# Patient Record
Sex: Female | Born: 1955
Health system: Southern US, Community
[De-identification: ages and names within clinical notes are randomized; demographics above are authoritative.]

## PROBLEM LIST (undated history)

## (undated) DIAGNOSIS — E119 Type 2 diabetes mellitus without complications: Secondary | ICD-10-CM

## (undated) DIAGNOSIS — M1990 Unspecified osteoarthritis, unspecified site: Secondary | ICD-10-CM

## (undated) DIAGNOSIS — I10 Essential (primary) hypertension: Secondary | ICD-10-CM

## (undated) DIAGNOSIS — R51 Headache: Secondary | ICD-10-CM

## (undated) DIAGNOSIS — K219 Gastro-esophageal reflux disease without esophagitis: Secondary | ICD-10-CM

## (undated) DIAGNOSIS — R519 Headache, unspecified: Secondary | ICD-10-CM

## (undated) HISTORY — PX: ABDOMINAL HYSTERECTOMY: SHX81

## (undated) HISTORY — DX: Type 2 diabetes mellitus without complications: E11.9

## (undated) HISTORY — PX: BLEPHAROPLASTY: SUR158

## (undated) HISTORY — DX: Gastro-esophageal reflux disease without esophagitis: K21.9

## (undated) HISTORY — PX: HEMORRHOID SURGERY: SHX153

## (undated) HISTORY — DX: Essential (primary) hypertension: I10

---

## 1999-08-30 ENCOUNTER — Other Ambulatory Visit: Admission: RE | Admit: 1999-08-30 | Discharge: 1999-08-30 | Payer: Self-pay | Admitting: Family Medicine

## 2003-06-20 ENCOUNTER — Encounter: Payer: Self-pay | Admitting: Emergency Medicine

## 2003-06-20 ENCOUNTER — Emergency Department (HOSPITAL_COMMUNITY): Admission: EM | Admit: 2003-06-20 | Discharge: 2003-06-20 | Payer: Self-pay | Admitting: Emergency Medicine

## 2003-08-30 ENCOUNTER — Encounter (HOSPITAL_COMMUNITY): Admission: RE | Admit: 2003-08-30 | Discharge: 2003-09-29 | Payer: Self-pay | Admitting: Preventative Medicine

## 2003-12-01 ENCOUNTER — Ambulatory Visit (HOSPITAL_COMMUNITY): Admission: RE | Admit: 2003-12-01 | Discharge: 2003-12-01 | Payer: Self-pay | Admitting: Preventative Medicine

## 2006-01-22 ENCOUNTER — Other Ambulatory Visit: Admission: RE | Admit: 2006-01-22 | Discharge: 2006-01-22 | Payer: Self-pay | Admitting: Family Medicine

## 2012-09-09 LAB — HEMOGLOBIN A1C: Hgb A1c MFr Bld: 6 % (ref 4.0–6.0)

## 2012-09-09 LAB — LIPID PANEL
Cholesterol: 201 mg/dL — AB (ref 0–200)
HDL: 57 mg/dL (ref 35–70)
LDL Cholesterol: 128 mg/dL
Triglycerides: 82 mg/dL (ref 40–160)

## 2012-09-09 LAB — BASIC METABOLIC PANEL
BUN: 13 mg/dL (ref 4–21)
Creatinine: 0.9 mg/dL (ref 0.5–1.1)

## 2012-10-04 ENCOUNTER — Encounter: Payer: Self-pay | Admitting: Family Medicine

## 2012-10-04 ENCOUNTER — Ambulatory Visit (INDEPENDENT_AMBULATORY_CARE_PROVIDER_SITE_OTHER): Payer: 59 | Admitting: Family Medicine

## 2012-10-04 VITALS — BP 130/84 | HR 74 | Resp 15 | Ht 60.0 in | Wt 159.1 lb

## 2012-10-04 DIAGNOSIS — I10 Essential (primary) hypertension: Secondary | ICD-10-CM | POA: Insufficient documentation

## 2012-10-04 DIAGNOSIS — E119 Type 2 diabetes mellitus without complications: Secondary | ICD-10-CM

## 2012-10-04 DIAGNOSIS — Z720 Tobacco use: Secondary | ICD-10-CM | POA: Insufficient documentation

## 2012-10-04 DIAGNOSIS — E669 Obesity, unspecified: Secondary | ICD-10-CM

## 2012-10-04 DIAGNOSIS — F17200 Nicotine dependence, unspecified, uncomplicated: Secondary | ICD-10-CM

## 2012-10-04 DIAGNOSIS — E1169 Type 2 diabetes mellitus with other specified complication: Secondary | ICD-10-CM | POA: Insufficient documentation

## 2012-10-04 NOTE — Assessment & Plan Note (Signed)
Continue oral meds, obtain recent labs and records, note not on ACEI or statin therapy

## 2012-10-04 NOTE — Assessment & Plan Note (Signed)
Discussed use of medications vs patches, need for cessation

## 2012-10-04 NOTE — Assessment & Plan Note (Signed)
BP looks good today, no change to meds 

## 2012-10-04 NOTE — Patient Instructions (Addendum)
Calcium 1000mg  and Vit D 800IU Continue current medications I recommend 30 minutes of exercise 5 days a week Call and get your Mammogram  I will get records  Podiatry referral  F/U 3 months

## 2012-10-04 NOTE — Progress Notes (Signed)
  Subjective:    Patient ID: Danielle Hicks, female    DOB: 01/20/1956, 56 y.o.   MRN: [id number]  HPI Pt here to establish care previous PCP Dr. Ocie Bob Medications and history reviewed DM for approx 7 years, on oral medication, had labs done 3 weeks ago, does not know A1C, states well controlled HTN- for past 15 ears on medication for about 10 years, well controlled per report No hyperlipidemia, Heart disease, or lung disease Due for Mammogram at [facility] in [place] +colonoscopy,,, +flu shot States previous PCP would not complete FMLA papers because she is "fatigued" works at [employer]  Review of Systems GEN- denies fatigue, fever, weight loss,weakness, recent illness HEENT- denies eye drainage, change in vision, nasal discharge, CVS- denies chest pain, palpitations RESP- denies SOB, cough, wheeze ABD- denies N/V, change in stools, abd pain GU- denies dysuria, hematuria, dribbling, incontinence MSK- denies joint pain, muscle aches, injury Neuro- denies headache, dizziness, syncope, seizure activity      Objective:   Physical Exam GEN- NAD, alert and oriented x3 HEENT- PERRL, EOMI, non injected sclera, pink conjunctiva, MMM, oropharynx clear Neck- Supple,  CVS- RRR, no murmur RESP-CTAB ABS-NABS,soft,NT,ND  EXT- No edema Pulses- Radial, DP- 2+ Diabetic foot- thickned nails, dry peeling feet, normal monofilament  Psych-normal affect and mood        Assessment & Plan:

## 2012-10-11 ENCOUNTER — Encounter: Payer: Self-pay | Admitting: Family Medicine

## 2012-12-29 ENCOUNTER — Ambulatory Visit: Payer: 59 | Admitting: Family Medicine

## 2012-12-31 ENCOUNTER — Encounter: Payer: Self-pay | Admitting: Family Medicine

## 2012-12-31 ENCOUNTER — Other Ambulatory Visit: Payer: Self-pay | Admitting: Family Medicine

## 2012-12-31 ENCOUNTER — Ambulatory Visit (INDEPENDENT_AMBULATORY_CARE_PROVIDER_SITE_OTHER): Payer: 59 | Admitting: Family Medicine

## 2012-12-31 VITALS — BP 130/88 | HR 65 | Resp 16 | Wt 144.4 lb

## 2012-12-31 DIAGNOSIS — F339 Major depressive disorder, recurrent, unspecified: Secondary | ICD-10-CM

## 2012-12-31 DIAGNOSIS — E119 Type 2 diabetes mellitus without complications: Secondary | ICD-10-CM

## 2012-12-31 MED ORDER — ZOLPIDEM TARTRATE 10 MG PO TABS: 10.0000 mg | ORAL_TABLET | Freq: Every evening | ORAL | Status: DC | PRN

## 2012-12-31 MED ORDER — ESCITALOPRAM OXALATE 10 MG PO TABS: 10.0000 mg | ORAL_TABLET | Freq: Every day | ORAL | Status: DC

## 2012-12-31 NOTE — Patient Instructions (Signed)
Start the lexapro once a day  Use the Ambien at bedtime as needed, take 1 hour before sleep  F/U 4 weeks

## 2012-12-31 NOTE — Progress Notes (Signed)
  Subjective:    Patient ID: Danielle Hicks, female    DOB: [birth year], 57 y.o.   MRN: [id number]  HPI  Patient presents secondary to depression. She is history depression on and off in the past was on Lexapro because she had difficulty dealing with some situations at work. She weaned herself off some time ago. She presents today with a very unique situation. She is history of chronic bags beneath her eyes which appear to have been a family trait she decided to go ahead and have the surgery to have this corrected this was approximately 6 weeks ago. Since then she realized that she would have to return to work and she was fearful of what others would think about her because she had surgery to correct this problem. She's had sleepless nights anxiety and is felt very depressed she has lost 15 pounds due to not eating. She is worried that people think that she is pain and that is why she had it done she actually now wants to come back because they were a security blanket for her low self-esteem and she has something to blame it on. He started seeing a therapist on her own in [place] she's had 2 visits and they're helping her with herself was seen Review of Systems  GEN- denies fatigue, fever, weight loss,weakness, recent illness HEENT- denies eye drainage, change in vision, nasal discharge, CVS- denies chest pain, palpitations RESP- denies SOB, cough, wheeze ABD- denies N/V, change in stools, abd pain GU- denies dysuria, hematuria, dribbling, incontinence MSK- denies joint pain, muscle aches, injury Neuro- denies headache, dizziness, syncope, seizure activity      Objective:   Physical Exam  GEN-NAD,alert and oriented x 3, + 15lb weight loss Psych- not overly anxious or depressed, good eye contact, No Hallucinations, no SI  [id number]    Assessment & Plan:

## 2013-01-02 DIAGNOSIS — F339 Major depressive disorder, recurrent, unspecified: Secondary | ICD-10-CM | POA: Insufficient documentation

## 2013-01-02 NOTE — Assessment & Plan Note (Signed)
Will restart lexapro 10mg , contracted for safety, following with therapy for her self esteem and coping mechanisms F/U per below  Approx 20 minutes spent with patient > 50% spent on counseling and medication

## 2013-01-04 ENCOUNTER — Ambulatory Visit: Payer: 59 | Admitting: Family Medicine

## 2013-01-04 LAB — BASIC METABOLIC PANEL
BUN: 6 mg/dL (ref 6–23)
CO2: 29 mEq/L (ref 19–32)
Calcium: 9.7 mg/dL (ref 8.4–10.5)
Chloride: 103 mEq/L (ref 96–112)
Creat: 0.85 mg/dL (ref 0.50–1.10)
Glucose, Bld: 99 mg/dL (ref 70–99)
Potassium: 3.3 mEq/L — ABNORMAL LOW (ref 3.5–5.3)
Sodium: 143 mEq/L (ref 135–145)

## 2013-01-04 LAB — HEMOGLOBIN A1C
Hgb A1c MFr Bld: 6.2 % — ABNORMAL HIGH (ref <5.7)
Mean Plasma Glucose: 131 mg/dL — ABNORMAL HIGH (ref <117)

## 2013-01-06 ENCOUNTER — Encounter: Payer: Self-pay | Admitting: Family Medicine

## 2013-01-06 ENCOUNTER — Ambulatory Visit (INDEPENDENT_AMBULATORY_CARE_PROVIDER_SITE_OTHER): Payer: 59 | Admitting: Family Medicine

## 2013-01-06 VITALS — BP 130/76 | HR 84 | Resp 18 | Ht 60.0 in | Wt 146.1 lb

## 2013-01-06 DIAGNOSIS — N3000 Acute cystitis without hematuria: Secondary | ICD-10-CM

## 2013-01-06 DIAGNOSIS — N390 Urinary tract infection, site not specified: Secondary | ICD-10-CM

## 2013-01-06 DIAGNOSIS — R252 Cramp and spasm: Secondary | ICD-10-CM

## 2013-01-06 LAB — POCT URINALYSIS DIPSTICK
Blood, UA: NEGATIVE
Glucose, UA: NEGATIVE
Leukocytes, UA: NEGATIVE
Nitrite, UA: NEGATIVE
Protein, UA: NEGATIVE
Spec Grav, UA: 1.025
Urobilinogen, UA: 0.2
pH, UA: 6.5

## 2013-01-06 MED ORDER — CYCLOBENZAPRINE HCL 10 MG PO TABS: 10.0000 mg | ORAL_TABLET | Freq: Every evening | ORAL | Status: DC | PRN

## 2013-01-06 MED ORDER — CEPHALEXIN 500 MG PO CAPS: 500.0000 mg | ORAL_CAPSULE | Freq: Two times a day (BID) | ORAL | Status: AC

## 2013-01-06 NOTE — Patient Instructions (Addendum)
Take the antibiotic twice a day for urine infection Water and cranberry juice Take the aleve twice a day  F/U as previous

## 2013-01-06 NOTE — Progress Notes (Signed)
  Subjective:    Patient ID: Danielle Hicks, female    DOB: 07/01/1956, 57 y.o.   MRN: [id number]  HPI  History presents with for ER followup she was seen in the ER about a week ago secondary to some numbness she felt on her right lateral side it went away until last night he came back with numbness and some spasms in her leg. She denies any change in bowel or bladder or any injury. She's also noticed that she's had urinary frequency and burning with urination for the past few days. She does murmur her symptoms and her leg started after working out heart on the elliptical Review of Systems - per above  GEN- denies fatigue, fever, weight loss,weakness, recent illness HEENT- denies eye drainage, change in vision, nasal discharge, CVS- denies chest pain, palpitations RESP- denies SOB, cough, wheeze ABD- denies N/V, change in stools, abd pain GU- + dysuria, hematuria, dribbling, incontinence MSK- + joint pain, muscle aches, injury Neuro- denies headache, dizziness, syncope, seizure activity      Objective:   Physical Exam GEN- NAD, alert and oriented x3 CVS- RRR, no murmur RESP-CTAB ABD-NABS,soft,NT,ND, no CVA tenderness EXT- No edema Pulses- Radial, DP- 2+ MSK- normal HIP rotation, spine NT, neg SLR, no spasm Skin- Thighs-normal Neuro- sensation grossly in tact, motor equal bilaterl LE       Assessment & Plan:

## 2013-01-07 DIAGNOSIS — N390 Urinary tract infection, site not specified: Secondary | ICD-10-CM | POA: Insufficient documentation

## 2013-01-07 DIAGNOSIS — R252 Cramp and spasm: Secondary | ICD-10-CM | POA: Insufficient documentation

## 2013-01-07 MED ORDER — METFORMIN HCL 500 MG PO TABS: 500.0000 mg | ORAL_TABLET | Freq: Two times a day (BID) | ORAL | Status: DC

## 2013-01-07 NOTE — Addendum Note (Signed)
Addended by: Milinda Antis F on: 01/07/2013 08:59 AM   Modules accepted: Orders

## 2013-01-07 NOTE — Assessment & Plan Note (Signed)
Multiple symptoms with a very small area of numbness as well as cramping and spasms after she had started working out. I think this is all muscle skeletal pain she has no signs of neuropathy no back pain. Will have her use muscle relaxants and some anti-inflammatories

## 2013-01-07 NOTE — Assessment & Plan Note (Signed)
We'll treat with antibiotics fluids cranberry juice

## 2013-02-04 ENCOUNTER — Ambulatory Visit: Payer: 59 | Admitting: Family Medicine

## 2013-03-23 ENCOUNTER — Other Ambulatory Visit: Payer: Self-pay | Admitting: Family Medicine

## 2013-03-25 ENCOUNTER — Other Ambulatory Visit: Payer: Self-pay

## 2013-03-25 MED ORDER — AMLODIPINE BESYLATE 10 MG PO TABS: 10.0000 mg | ORAL_TABLET | Freq: Every day | ORAL | Status: DC

## 2013-03-25 MED ORDER — HYDROCHLOROTHIAZIDE 25 MG PO TABS: 25.0000 mg | ORAL_TABLET | Freq: Every day | ORAL | Status: DC

## 2013-06-08 ENCOUNTER — Other Ambulatory Visit: Payer: Self-pay | Admitting: Family Medicine

## 2013-06-09 NOTE — Telephone Encounter (Signed)
Meds refilled.

## 2013-07-04 ENCOUNTER — Telehealth: Payer: Self-pay | Admitting: Family Medicine

## 2013-07-04 MED ORDER — SITAGLIPTIN PHOS-METFORMIN HCL 50-500 MG PO TABS: ORAL_TABLET | ORAL | Status: DC

## 2013-07-04 NOTE — Telephone Encounter (Signed)
Rx Refilled  

## 2013-07-06 ENCOUNTER — Ambulatory Visit (INDEPENDENT_AMBULATORY_CARE_PROVIDER_SITE_OTHER): Payer: 59 | Admitting: Family Medicine

## 2013-07-06 ENCOUNTER — Encounter: Payer: Self-pay | Admitting: Family Medicine

## 2013-07-06 VITALS — BP 120/80 | HR 80 | Temp 99.3°F | Resp 20 | Wt 148.0 lb

## 2013-07-06 DIAGNOSIS — F339 Major depressive disorder, recurrent, unspecified: Secondary | ICD-10-CM

## 2013-07-06 DIAGNOSIS — F17200 Nicotine dependence, unspecified, uncomplicated: Secondary | ICD-10-CM

## 2013-07-06 DIAGNOSIS — E119 Type 2 diabetes mellitus without complications: Secondary | ICD-10-CM

## 2013-07-06 DIAGNOSIS — Z720 Tobacco use: Secondary | ICD-10-CM

## 2013-07-06 DIAGNOSIS — I10 Essential (primary) hypertension: Secondary | ICD-10-CM

## 2013-07-06 DIAGNOSIS — B353 Tinea pedis: Secondary | ICD-10-CM

## 2013-07-06 LAB — CBC
HCT: 36.3 % (ref 36.0–46.0)
Hemoglobin: 12.2 g/dL (ref 12.0–15.0)
MCH: 26.3 pg (ref 26.0–34.0)
MCHC: 33.6 g/dL (ref 30.0–36.0)
MCV: 78.2 fL (ref 78.0–100.0)
Platelets: 341 10*3/uL (ref 150–400)
RBC: 4.64 MIL/uL (ref 3.87–5.11)
RDW: 17.2 % — ABNORMAL HIGH (ref 11.5–15.5)
WBC: 8.7 10*3/uL (ref 4.0–10.5)

## 2013-07-06 LAB — BASIC METABOLIC PANEL
BUN: 15 mg/dL (ref 6–23)
CO2: 27 mEq/L (ref 19–32)
Calcium: 9.3 mg/dL (ref 8.4–10.5)
Chloride: 105 mEq/L (ref 96–112)
Creat: 0.77 mg/dL (ref 0.50–1.10)
Glucose, Bld: 72 mg/dL (ref 70–99)
Potassium: 3.6 mEq/L (ref 3.5–5.3)
Sodium: 140 mEq/L (ref 135–145)

## 2013-07-06 LAB — HEMOGLOBIN A1C, FINGERSTICK: Hgb A1C (fingerstick): 5.7 % (ref <5.7)

## 2013-07-06 MED ORDER — CLOTRIMAZOLE-BETAMETHASONE 1-0.05 % EX CREA: TOPICAL_CREAM | Freq: Two times a day (BID) | CUTANEOUS | Status: DC

## 2013-07-06 MED ORDER — METFORMIN HCL ER 500 MG PO TB24: 500.0000 mg | ORAL_TABLET | Freq: Every day | ORAL | Status: DC

## 2013-07-06 MED ORDER — LISINOPRIL-HYDROCHLOROTHIAZIDE 10-12.5 MG PO TABS: 1.0000 | ORAL_TABLET | Freq: Every day | ORAL | Status: DC

## 2013-07-06 MED ORDER — AMLODIPINE BESYLATE 10 MG PO TABS: 10.0000 mg | ORAL_TABLET | Freq: Every day | ORAL | Status: DC

## 2013-07-06 MED ORDER — LEXAPRO 10 MG PO TABS: 10.0000 mg | ORAL_TABLET | Freq: Every day | ORAL | Status: DC

## 2013-07-06 NOTE — Assessment & Plan Note (Signed)
She is cutting back, continue to work on cessation

## 2013-07-06 NOTE — Progress Notes (Signed)
  Subjective:    Patient ID: Danielle Hicks, female    DOB: [birth year], 57 y.o.   MRN: [id number]  HPI  Pt here to f/u chronic medical problems.  Rash on bilateral feet for past month or so, began after getting water in her work boots, has used cortisone for itching but has not cleared the rash.  DM- taking Janumet, did not know she was to be on Metformin instead, last A1C 6.2%, has [place] appt with podiatry Depression- continues to have difficulty with recognizing herself since her bags were removed from her eyes, she wants to try lexapro again, at the time she started had sleeping pill and muscle relaxants and was very drowsy and dizzy, she was not sure which med was causing the problem so stopped all of them. Only took 1 week of medications. Asked for Brand Name Lexapro. She is also going to start therapy again.    Review of Systems   GEN- denies fatigue, fever, weight loss,weakness, recent illness HEENT- denies eye drainage, change in vision, nasal discharge, CVS- denies chest pain, palpitations RESP- denies SOB, cough, wheeze ABD- denies N/V, change in stools, abd pain GU- denies dysuria, hematuria, dribbling, incontinence MSK- denies joint pain, muscle aches, injury Neuro- denies headache, dizziness, syncope, seizure activity      Objective:   Physical Exam GEN- NAD, alert and oriented x3 HEENT- PERRL, EOMI, non injected sclera, pink conjunctiva, MMM, oropharynx clear Neck- Supple,  CVS- RRR, no murmur RESP-CTAB ABD-NABS,soft,NT,ND EXT- No edema Pulses- Radial, DP- 2+ Skin- erythematous macular rash bilteral feet with flaking areas, no lesions in web spaces Psych- normal affect and mood        Assessment & Plan:

## 2013-07-06 NOTE — Assessment & Plan Note (Signed)
lotrisone BID

## 2013-07-06 NOTE — Patient Instructions (Addendum)
Start lisinopril HCTZ- after you run out of your regular HCTZ 25mg  Continue norvasc  Lexapro sent as Brand Name Call and schedule appt with therapist Stop Janumet Take Metformin 1 tablet once a day  Apply cream to foot twice a day  F/U 6 weeks

## 2013-07-06 NOTE — Assessment & Plan Note (Signed)
Lexpro to be restarted, she also has list of therapist she received from work

## 2013-07-06 NOTE — Assessment & Plan Note (Signed)
Overcorrected, she  Never received the change to plain metformin Will d/c janumet Metformin 500MG  ER daily Urine Micro Check renal function

## 2013-07-06 NOTE — Assessment & Plan Note (Signed)
Well controllled, will D/C HCTZ, start lisinopril HCTZ, needs ACEI

## 2013-07-07 ENCOUNTER — Telehealth: Payer: Self-pay | Admitting: Family Medicine

## 2013-07-07 NOTE — Telephone Encounter (Signed)
Called pharmacy and asked them to change to generic name of Lexapro

## 2013-07-07 NOTE — Telephone Encounter (Signed)
Name brand lexapro cost the pt $168. Do you really want name brand?

## 2013-07-15 ENCOUNTER — Telehealth: Payer: Self-pay | Admitting: Family Medicine

## 2013-07-15 NOTE — Telephone Encounter (Signed)
Called pt and she was concerned if she should take her BPmeds with her HCTZ, the med was lisinopril with the HCTZ in it. Told pt it was ok to take.

## 2013-07-21 ENCOUNTER — Other Ambulatory Visit: Payer: Self-pay | Admitting: Family Medicine

## 2013-07-21 NOTE — Telephone Encounter (Signed)
Medication refilled per protocol. 

## 2013-08-17 ENCOUNTER — Ambulatory Visit: Payer: 59 | Admitting: Family Medicine

## 2013-11-24 ENCOUNTER — Telehealth: Payer: Self-pay | Admitting: *Deleted

## 2013-11-24 NOTE — Telephone Encounter (Signed)
Pt is due for office visit, left message for pt to return my call to schedule appt.

## 2013-11-29 ENCOUNTER — Ambulatory Visit: Payer: 59 | Admitting: Family Medicine

## 2013-12-01 ENCOUNTER — Other Ambulatory Visit: Payer: Self-pay | Admitting: *Deleted

## 2013-12-01 MED ORDER — LISINOPRIL-HYDROCHLOROTHIAZIDE 10-12.5 MG PO TABS
1.0000 | ORAL_TABLET | Freq: Every day | ORAL | Status: DC
Start: 2013-12-01 — End: 2013-12-12

## 2013-12-01 MED ORDER — METFORMIN HCL ER 500 MG PO TB24: 500.0000 mg | ORAL_TABLET | Freq: Every day | ORAL | Status: DC

## 2013-12-01 NOTE — Telephone Encounter (Signed)
REFILLED 30 days only, pt needs to be seen, no further refills until seen

## 2013-12-12 ENCOUNTER — Ambulatory Visit (INDEPENDENT_AMBULATORY_CARE_PROVIDER_SITE_OTHER): Payer: 59 | Admitting: Family Medicine

## 2013-12-12 VITALS — BP 120/80 | HR 80 | Temp 98.1°F | Resp 20 | Ht 61.0 in | Wt 158.0 lb

## 2013-12-12 DIAGNOSIS — Z23 Encounter for immunization: Secondary | ICD-10-CM

## 2013-12-12 DIAGNOSIS — E119 Type 2 diabetes mellitus without complications: Secondary | ICD-10-CM

## 2013-12-12 DIAGNOSIS — M546 Pain in thoracic spine: Secondary | ICD-10-CM

## 2013-12-12 DIAGNOSIS — I10 Essential (primary) hypertension: Secondary | ICD-10-CM

## 2013-12-12 DIAGNOSIS — F339 Major depressive disorder, recurrent, unspecified: Secondary | ICD-10-CM

## 2013-12-12 MED ORDER — AMLODIPINE BESYLATE 10 MG PO TABS: 10.0000 mg | ORAL_TABLET | Freq: Every day | ORAL | Status: DC

## 2013-12-12 MED ORDER — LISINOPRIL-HYDROCHLOROTHIAZIDE 10-12.5 MG PO TABS: 1.0000 | ORAL_TABLET | Freq: Every day | ORAL | Status: DC

## 2013-12-12 MED ORDER — LEXAPRO 10 MG PO TABS: 10.0000 mg | ORAL_TABLET | Freq: Every day | ORAL | Status: DC

## 2013-12-12 NOTE — Patient Instructions (Addendum)
Get labs done on Saturday We will send letter if normal Schedule your mammogram Schedule your eye doctor appt  Pneumonia shot given F/U 3 months

## 2013-12-12 NOTE — Progress Notes (Signed)
   Subjective:    Patient ID: Danielle Hicks, female    DOB: [birth year], 58 y.o.   MRN: [id number]  HPI  Pt here to f/u chronic medical problems. Mid back pain and neck pain for past couple weeks, thinks she slept wrong, it is already improving, no injury, no low back pain, no paresthesia LE.  DM- CBG fasting < 150, fasting, no hypoglycemia, last A1C < 6%. No hypoglycemia symptoms, no polyuria, Due for pneumonia vaccine HTN- taking medications as prescribed   MDD- has been off lexapro past month, thinks she has doing well overall, but still gets anxious and self concious and feels better on meds. Completed therapy. Sleeping well Review of Systems   GEN- denies fatigue, fever, weight loss,weakness, recent illness HEENT- denies eye drainage, change in vision, nasal discharge, CVS- denies chest pain, palpitations RESP- denies SOB, cough, wheeze ABD- denies N/V, change in stools, abd pain GU- denies dysuria, hematuria, dribbling, incontinence MSK- + joint pain, muscle aches, injury Neuro- denies headache, dizziness, syncope, seizure activity      Objective:   Physical Exam  GEN- NAD, alert and oriented x3 HEENT- PERRL, EOMI, non injected sclera, pink conjunctiva, MMM, oropharynx clear Neck- Supple, FROM,  CVS- RRR, 2/6 SEM RESP-CTAB ABD-NABS,soft,NT,ND EXT- No edema MSK- Spine NT, mild tenderness, Right thoracic paraspinals, no lumbar spine tenderness, neg SLR, FROM Back  Pulses- Radial, DP- 2+ Psych- normal affect and mood       Assessment & Plan:

## 2013-12-13 ENCOUNTER — Encounter: Payer: Self-pay | Admitting: Family Medicine

## 2013-12-13 DIAGNOSIS — M546 Pain in thoracic spine: Secondary | ICD-10-CM | POA: Insufficient documentation

## 2013-12-13 LAB — MICROALBUMIN / CREATININE URINE RATIO
Creatinine, Urine: 147.9 mg/dL
Microalb Creat Ratio: 6.6 mg/g (ref 0.0–30.0)
Microalb, Ur: 0.97 mg/dL (ref 0.00–1.89)

## 2013-12-13 NOTE — Assessment & Plan Note (Addendum)
Plan for fasting labs on Saturday Previously well controlled On ACEI Check Urine Micro No statin drug currently

## 2013-12-13 NOTE — Assessment & Plan Note (Signed)
Will plan to restart lexapro to help more anxiety as well, overall I think she is doing very well

## 2013-12-13 NOTE — Assessment & Plan Note (Signed)
Well-controlled, continue current medications

## 2013-12-13 NOTE — Assessment & Plan Note (Signed)
MSK pain already improving, no new meds needed Heating pad, NSAID

## 2013-12-17 LAB — HEMOGLOBIN A1C
Hgb A1c MFr Bld: 6.1 % — ABNORMAL HIGH (ref <5.7)
Mean Plasma Glucose: 128 mg/dL — ABNORMAL HIGH (ref <117)

## 2013-12-17 LAB — LIPID PANEL
Cholesterol: 182 mg/dL (ref 0–200)
HDL: 60 mg/dL (ref >39)
LDL Cholesterol: 109 mg/dL — ABNORMAL HIGH (ref 0–99)
Total CHOL/HDL Ratio: 3 Ratio
Triglycerides: 67 mg/dL (ref <150)
VLDL: 13 mg/dL (ref 0–40)

## 2013-12-17 LAB — COMPREHENSIVE METABOLIC PANEL
ALT: 12 U/L (ref 0–35)
AST: 14 U/L (ref 0–37)
Albumin: 3.7 g/dL (ref 3.5–5.2)
Alkaline Phosphatase: 66 U/L (ref 39–117)
BUN: 10 mg/dL (ref 6–23)
CO2: 31 mEq/L (ref 19–32)
Calcium: 9.4 mg/dL (ref 8.4–10.5)
Chloride: 103 mEq/L (ref 96–112)
Creat: 0.74 mg/dL (ref 0.50–1.10)
Glucose, Bld: 87 mg/dL (ref 70–99)
Potassium: 4.5 mEq/L (ref 3.5–5.3)
Sodium: 139 mEq/L (ref 135–145)
Total Bilirubin: 0.5 mg/dL (ref 0.2–1.2)
Total Protein: 6.4 g/dL (ref 6.0–8.3)

## 2013-12-19 MED ORDER — PRAVASTATIN SODIUM 10 MG PO TABS: 10.0000 mg | ORAL_TABLET | Freq: Every day | ORAL | Status: DC

## 2013-12-19 MED ORDER — PRAVASTATIN SODIUM 10 MG PO TABS: 20.0000 mg | ORAL_TABLET | Freq: Every day | ORAL | Status: DC

## 2013-12-19 NOTE — Addendum Note (Signed)
Addended by: Vic Blackbird F on: 12/19/2013 04:42 PM   Modules accepted: Orders

## 2014-01-02 ENCOUNTER — Other Ambulatory Visit: Payer: Self-pay | Admitting: *Deleted

## 2014-01-02 ENCOUNTER — Telehealth: Payer: Self-pay | Admitting: *Deleted

## 2014-01-02 ENCOUNTER — Telehealth: Payer: Self-pay | Admitting: Family Medicine

## 2014-01-02 MED ORDER — METFORMIN HCL ER 500 MG PO TB24: 500.0000 mg | ORAL_TABLET | Freq: Every day | ORAL | Status: DC

## 2014-01-02 NOTE — Telephone Encounter (Signed)
Call back number is [place] Pt is needing refill on her metFORMIN (GLUCOPHAGE XR) 500 MG 24 hr tablet She is completely out and has not taken on today

## 2014-01-02 NOTE — Telephone Encounter (Signed)
Refill appropriate and filled per protocol. 

## 2014-01-02 NOTE — Telephone Encounter (Signed)
Med DENIED, tried calling pt no answer

## 2014-01-03 NOTE — Telephone Encounter (Signed)
Medication sent over on 01/02/14

## 2014-03-13 ENCOUNTER — Ambulatory Visit: Payer: 59 | Admitting: Family Medicine

## 2014-04-19 ENCOUNTER — Other Ambulatory Visit: Payer: Self-pay | Admitting: *Deleted

## 2014-04-19 MED ORDER — PRAVASTATIN SODIUM 10 MG PO TABS: 10.0000 mg | ORAL_TABLET | Freq: Every day | ORAL | Status: DC

## 2014-04-19 NOTE — Telephone Encounter (Signed)
Refill appropriate and filled per protocol. 

## 2014-05-01 ENCOUNTER — Other Ambulatory Visit: Payer: Self-pay | Admitting: *Deleted

## 2014-05-01 MED ORDER — METFORMIN HCL ER 500 MG PO TB24: 500.0000 mg | ORAL_TABLET | Freq: Every day | ORAL | Status: DC

## 2014-05-01 NOTE — Telephone Encounter (Signed)
Refill appropriate and filled per protocol. 

## 2014-05-20 ENCOUNTER — Other Ambulatory Visit: Payer: Self-pay | Admitting: *Deleted

## 2014-05-20 DIAGNOSIS — I10 Essential (primary) hypertension: Secondary | ICD-10-CM

## 2014-05-20 DIAGNOSIS — E785 Hyperlipidemia, unspecified: Secondary | ICD-10-CM

## 2014-05-20 DIAGNOSIS — E119 Type 2 diabetes mellitus without complications: Secondary | ICD-10-CM

## 2014-06-03 ENCOUNTER — Other Ambulatory Visit: Payer: Self-pay | Admitting: Family Medicine

## 2014-06-03 LAB — COMPLETE METABOLIC PANEL WITH GFR
ALT: 9 U/L (ref 0–35)
AST: 12 U/L (ref 0–37)
Albumin: 3.7 g/dL (ref 3.5–5.2)
Alkaline Phosphatase: 59 U/L (ref 39–117)
BUN: 10 mg/dL (ref 6–23)
CO2: 27 mEq/L (ref 19–32)
Calcium: 9.1 mg/dL (ref 8.4–10.5)
Chloride: 105 mEq/L (ref 96–112)
Creat: 0.75 mg/dL (ref 0.50–1.10)
GFR, Est African American: >89 mL/min
GFR, Est Non African American: 89 mL/min
Glucose, Bld: 85 mg/dL (ref 70–99)
Potassium: 4.4 mEq/L (ref 3.5–5.3)
Sodium: 139 mEq/L (ref 135–145)
Total Bilirubin: 0.6 mg/dL (ref 0.2–1.2)
Total Protein: 6.4 g/dL (ref 6.0–8.3)

## 2014-06-03 LAB — CBC WITH DIFFERENTIAL/PLATELET
Basophils Absolute: 0 10*3/uL (ref 0.0–0.1)
Basophils Relative: 0 % (ref 0–1)
Eosinophils Absolute: 0.2 10*3/uL (ref 0.0–0.7)
Eosinophils Relative: 3 % (ref 0–5)
HCT: 37.3 % (ref 36.0–46.0)
Hemoglobin: 12.4 g/dL (ref 12.0–15.0)
Lymphocytes Relative: 41 % (ref 12–46)
Lymphs Abs: 3.2 10*3/uL (ref 0.7–4.0)
MCH: 26.2 pg (ref 26.0–34.0)
MCHC: 33.2 g/dL (ref 30.0–36.0)
MCV: 78.9 fL (ref 78.0–100.0)
Monocytes Absolute: 0.6 10*3/uL (ref 0.1–1.0)
Monocytes Relative: 7 % (ref 3–12)
Neutro Abs: 3.9 10*3/uL (ref 1.7–7.7)
Neutrophils Relative %: 49 % (ref 43–77)
Platelets: 337 10*3/uL (ref 150–400)
RBC: 4.73 MIL/uL (ref 3.87–5.11)
RDW: 15.8 % — ABNORMAL HIGH (ref 11.5–15.5)
WBC: 7.9 10*3/uL (ref 4.0–10.5)

## 2014-06-03 LAB — LIPID PANEL
Cholesterol: 157 mg/dL (ref 0–200)
HDL: 56 mg/dL (ref >39)
LDL Cholesterol: 91 mg/dL (ref 0–99)
Total CHOL/HDL Ratio: 2.8 Ratio
Triglycerides: 51 mg/dL (ref <150)
VLDL: 10 mg/dL (ref 0–40)

## 2014-06-03 LAB — HEMOGLOBIN A1C
Hgb A1c MFr Bld: 6.2 % — ABNORMAL HIGH (ref <5.7)
Mean Plasma Glucose: 131 mg/dL — ABNORMAL HIGH (ref <117)

## 2014-06-05 ENCOUNTER — Encounter: Payer: Self-pay | Admitting: Family Medicine

## 2014-06-05 ENCOUNTER — Ambulatory Visit (INDEPENDENT_AMBULATORY_CARE_PROVIDER_SITE_OTHER): Payer: BC Managed Care – PPO | Admitting: Family Medicine

## 2014-06-05 VITALS — BP 130/76 | HR 68 | Temp 98.8°F | Resp 12 | Ht 61.0 in | Wt 157.0 lb

## 2014-06-05 DIAGNOSIS — F339 Major depressive disorder, recurrent, unspecified: Secondary | ICD-10-CM

## 2014-06-05 DIAGNOSIS — I10 Essential (primary) hypertension: Secondary | ICD-10-CM

## 2014-06-05 DIAGNOSIS — E119 Type 2 diabetes mellitus without complications: Secondary | ICD-10-CM

## 2014-06-05 MED ORDER — CLOTRIMAZOLE-BETAMETHASONE 1-0.05 % EX CREA: TOPICAL_CREAM | Freq: Two times a day (BID) | CUTANEOUS | Status: DC

## 2014-06-05 MED ORDER — LEXAPRO 10 MG PO TABS: 10.0000 mg | ORAL_TABLET | Freq: Every day | ORAL | Status: DC

## 2014-06-05 MED ORDER — AMLODIPINE BESYLATE 10 MG PO TABS: 10.0000 mg | ORAL_TABLET | Freq: Every day | ORAL | Status: DC

## 2014-06-05 MED ORDER — LISINOPRIL-HYDROCHLOROTHIAZIDE 10-12.5 MG PO TABS: 1.0000 | ORAL_TABLET | Freq: Every day | ORAL | Status: DC

## 2014-06-05 NOTE — Patient Instructions (Addendum)
Stop the metformin  Stop the pravstatin Try nexium  Schedule an eye apppointment  F/U 6 months

## 2014-06-05 NOTE — Progress Notes (Signed)
Patient ID: Danielle Hicks, female   DOB: 1956-08-21, 58 y.o.   MRN: [id number]   Subjective:    Patient ID: Danielle Hicks, female    DOB: [birth year], 58 y.o.   MRN: [id number]  Patient presents for F/U and Medication Review and Refill  Patient here follow chronic medical problems. She's no specific concerns. She recently had her fasting labs on her diabetes mellitus and an A1c of 6.2% she's taking metformin 500 mg once a day. She's watching her diet and trying to exercise some. Medications reviewed labs are reviewed patient in detail   No patient states that she had a colonoscopy at age 64 was told to return in 29 years I do not have any records from that she cannot recall with gastroenterology she saw Review Of Systems:  GEN- denies fatigue, fever, weight loss,weakness, recent illness HEENT- denies eye drainage, change in vision, nasal discharge, CVS- denies chest pain, palpitations RESP- denies SOB, cough, wheeze ABD- denies N/V, change in stools, abd pain GU- denies dysuria, hematuria, dribbling, incontinence MSK- denies joint pain, muscle aches, injury Neuro- denies headache, dizziness, syncope, seizure activity       Objective:    BP 130/76  Pulse 68  Temp(Src) 98.8 F (37.1 C) (Oral)  Resp 12  Ht 5\' 1"  (1.549 m)  Wt 157 lb (71.215 kg)  BMI 29.68 kg/m2 GEN- NAD, alert and oriented x3 HEENT- PERRL, EOMI, non injected sclera, pink conjunctiva, MMM, oropharynx clear CVS- RRR, no murmur RESP-CTAB EXT- No edema Pulses- Radial, DP- 2+        Assessment & Plan:      Problem List Items Addressed This Visit   None      Note: This dictation was prepared with Dragon dictation along with smaller phrase technology. Any transcriptional errors that result from this process are unintentional.

## 2014-06-06 ENCOUNTER — Encounter: Payer: Self-pay | Admitting: Family Medicine

## 2014-06-06 NOTE — Assessment & Plan Note (Signed)
She is on a very small amount of metformin and with her consistently low A1c will go ahead and discontinue the metformin as well as the pravastatin to see how she does on her own

## 2014-06-06 NOTE — Assessment & Plan Note (Signed)
Continue current medications, well controlled. 

## 2014-06-06 NOTE — Assessment & Plan Note (Signed)
Doing well on medications, no change

## 2014-09-22 LAB — HM MAMMOGRAPHY: HM Mammogram: NORMAL

## 2014-09-28 ENCOUNTER — Encounter: Payer: Self-pay | Admitting: *Deleted

## 2015-01-18 ENCOUNTER — Telehealth: Payer: Self-pay | Admitting: Family Medicine

## 2015-01-18 NOTE — Telephone Encounter (Signed)
Call placed to patient.   States that she is afraid that her CBG is becoming elevated since she is gaining weight. Reports that she has checked her FSBS 30  minutes after lunch and noted CBG reading at 145.  Advised to monitor FSBS fasting in the morning and record. Advised to bring readings with her to OV on 02/05/2015.

## 2015-01-18 NOTE — Telephone Encounter (Signed)
Patient is calling to get refill on her diabetes medication,  (i did not see this in her med list, what she was trying to explain to me) she has appt scheduled near the end of march with dr Audie Box [facility] [place]     [phone]

## 2015-01-22 NOTE — Telephone Encounter (Signed)
Will need labs, discuss at [place]

## 2015-01-31 ENCOUNTER — Encounter: Payer: Self-pay | Admitting: Family Medicine

## 2015-01-31 ENCOUNTER — Ambulatory Visit (INDEPENDENT_AMBULATORY_CARE_PROVIDER_SITE_OTHER): Payer: BLUE CROSS/BLUE SHIELD | Admitting: Family Medicine

## 2015-01-31 VITALS — BP 126/68 | HR 76 | Temp 98.5°F | Resp 16 | Ht 61.0 in | Wt 166.0 lb

## 2015-01-31 DIAGNOSIS — E119 Type 2 diabetes mellitus without complications: Secondary | ICD-10-CM | POA: Diagnosis not present

## 2015-01-31 DIAGNOSIS — K219 Gastro-esophageal reflux disease without esophagitis: Secondary | ICD-10-CM

## 2015-01-31 DIAGNOSIS — R079 Chest pain, unspecified: Secondary | ICD-10-CM

## 2015-01-31 DIAGNOSIS — L304 Erythema intertrigo: Secondary | ICD-10-CM

## 2015-01-31 DIAGNOSIS — Z720 Tobacco use: Secondary | ICD-10-CM

## 2015-01-31 DIAGNOSIS — I10 Essential (primary) hypertension: Secondary | ICD-10-CM | POA: Diagnosis not present

## 2015-01-31 MED ORDER — AMLODIPINE BESYLATE 10 MG PO TABS: 10.0000 mg | ORAL_TABLET | Freq: Every day | ORAL | Status: DC

## 2015-01-31 MED ORDER — LISINOPRIL-HYDROCHLOROTHIAZIDE 10-12.5 MG PO TABS: 1.0000 | ORAL_TABLET | Freq: Every day | ORAL | Status: DC

## 2015-01-31 MED ORDER — NYSTATIN 100000 UNIT/GM EX POWD: CUTANEOUS | Status: DC

## 2015-01-31 MED ORDER — CLOTRIMAZOLE-BETAMETHASONE 1-0.05 % EX CREA: TOPICAL_CREAM | Freq: Two times a day (BID) | CUTANEOUS | Status: DC

## 2015-01-31 NOTE — Patient Instructions (Addendum)
Continue blood pressure medications Get the labs done fasting Use cream for rash and powder Try the nexium for heartburn- F/U 6 months

## 2015-01-31 NOTE — Assessment & Plan Note (Signed)
Will treat with Lotrisone cream will then follow-up with nystatin powder to keep area dry. She also gets some areas in her groin from time to time she can use the powder there is well

## 2015-01-31 NOTE — Assessment & Plan Note (Signed)
Blood pressure well controlled at change of medication 

## 2015-01-31 NOTE — Assessment & Plan Note (Signed)
Discussed face to avoid that me because in symptoms. I've given her samples of Nexium that she can take as well

## 2015-01-31 NOTE — Progress Notes (Signed)
Patient ID: Danielle Hicks, female   DOB: 1955-12-26, 59 y.o.   MRN: [id number]   Subjective:    Patient ID: Danielle Hicks, female    DOB: [birth year], 59 y.o.   MRN: [id number]  Patient presents for Indigestion and Rash  patient here with episodes of chest pain feels more like a pressure in her chest associated with belching typically after eating spicy foods. She denies any nausea or vomiting associated no diaphoresis. She's been taking her blood pressure medicine as prescribed as not had any difficulties. She has taken a couple Tums and this helps some.  Rash beneath her breasts for the past few weeks states that it is itchy in nature and a little bit raw.  Diabetes mellitus is diet controlled she's been checking her sugars states it is still been good off of the medications  No longer on Lexapro  Review Of Systems:  GEN- denies fatigue, fever, weight loss,weakness, recent illness HEENT- denies eye drainage, change in vision, nasal discharge, CVS- +chest pain, denies palpitations RESP- denies SOB, cough, wheeze ABD- denies N/V, change in stools, abd pain GU- denies dysuria, hematuria, dribbling, incontinence MSK- denies joint pain, muscle aches, injury Neuro- denies headache, dizziness, syncope, seizure activity       Objective:    BP 126/68 mmHg  Pulse 76  Temp(Src) 98.5 F (36.9 C) (Oral)  Resp 16  Ht 5\' 1"  (1.549 m)  Wt 166 lb (75.297 kg)  BMI 31.38 kg/m2 GEN- NAD, alert and oriented x3 HEENT- PERRL, EOMI, non injected sclera, pink conjunctiva, MMM, oropharynx clear Neck- Supple, no thyromegaly CVS- RRR, no murmur RESP-CTAB ABD-NABS,soft,NT,ND Skin- erythema with maceration beneath bilat breast, extending to sternal region, no drainage, no odor EXT- No edema Pulses- Radial,  2+  EGK- NSR, no ST changes       Assessment & Plan:      Problem List Items Addressed This Visit      High   Essential hypertension, benign   Relevant Medications   amLODIpine (NORVASC) tablet   lisinopril-hydrochlorothiazide (PRINZIDE,ZESTORETIC) 10-12.5 MG per tablet   Diabetes mellitus type II, controlled   Relevant Medications   lisinopril-hydrochlorothiazide (PRINZIDE,ZESTORETIC) 10-12.5 MG per tablet   Other Relevant Orders   Microalbumin / creatinine urine ratio   Hemoglobin A1c   Lipid panel   CBC with Differential/Platelet   Comprehensive metabolic panel     Low   Tobacco use     Unprioritized   Intertrigo   GERD (gastroesophageal reflux disease)   Relevant Medications   esomeprazole (NEXIUM) 40 MG capsule    Other Visit Diagnoses    Chest pain, unspecified chest pain type    -  Primary    Aytpical CP, EKG normal, treat GI symptoms, EKG done due to concurrent HTN    Relevant Orders    EKG 12-Lead (Completed)    DM II (diabetes mellitus, type II), controlled        Relevant Medications    amLODIpine (NORVASC) tablet    lisinopril-hydrochlorothiazide (PRINZIDE,ZESTORETIC) 10-12.5 MG per tablet       Note: This dictation was prepared with Dragon dictation along with smaller Company secretary. Any transcriptional errors that result from this process are unintentional.

## 2015-01-31 NOTE — Assessment & Plan Note (Signed)
Diet control she would get fasting labs done this week

## 2015-02-17 LAB — COMPREHENSIVE METABOLIC PANEL
ALT: 13 U/L (ref 0–35)
AST: 16 U/L (ref 0–37)
Albumin: 3.8 g/dL (ref 3.5–5.2)
Alkaline Phosphatase: 72 U/L (ref 39–117)
BUN: 10 mg/dL (ref 6–23)
CO2: 28 mEq/L (ref 19–32)
Calcium: 9.1 mg/dL (ref 8.4–10.5)
Chloride: 105 mEq/L (ref 96–112)
Creat: 0.74 mg/dL (ref 0.50–1.10)
Glucose, Bld: 78 mg/dL (ref 70–99)
Potassium: 4.3 mEq/L (ref 3.5–5.3)
Sodium: 139 mEq/L (ref 135–145)
Total Bilirubin: 0.4 mg/dL (ref 0.2–1.2)
Total Protein: 6.7 g/dL (ref 6.0–8.3)

## 2015-02-17 LAB — LIPID PANEL
Cholesterol: 181 mg/dL (ref 0–200)
HDL: 58 mg/dL (ref >=46)
LDL Cholesterol: 111 mg/dL — ABNORMAL HIGH (ref 0–99)
Total CHOL/HDL Ratio: 3.1 Ratio
Triglycerides: 59 mg/dL (ref <150)
VLDL: 12 mg/dL (ref 0–40)

## 2015-02-18 LAB — CBC WITH DIFFERENTIAL/PLATELET
Basophils Absolute: 0.1 10*3/uL (ref 0.0–0.1)
Basophils Relative: 1 % (ref 0–1)
Eosinophils Absolute: 0.2 10*3/uL (ref 0.0–0.7)
Eosinophils Relative: 3 % (ref 0–5)
HCT: 38.7 % (ref 36.0–46.0)
Hemoglobin: 12.4 g/dL (ref 12.0–15.0)
Lymphocytes Relative: 48 % — ABNORMAL HIGH (ref 12–46)
Lymphs Abs: 3.6 10*3/uL (ref 0.7–4.0)
MCH: 26 pg (ref 26.0–34.0)
MCHC: 32 g/dL (ref 30.0–36.0)
MCV: 81.1 fL (ref 78.0–100.0)
MPV: 9.4 fL (ref 8.6–12.4)
Monocytes Absolute: 0.5 10*3/uL (ref 0.1–1.0)
Monocytes Relative: 7 % (ref 3–12)
Neutro Abs: 3.1 10*3/uL (ref 1.7–7.7)
Neutrophils Relative %: 41 % — ABNORMAL LOW (ref 43–77)
Platelets: 364 10*3/uL (ref 150–400)
RBC: 4.77 MIL/uL (ref 3.87–5.11)
RDW: 16.2 % — ABNORMAL HIGH (ref 11.5–15.5)
WBC: 7.5 10*3/uL (ref 4.0–10.5)

## 2015-02-18 LAB — HEMOGLOBIN A1C
Hgb A1c MFr Bld: 6.5 % — ABNORMAL HIGH (ref <5.7)
Mean Plasma Glucose: 140 mg/dL — ABNORMAL HIGH (ref <117)

## 2015-02-19 LAB — MICROALBUMIN / CREATININE URINE RATIO
Creatinine, Urine: 88.5 mg/dL
Microalb Creat Ratio: 3.4 mg/g (ref 0.0–30.0)
Microalb, Ur: 0.3 mg/dL (ref <2.0)

## 2015-08-20 ENCOUNTER — Encounter: Payer: Self-pay | Admitting: Family Medicine

## 2015-08-20 ENCOUNTER — Ambulatory Visit (INDEPENDENT_AMBULATORY_CARE_PROVIDER_SITE_OTHER): Payer: BLUE CROSS/BLUE SHIELD | Admitting: Family Medicine

## 2015-08-20 VITALS — BP 128/68 | HR 78 | Temp 98.8°F | Resp 14 | Ht 61.0 in | Wt 170.0 lb

## 2015-08-20 DIAGNOSIS — T300 Burn of unspecified body region, unspecified degree: Secondary | ICD-10-CM

## 2015-08-20 DIAGNOSIS — Z23 Encounter for immunization: Secondary | ICD-10-CM | POA: Diagnosis not present

## 2015-08-20 DIAGNOSIS — B353 Tinea pedis: Secondary | ICD-10-CM | POA: Diagnosis not present

## 2015-08-20 DIAGNOSIS — L301 Dyshidrosis [pompholyx]: Secondary | ICD-10-CM

## 2015-08-20 DIAGNOSIS — E119 Type 2 diabetes mellitus without complications: Secondary | ICD-10-CM

## 2015-08-20 DIAGNOSIS — I10 Essential (primary) hypertension: Secondary | ICD-10-CM | POA: Diagnosis not present

## 2015-08-20 MED ORDER — LISINOPRIL-HYDROCHLOROTHIAZIDE 10-12.5 MG PO TABS: 1.0000 | ORAL_TABLET | Freq: Every day | ORAL | Status: DC

## 2015-08-20 MED ORDER — AMLODIPINE BESYLATE 10 MG PO TABS: 10.0000 mg | ORAL_TABLET | Freq: Every day | ORAL | Status: DC

## 2015-08-20 MED ORDER — NYSTATIN 100000 UNIT/GM EX POWD: CUTANEOUS | Status: DC

## 2015-08-20 MED ORDER — CLOBETASOL PROPIONATE 0.05 % EX CREA: 1.0000 "application " | TOPICAL_CREAM | Freq: Two times a day (BID) | CUTANEOUS | Status: DC

## 2015-08-20 NOTE — Assessment & Plan Note (Signed)
Recheck A1c currently diet controlled

## 2015-08-20 NOTE — Assessment & Plan Note (Signed)
Well-controlled change her medication

## 2015-08-20 NOTE — Progress Notes (Signed)
Patient ID: Danielle Hicks, female   DOB: August 19, 1956, 59 y.o.   MRN: [id number]   Subjective:    Patient ID: Danielle Hicks, female    DOB: 07/31/1956, 59 y.o.   MRN: [id number]  Patient presents for 6 month F/U  patient here to follow-up medications. She continues to have a tinea pedis however works in a wet environment there for just use the Lotrisone as needed she's actually had this on her skin for over 5 years.   She noticed that she has a roughened -like rash comes on her fingers especially when they're in moisture a lot. She had a spot on her right thumb then she burned it isn't blistered last week it is now healing over. She is due for her tetanus booster.   she is due for repeat A1c for diabetes mellitus currently diet controlled. Her weight is up 4 pounds.  Meds reviewed   Review Of Systems:  GEN- denies fatigue, fever, weight loss,weakness, recent illness HEENT- denies eye drainage, change in vision, nasal discharge, CVS- denies chest pain, palpitations RESP- denies SOB, cough, wheeze ABD- denies N/V, change in stools, abd pain GU- denies dysuria, hematuria, dribbling, incontinence MSK- denies joint pain, muscle aches, injury Neuro- denies headache, dizziness, syncope, seizure activity       Objective:    BP 128/68 mmHg  Pulse 78  Temp(Src) 98.8 F (37.1 C) (Oral)  Resp 14  Ht 5\' 1"  (1.549 m)  Wt 170 lb (77.111 kg)  BMI 32.14 kg/m2 GEN- NAD, alert and oriented x3 HEENT- PERRL, EOMI, non injected sclera, pink conjunctiva, MMM, oropharynx clear CVS- RRR, no murmur RESP-CTAB ABD-NABS,soft,NT,ND Skin- erythema with maceration beneath toes, dry cracking, flaking skin bilat, ezematous rash inner 2nd and 3rd digits bialt, Right thumb- mild erythema with scab on side of thumb, no drainage, callus over DIP EXT- No edema Pulses- Radial,  2+      Assessment & Plan:      Problem List Items Addressed This Visit    Tinea pedis     Chronic problem for patient I do  not think oral medication would help as she is consistently and wet socks and shoes every day for her job.      Relevant Medications   nystatin (MYCOSTATIN/NYSTOP) 100000 UNIT/GM POWD   Essential hypertension, benign     Well-controlled change her medication      Relevant Medications   lisinopril-hydrochlorothiazide (PRINZIDE,ZESTORETIC) 10-12.5 MG tablet   amLODipine (NORVASC) 10 MG tablet   Dyshidrotic eczema     Clobetasol cream for hands. Discussed keeping hands dry and using gloves when cleaning      Diabetes mellitus type II, controlled ([place])     Recheck A1c currently diet controlled      Relevant Medications   lisinopril-hydrochlorothiazide (PRINZIDE,ZESTORETIC) 10-12.5 MG tablet   amLODipine (NORVASC) 10 MG tablet   Other Relevant Orders   CBC with Differential/Platelet   Comprehensive metabolic panel   Hemoglobin A1c    Other Visit Diagnoses    Burn    -  Primary    Healing well, use bactricin to area, TDAP given    Need for prophylactic vaccination and inoculation against influenza        Relevant Orders    Flu Vaccine QUAD 36+ mos PF IM (Fluarix & Fluzone Quad PF) (Completed)    Need for prophylactic vaccination with combined diphtheria-tetanus-pertussis (DTP) vaccine        Relevant Orders    Tdap vaccine greater than or  equal to 7yo IM (Completed)       Note: This dictation was prepared with Dragon dictation along with smaller phrase technology. Any transcriptional errors that result from this process are unintentional.

## 2015-08-20 NOTE — Assessment & Plan Note (Signed)
Clobetasol cream for hands. Discussed keeping hands dry and using gloves when cleaning

## 2015-08-20 NOTE — Patient Instructions (Addendum)
Use the Clobetasol on your hands Powder resent to pharmacy Tetanus Booster and Flu shot We will call with labs F/U 6 months- Physical

## 2015-08-20 NOTE — Assessment & Plan Note (Signed)
Chronic problem for patient I do not think oral medication would help as she is consistently and wet socks and shoes every day for her job.

## 2015-08-21 LAB — HEMOGLOBIN A1C
Hgb A1c MFr Bld: 6.4 % — ABNORMAL HIGH (ref <5.7)
Mean Plasma Glucose: 137 mg/dL — ABNORMAL HIGH (ref <117)

## 2015-08-21 LAB — CBC WITH DIFFERENTIAL/PLATELET
Basophils Absolute: 0 10*3/uL (ref 0.0–0.1)
Basophils Relative: 0 % (ref 0–1)
Eosinophils Absolute: 0.4 10*3/uL (ref 0.0–0.7)
Eosinophils Relative: 4 % (ref 0–5)
HCT: 36.1 % (ref 36.0–46.0)
Hemoglobin: 12.2 g/dL (ref 12.0–15.0)
Lymphocytes Relative: 39 % (ref 12–46)
Lymphs Abs: 3.7 10*3/uL (ref 0.7–4.0)
MCH: 26.9 pg (ref 26.0–34.0)
MCHC: 33.8 g/dL (ref 30.0–36.0)
MCV: 79.5 fL (ref 78.0–100.0)
MPV: 9.2 fL (ref 8.6–12.4)
Monocytes Absolute: 0.5 10*3/uL (ref 0.1–1.0)
Monocytes Relative: 5 % (ref 3–12)
Neutro Abs: 4.9 10*3/uL (ref 1.7–7.7)
Neutrophils Relative %: 52 % (ref 43–77)
Platelets: 346 10*3/uL (ref 150–400)
RBC: 4.54 MIL/uL (ref 3.87–5.11)
RDW: 16.7 % — ABNORMAL HIGH (ref 11.5–15.5)
WBC: 9.5 10*3/uL (ref 4.0–10.5)

## 2015-08-21 LAB — COMPREHENSIVE METABOLIC PANEL
ALT: 11 U/L (ref 6–29)
AST: 14 U/L (ref 10–35)
Albumin: 4 g/dL (ref 3.6–5.1)
Alkaline Phosphatase: 72 U/L (ref 33–130)
BUN: 14 mg/dL (ref 7–25)
CO2: 27 mmol/L (ref 20–31)
Calcium: 9.3 mg/dL (ref 8.6–10.4)
Chloride: 106 mmol/L (ref 98–110)
Creat: 0.72 mg/dL (ref 0.50–1.05)
Glucose, Bld: 82 mg/dL (ref 70–99)
Potassium: 3.7 mmol/L (ref 3.5–5.3)
Sodium: 139 mmol/L (ref 135–146)
Total Bilirubin: 0.4 mg/dL (ref 0.2–1.2)
Total Protein: 6.4 g/dL (ref 6.1–8.1)

## 2015-10-29 ENCOUNTER — Telehealth: Payer: Self-pay | Admitting: Family Medicine

## 2015-10-29 NOTE — Telephone Encounter (Signed)
Pt is calling for a refill of Acifex.  [facility] in [place]

## 2015-10-30 NOTE — Telephone Encounter (Signed)
Call placed to patient. [place].   FYI: patient does have appt on 10/31/2015.

## 2015-10-30 NOTE — Telephone Encounter (Signed)
Medication not noted on list.   Ok to refill?

## 2015-10-30 NOTE — Telephone Encounter (Signed)
Call pt and verify concern and medication (Aciphex)

## 2015-10-31 ENCOUNTER — Ambulatory Visit (INDEPENDENT_AMBULATORY_CARE_PROVIDER_SITE_OTHER): Payer: BLUE CROSS/BLUE SHIELD | Admitting: Family Medicine

## 2015-10-31 ENCOUNTER — Encounter: Payer: Self-pay | Admitting: Family Medicine

## 2015-10-31 VITALS — BP 130/76 | HR 78 | Temp 98.7°F | Resp 14 | Ht 61.0 in | Wt 171.0 lb

## 2015-10-31 DIAGNOSIS — M545 Low back pain, unspecified: Secondary | ICD-10-CM

## 2015-10-31 DIAGNOSIS — K219 Gastro-esophageal reflux disease without esophagitis: Secondary | ICD-10-CM

## 2015-10-31 MED ORDER — DEXLANSOPRAZOLE 60 MG PO CPDR: 60.0000 mg | DELAYED_RELEASE_CAPSULE | Freq: Every day | ORAL | Status: DC

## 2015-10-31 MED ORDER — NAPROXEN 500 MG PO TABS: 500.0000 mg | ORAL_TABLET | Freq: Two times a day (BID) | ORAL | Status: DC

## 2015-10-31 MED ORDER — CYCLOBENZAPRINE HCL 10 MG PO TABS
10.0000 mg | ORAL_TABLET | Freq: Every day | ORAL | Status: DC
Start: 2015-10-31 — End: 2016-01-24

## 2015-10-31 NOTE — Assessment & Plan Note (Signed)
Trial of dexilant once a day , avoid foods that trigger symptoms

## 2015-10-31 NOTE — Telephone Encounter (Signed)
Patient in office and noted to be on Nexium in the past.

## 2015-10-31 NOTE — Patient Instructions (Signed)
Take the dexilant  Once a day  Use heating pad and muscle relaxer  F/U As previous

## 2015-10-31 NOTE — Progress Notes (Signed)
Patient ID: Danielle Hicks, female   DOB: [birth year], 57 y.o.   MRN: [id number]   Subjective:    Patient ID: Danielle Hicks, female    DOB: May 25, 1956, 59 y.o.   MRN: [id number]  Patient presents for F/U; Acid Reflux; and Lumbar Pain  Reflux- worsening reflux the past couple of months, has been eating more spicy foods and things that trigger it. She feels like she needs to belch, has burning sensation up center of chest. , Denies N/V, has loose bowels after taking TUMS other OTC and NSAIDS. She admits to taking Aleve on empty stomach for back pain the past week and stomach has been worse.   Low back pain for x 1 week. 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 Aleve has helped.     Review Of Systems:  GEN- denies fatigue, fever, weight loss,weakness, recent illness HEENT- denies eye drainage, change in vision, nasal discharge, CVS- denies chest pain, palpitations RESP- denies SOB, cough, wheeze ABD- denies N/V, change in stools, abd pain GU- denies dysuria, hematuria, dribbling, incontinence MSK- + joint pain, muscle aches, injury Neuro- denies headache, dizziness, syncope, seizure activity       Objective:    BP 130/76 mmHg  Pulse 78  Temp(Src) 98.7 F (37.1 C) (Oral)  Resp 14  Ht 5\' 1"  (1.549 m)  Wt 171 lb (77.565 kg)  BMI 32.33 kg/m2 GEN- NAD, alert and oriented x3 HEENT- PERRL, EOMI, non injected sclera, pink conjunctiva, MMM, oropharynx clear CVS- RRR, no murmur RESP-CTAB ABD-NABS,soft, mild TTp epigastric, no rebound, no guarding, neg murphys, no CVA tenderness  MSK- lumbar spine NT, neg SLR, good ROM, +paraspinal spasm right side  Neuro Motor equal bilat, normal tone, sensation in tact Pulses- Radial - 2+        Assessment & Plan:      Problem List Items Addressed  This Visit    GERD (gastroesophageal reflux disease) - Primary    Trial of dexilant once a day , avoid foods that trigger symptoms      Relevant Medications   dexlansoprazole (DEXILANT) 60 MG capsule    Other Visit Diagnoses    Right-sided low back pain without sciatica        MSK strain, mild spasm, will give Flexeril at bedtime, limit NSAIDS with uncontrolled reflux and if she does take, has to take with food,    Relevant Medications    cyclobenzaprine (FLEXERIL) 10 MG tablet    naproxen (NAPROSYN) 500 MG tablet       Note: This dictation was prepared with Dragon dictation along with smaller phrase technology. Any transcriptional errors that result from this process are unintentional.

## 2015-11-01 ENCOUNTER — Telehealth: Payer: Self-pay | Admitting: *Deleted

## 2015-11-01 NOTE — Telephone Encounter (Signed)
Received request from pharmacy for PA on Dexilant.  PA submitted.   Dx: K21.9- GERD. 

## 2015-11-06 NOTE — Telephone Encounter (Signed)
Received PA determination.   PA cancelled.   Advised that medication currently does not require clinical review.   Case # [id number].  Faxed to pharmacy.

## 2015-11-13 ENCOUNTER — Telehealth: Payer: Self-pay | Admitting: Family Medicine

## 2015-11-13 MED ORDER — DEXLANSOPRAZOLE 60 MG PO CPDR: 60.0000 mg | DELAYED_RELEASE_CAPSULE | Freq: Every day | ORAL | Status: DC

## 2015-11-13 NOTE — Telephone Encounter (Signed)
Patient needs refill on her dexilant, she says [facility] told her to call us  [phone]

## 2015-11-13 NOTE — Telephone Encounter (Signed)
Prescription sent to pharmacy.

## 2015-11-15 ENCOUNTER — Telehealth: Payer: Self-pay | Admitting: *Deleted

## 2015-11-15 NOTE — Telephone Encounter (Signed)
Received request from pharmacy for PA on Dexilant.  PA submitted.   Dx: K21.9- GERD. 

## 2015-11-20 MED ORDER — PANTOPRAZOLE SODIUM 40 MG PO TBEC: 40.0000 mg | DELAYED_RELEASE_TABLET | Freq: Every day | ORAL | Status: DC

## 2015-11-20 NOTE — Telephone Encounter (Signed)
Received PA determination.   PA denied.   Patient must try and fail: Omeprazole Lansoprazole Pantoprazole Rabeprazole  MD please advise.

## 2015-11-20 NOTE — Telephone Encounter (Signed)
Medication changed to Protonix

## 2015-11-20 NOTE — Telephone Encounter (Signed)
Does she needs to fail all of these, Has tried Omeprazole and Nexium    If she has to fail all, then put on protonix 40mg  once a day

## 2015-11-28 LAB — HM MAMMOGRAPHY

## 2015-11-29 ENCOUNTER — Encounter: Payer: Self-pay | Admitting: *Deleted

## 2015-12-12 ENCOUNTER — Ambulatory Visit (INDEPENDENT_AMBULATORY_CARE_PROVIDER_SITE_OTHER): Payer: BLUE CROSS/BLUE SHIELD | Admitting: Family Medicine

## 2015-12-12 ENCOUNTER — Encounter: Payer: Self-pay | Admitting: Family Medicine

## 2015-12-12 VITALS — BP 128/74 | HR 74 | Temp 97.9°F | Resp 20 | Wt 174.5 lb

## 2015-12-12 DIAGNOSIS — K219 Gastro-esophageal reflux disease without esophagitis: Secondary | ICD-10-CM | POA: Diagnosis not present

## 2015-12-12 DIAGNOSIS — R109 Unspecified abdominal pain: Secondary | ICD-10-CM

## 2015-12-12 DIAGNOSIS — R197 Diarrhea, unspecified: Secondary | ICD-10-CM

## 2015-12-12 MED ORDER — ELUXADOLINE 75 MG PO TABS: 1.0000 | ORAL_TABLET | Freq: Two times a day (BID) | ORAL | Status: DC

## 2015-12-12 NOTE — Progress Notes (Signed)
Patient ID: Danielle Hicks, female   DOB: 1956/04/14, 60 y.o.   MRN: [id number]    Subjective:    Patient ID: Danielle Hicks, female    DOB: 08-27-1956, 60 y.o.   MRN: [id number]  Patient presents for stomach issues  patient here with abdominal issues. For the past few months she has had problems with acid reflux and problems with her bowels. I's treated her with the [place]  Is working well but her insurance that covers currently on proton aches and this helps her acid reflux symptoms for the most part. But not with her bowels. After she E she gets cramping in loud noises in her abdomen and then she has significant diarrhea. She does not have any diarrhea in between. She does no sit with heavy foods fried foods and now with some dairy products. She now is worried about eating out in public or not make it to the restroom after she eats. She has not had any weight loss. She feels like she is always gassy and bloated. She is not taking any Imodium. She did have a colonoscopy back in 2009.    Review Of Systems:  GEN- denies fatigue, fever, weight loss,weakness, recent illness HEENT- denies eye drainage, change in vision, nasal discharge, CVS- denies chest pain, palpitations RESP- denies SOB, cough, wheeze ABD- denies N/V, +change in stools, +abd pain GU- denies dysuria, hematuria, dribbling, incontinence MSK- denies joint pain, muscle aches, injury Neuro- denies headache, dizziness, syncope, seizure activity       Objective:    BP 128/74 mmHg  Pulse 74  Temp(Src) 97.9 F (36.6 C) (Oral)  Resp 20  Wt 174 lb 8 oz (79.153 kg) GEN- NAD, alert and oriented x3 HEENT- PERRL, EOMI, non injected sclera, pink conjunctiva, MMM, oropharynx clear CVS- RRR, no murmur RESP-CTAB ABD-NABS,soft,NT, bloated         Assessment & Plan:      Problem List Items Addressed This Visit    GERD (gastroesophageal reflux disease)   Relevant Medications   Eluxadoline (VIBERZI) 75 MG TABS     Other Visit Diagnoses    Diarrhea, unspecified type    -  Primary    Concern for IBS based on history, may have some lactose intolerance as well. With Gerd SYMPTOMS will send to GI for evaluiation, abd exam benign today. Given Viberzi samples to see if this helps with mealtime for now.      Abdominal cramping           Note: This dictation was prepared with Dragon dictation along with smaller phrase technology. Any transcriptional errors that result from this process are unintentional.

## 2015-12-12 NOTE — Patient Instructions (Signed)
Take the Viberzi twice a day  Referral GI for you stomach  No fried foods/ avoid dairy  F/U as previous

## 2015-12-12 NOTE — Assessment & Plan Note (Signed)
Continue protonix  

## 2015-12-13 ENCOUNTER — Encounter (INDEPENDENT_AMBULATORY_CARE_PROVIDER_SITE_OTHER): Payer: Self-pay | Admitting: *Deleted

## 2016-01-15 ENCOUNTER — Ambulatory Visit (INDEPENDENT_AMBULATORY_CARE_PROVIDER_SITE_OTHER): Payer: 59 | Admitting: Internal Medicine

## 2016-01-24 ENCOUNTER — Ambulatory Visit (INDEPENDENT_AMBULATORY_CARE_PROVIDER_SITE_OTHER): Payer: BLUE CROSS/BLUE SHIELD | Admitting: Internal Medicine

## 2016-01-24 ENCOUNTER — Encounter (INDEPENDENT_AMBULATORY_CARE_PROVIDER_SITE_OTHER): Payer: Self-pay | Admitting: Internal Medicine

## 2016-01-24 VITALS — BP 140/92 | HR 66 | Temp 98.9°F | Ht 61.0 in | Wt 170.8 lb

## 2016-01-24 DIAGNOSIS — K219 Gastro-esophageal reflux disease without esophagitis: Secondary | ICD-10-CM | POA: Diagnosis not present

## 2016-01-24 NOTE — Patient Instructions (Signed)
GERD diet.  Continue the Protonix

## 2016-01-24 NOTE — Progress Notes (Addendum)
   Subjective:    Patient ID: Danielle Hicks, female    DOB: 14-Apr-1956, 60 y.o.   MRN: [id number]  HPI Referred by Dr. Buelah Manis for diarrhea. Her diarrhea has resolved. She had had the diarrhea for a couple of weeks. She saw Dr. Buelah Manis and was Viberzi which helped.  She is now having one stool a day and they are normal. No blood in her stool. Stools are nice and formed most of the time. Appetite is good. No weight loss. She has acid reflux when she eats spicy foods and greasy foods. She knows to avoid these.  He acid reflux is controlled with diet and the Protonix at this time.  She is not having any GI problems at this time. She know not to eat and lie down.  Her last colonoscopy was about 6 yrs ago and was normal.    Review of Systems Past Medical History  Diagnosis Date  . Hypertension   . Diabetes mellitus without complication [facility])     Past Surgical History  Procedure Laterality Date  . Abdominal hysterectomy      No Known Allergies  Current Outpatient Prescriptions on File Prior to Visit  Medication Sig Dispense Refill  . amLODipine (NORVASC) 10 MG tablet Take 1 tablet (10 mg total) by mouth daily. 30 tablet 6  . clobetasol cream (TEMOVATE) AB-123456789 % Apply 1 application topically 2 (two) times daily. 30 g 3  . clotrimazole-betamethasone (LOTRISONE) cream Apply topically 2 (two) times daily. 45 g 1  . lisinopril-hydrochlorothiazide (PRINZIDE,ZESTORETIC) 10-12.5 MG tablet Take 1 tablet by mouth daily. 30 tablet 6  . Multiple Vitamins-Calcium (ONE-A-DAY WOMENS FORMULA PO) Take by mouth.    . naproxen (NAPROSYN) 500 MG tablet Take 1 tablet (500 mg total) by mouth 2 (two) times daily with a meal. 60 tablet 2  . pantoprazole (PROTONIX) 40 MG tablet Take 1 tablet (40 mg total) by mouth daily. 30 tablet 3   No current facility-administered medications on file prior to visit.        Objective:   Physical Exam Blood pressure 140/92, pulse 66, temperature 98.9 F (37.2 C),  height 5\' 1"  (1.549 m), weight 170 lb 12.8 oz (77.474 kg). Alert and oriented. Skin warm and dry. Oral mucosa is moist.   . Sclera anicteric, conjunctivae is pink. Thyroid not enlarged. No cervical lymphadenopathy. Lungs clear. Heart regular rate and rhythm.  Abdomen is soft. Bowel sounds are positive. No hepatomegaly. No abdominal masses felt. No tenderness.  No edema to lower extremities.          Assessment & Plan:  GERD controlled with diet and Protonix Diarrhea has resolved since changing her diet. Continue the Protnix. GERD diet given to patient.  OV in 6 weeks.

## 2016-02-19 ENCOUNTER — Encounter: Payer: Self-pay | Admitting: Family Medicine

## 2016-02-19 ENCOUNTER — Ambulatory Visit (INDEPENDENT_AMBULATORY_CARE_PROVIDER_SITE_OTHER): Payer: BLUE CROSS/BLUE SHIELD | Admitting: Family Medicine

## 2016-02-19 VITALS — BP 130/68 | HR 72 | Temp 98.9°F | Resp 14 | Ht 61.0 in | Wt 170.0 lb

## 2016-02-19 DIAGNOSIS — E119 Type 2 diabetes mellitus without complications: Secondary | ICD-10-CM | POA: Diagnosis not present

## 2016-02-19 DIAGNOSIS — M6248 Contracture of muscle, other site: Secondary | ICD-10-CM | POA: Diagnosis not present

## 2016-02-19 DIAGNOSIS — Z720 Tobacco use: Secondary | ICD-10-CM

## 2016-02-19 DIAGNOSIS — I10 Essential (primary) hypertension: Secondary | ICD-10-CM | POA: Diagnosis not present

## 2016-02-19 DIAGNOSIS — Z0000 Encounter for general adult medical examination without abnormal findings: Secondary | ICD-10-CM | POA: Diagnosis not present

## 2016-02-19 DIAGNOSIS — M62838 Other muscle spasm: Secondary | ICD-10-CM

## 2016-02-19 NOTE — Patient Instructions (Signed)
Continue current medications Try anti-histamine as needed  Get the labs done -FASTING  F/U 4 months

## 2016-02-19 NOTE — Assessment & Plan Note (Signed)
Blood pressure is well controlled and changed her medication 

## 2016-02-19 NOTE — Assessment & Plan Note (Signed)
Diet control. Recheck her labs.

## 2016-02-19 NOTE — Progress Notes (Signed)
Patient ID: Danielle Hicks, female   DOB: 12-11-1955, 60 y.o.   MRN: [id number]    Subjective:    Patient ID: Danielle Hicks, female    DOB: February 13, 1956, 61 y.o.   MRN: [id number]  Patient presents for CPE  Patient here for complete physical exam. Mammogram is up-to-date colonoscopy up-to-date. She does not require Pap smears she's had hysterectomy. Immunizations are up-to-date.  Last visit she was having significant diarrhea this is resolved.  Diabetes mellitus is currently diet controlled she is due for repeat labs. Her weight is stable she states she is trying to exercise more.  She's had a crick in her neck for the past couple weeks she has not tried anything to help resolve it. She developed when she goes to lay down on her pillow she gets tension that shoots up towards her occipital region. No specific injury   Review Of Systems:  GEN- denies fatigue, fever, weight loss,weakness, recent illness HEENT- denies eye drainage, change in vision, nasal discharge, CVS- denies chest pain, palpitations RESP- denies SOB, cough, wheeze ABD- denies N/V, change in stools, abd pain GU- denies dysuria, hematuria, dribbling, incontinence MSK-+ joint pain, muscle aches, injury Neuro- denies headache, dizziness, syncope, seizure activity       Objective:    BP 130/68 mmHg  Pulse 72  Temp(Src) 98.9 F (37.2 C) (Oral)  Resp 14  Ht 5\' 1"  (1.549 m)  Wt 170 lb (77.111 kg)  BMI 32.14 kg/m2 GEN- NAD, alert and oriented x3 HEENT- PERRL, EOMI, non injected sclera, pink conjunctiva, MMM, oropharynx clear Neck- Supple, no thyromegaly, mild TTP Left Trapezius, mild spasm, good ROM neck  CVS- RRR, no murmur RESP-CTAB ABD-NABS,soft,NT,ND MSK- FROM upper ext  EXT- No edema Pulses- Radial, DP- 2+        Assessment & Plan:      Problem List Items Addressed This Visit    Tobacco use    Counseled on tobacco cessation      Essential hypertension, benign    Blood pressure is  well-controlled and changed her medication      Relevant Orders   Lipid panel   Diabetes mellitus type II, controlled ([place]) - Primary    Diet control. Recheck her labs.      Relevant Orders   Hemoglobin A1c   Lipid panel   HM DIABETES FOOT EXAM (Completed)    Other Visit Diagnoses    Routine general medical examination at a health care facility        CPE done, she will get fasting labs tomorrow, prevnetion UTD     Relevant Orders    CBC with Differential/Platelet    Comprehensive metabolic panel    Neck muscle spasm        mild spasm, advised topical anti-inflammatory massage, apply heat , no red flags        Note: This dictation was prepared with Dragon dictation along with smaller phrase technology. Any transcriptional errors that result from this process are unintentional.

## 2016-02-19 NOTE — Assessment & Plan Note (Signed)
Counseled on tobacco cessation 

## 2016-02-20 LAB — CBC WITH DIFFERENTIAL/PLATELET
Basophils Absolute: 66 cells/uL (ref 0–200)
Basophils Relative: 1 %
Eosinophils Absolute: 198 cells/uL (ref 15–500)
Eosinophils Relative: 3 %
HCT: 39.2 % (ref 35.0–45.0)
Hemoglobin: 12.5 g/dL (ref 12.0–15.0)
Lymphocytes Relative: 46 %
Lymphs Abs: 3036 cells/uL (ref 850–3900)
MCH: 25.6 pg — ABNORMAL LOW (ref 27.0–33.0)
MCHC: 31.9 g/dL — ABNORMAL LOW (ref 32.0–36.0)
MCV: 80.2 fL (ref 80.0–100.0)
MPV: 9.8 fL (ref 7.5–12.5)
Monocytes Absolute: 396 cells/uL (ref 200–950)
Monocytes Relative: 6 %
Neutro Abs: 2904 cells/uL (ref 1500–7800)
Neutrophils Relative %: 44 %
Platelets: 343 10*3/uL (ref 140–400)
RBC: 4.89 MIL/uL (ref 3.80–5.10)
RDW: 17.6 % — ABNORMAL HIGH (ref 11.0–15.0)
WBC: 6.6 10*3/uL (ref 3.8–10.8)

## 2016-02-20 LAB — COMPREHENSIVE METABOLIC PANEL
ALT: 18 U/L (ref 6–29)
AST: 16 U/L (ref 10–35)
Albumin: 3.8 g/dL (ref 3.6–5.1)
Alkaline Phosphatase: 71 U/L (ref 33–130)
BUN: 14 mg/dL (ref 7–25)
CO2: 27 mmol/L (ref 20–31)
Calcium: 9 mg/dL (ref 8.6–10.4)
Chloride: 109 mmol/L (ref 98–110)
Creat: 0.7 mg/dL (ref 0.50–1.05)
Glucose, Bld: 100 mg/dL — ABNORMAL HIGH (ref 70–99)
Potassium: 4.5 mmol/L (ref 3.5–5.3)
Sodium: 142 mmol/L (ref 135–146)
Total Bilirubin: 0.5 mg/dL (ref 0.2–1.2)
Total Protein: 6.3 g/dL (ref 6.1–8.1)

## 2016-02-20 LAB — LIPID PANEL
Cholesterol: 176 mg/dL (ref 125–200)
HDL: 47 mg/dL (ref >=46)
LDL Cholesterol: 116 mg/dL (ref <130)
Total CHOL/HDL Ratio: 3.7 Ratio (ref <=5.0)
Triglycerides: 66 mg/dL (ref <150)
VLDL: 13 mg/dL (ref <30)

## 2016-02-21 LAB — HEMOGLOBIN A1C
Hgb A1c MFr Bld: 6.4 % — ABNORMAL HIGH (ref <5.7)
Mean Plasma Glucose: 137 mg/dL

## 2016-03-06 ENCOUNTER — Ambulatory Visit (INDEPENDENT_AMBULATORY_CARE_PROVIDER_SITE_OTHER): Payer: BLUE CROSS/BLUE SHIELD | Admitting: Internal Medicine

## 2016-03-11 ENCOUNTER — Encounter (INDEPENDENT_AMBULATORY_CARE_PROVIDER_SITE_OTHER): Payer: Self-pay | Admitting: Internal Medicine

## 2016-03-11 ENCOUNTER — Ambulatory Visit (INDEPENDENT_AMBULATORY_CARE_PROVIDER_SITE_OTHER): Payer: BLUE CROSS/BLUE SHIELD | Admitting: Internal Medicine

## 2016-03-11 VITALS — BP 150/88 | HR 56 | Temp 98.6°F | Ht 61.0 in | Wt 173.3 lb

## 2016-03-11 DIAGNOSIS — K219 Gastro-esophageal reflux disease without esophagitis: Secondary | ICD-10-CM | POA: Diagnosis not present

## 2016-03-11 DIAGNOSIS — R197 Diarrhea, unspecified: Secondary | ICD-10-CM | POA: Diagnosis not present

## 2016-03-11 NOTE — Patient Instructions (Signed)
Continue the Protonix. OV in 1 year.  

## 2016-03-11 NOTE — Progress Notes (Signed)
   Subjective:    Patient ID: Danielle Hicks, female    DOB: 1956/05/18, 60 y.o.   MRN: [id number]  HPI here today for f/u of her diarrhea. She was last seen in March as a new patient. Her diarrhea had resolved before her OV. She was started on Viberzi which helped her diarrhea.  She tells me she is walking about 2-3 times a week and 2-3 miles. Her appetite is good. She is watching what she eats. Her acid reflux is controlled with Protonix.  She has gained 3 pounds since her visit in March.  She has a BM daily. No melena or BRRB.  Her last colonoscopy was about 6 yrs ago and was normal per patient.       Review of Systems Past Medical History  Diagnosis Date  . Hypertension   . Diabetes mellitus without complication ([place])   . GERD (gastroesophageal reflux disease)     Past Surgical History  Procedure Laterality Date  . Abdominal hysterectomy      No Known Allergies  Current Outpatient Prescriptions on File Prior to Visit  Medication Sig Dispense Refill  . amLODipine (NORVASC) 10 MG tablet Take 1 tablet (10 mg total) by mouth daily. 30 tablet 6  . clobetasol cream (TEMOVATE) [id number] % Apply 1 application topically 2 (two) times daily. 30 g 3  . clotrimazole-betamethasone (LOTRISONE) cream Apply topically 2 (two) times daily. 45 g 1  . lisinopril-hydrochlorothiazide (PRINZIDE,ZESTORETIC) 10-12.5 MG tablet Take 1 tablet by mouth daily. 30 tablet 6  . Multiple Vitamins-Calcium (ONE-A-DAY WOMENS FORMULA PO) Take by mouth.    . pantoprazole (PROTONIX) 40 MG tablet Take 1 tablet (40 mg total) by mouth daily. 30 tablet 3   No current facility-administered medications on file prior to visit.        Objective:   Physical Exam Blood pressure 150/88, pulse 56, temperature 98.6 F (37 C), height 5\' 1"  (1.549 m), weight 173 lb 4.8 oz (78.608 kg). Alert and oriented. Skin warm and dry. Oral mucosa is moist.   . Sclera anicteric, conjunctivae is pink. Thyroid not enlarged. No cervical  lymphadenopathy. Lungs clear. Heart regular rate and rhythm.  Abdomen is soft. Bowel sounds are positive. No hepatomegaly. No abdominal masses felt. No tenderness.  No edema to lower extremities.          Assessment & Plan:  Diarrhea which has now resolved.  No GI problems.  OV in 1 year.

## 2016-04-01 ENCOUNTER — Ambulatory Visit (INDEPENDENT_AMBULATORY_CARE_PROVIDER_SITE_OTHER): Payer: BLUE CROSS/BLUE SHIELD | Admitting: Orthopaedic Surgery

## 2016-04-01 ENCOUNTER — Encounter: Payer: Self-pay | Admitting: Orthopaedic Surgery

## 2016-04-01 VITALS — BP 132/82 | HR 65 | Temp 97.7°F | Ht 61.0 in | Wt 164.0 lb

## 2016-04-01 DIAGNOSIS — M25561 Pain in right knee: Secondary | ICD-10-CM | POA: Diagnosis not present

## 2016-04-01 DIAGNOSIS — I10 Essential (primary) hypertension: Secondary | ICD-10-CM

## 2016-04-01 NOTE — Progress Notes (Signed)
CC:  I have pain of my right knee. I would like an injection.  The patient has chronic pain of the right knee.  There is no recent trauma.  There is no redness.  Injections in the past have helped.  The knee has no redness, has an effusion and crepitus present.  ROM of the knee is 0-110.  Impression:  Chronic knee pain right.  Return: as needed.  PROCEDURE NOTE:  The patient requests injections of the right knee , verbal consent was obtained.  The right knee was prepped appropriately after time out was performed.   Sterile technique was observed and injection of 1 cc of Depo-Medrol 40 mg with several cc's of plain xylocaine. Anesthesia was provided by ethyl chloride and a 20-gauge needle was used to inject the knee area. The injection was tolerated well.  A band aid dressing was applied.  The patient was advised to apply ice later today and tomorrow to the injection sight as needed.

## 2016-05-09 ENCOUNTER — Other Ambulatory Visit: Payer: Self-pay | Admitting: *Deleted

## 2016-05-09 MED ORDER — PANTOPRAZOLE SODIUM 40 MG PO TBEC: 40.0000 mg | DELAYED_RELEASE_TABLET | Freq: Every day | ORAL | Status: DC

## 2016-05-09 NOTE — Telephone Encounter (Signed)
Received fax requesting refill on Pantoprazole.   Refill appropriate and filled per protocol. 

## 2016-08-06 ENCOUNTER — Other Ambulatory Visit: Payer: Self-pay | Admitting: Family Medicine

## 2016-08-06 DIAGNOSIS — E119 Type 2 diabetes mellitus without complications: Secondary | ICD-10-CM

## 2016-09-19 ENCOUNTER — Ambulatory Visit (INDEPENDENT_AMBULATORY_CARE_PROVIDER_SITE_OTHER): Payer: BLUE CROSS/BLUE SHIELD | Admitting: Family Medicine

## 2016-09-19 ENCOUNTER — Encounter: Payer: Self-pay | Admitting: Family Medicine

## 2016-09-19 VITALS — BP 130/74 | HR 72 | Temp 99.2°F | Resp 16 | Ht 61.0 in | Wt 162.0 lb

## 2016-09-19 DIAGNOSIS — J209 Acute bronchitis, unspecified: Secondary | ICD-10-CM

## 2016-09-19 DIAGNOSIS — E119 Type 2 diabetes mellitus without complications: Secondary | ICD-10-CM

## 2016-09-19 MED ORDER — ALBUTEROL SULFATE HFA 108 (90 BASE) MCG/ACT IN AERS: 2.0000 | INHALATION_SPRAY | RESPIRATORY_TRACT | 0 refills | Status: DC | PRN

## 2016-09-19 MED ORDER — PANTOPRAZOLE SODIUM 40 MG PO TBEC: 40.0000 mg | DELAYED_RELEASE_TABLET | Freq: Every day | ORAL | 3 refills | Status: DC

## 2016-09-19 MED ORDER — AZITHROMYCIN 250 MG PO TABS
ORAL_TABLET | ORAL | 0 refills | Status: DC
Start: 2016-09-19 — End: 2016-12-23

## 2016-09-19 MED ORDER — AMLODIPINE BESYLATE 10 MG PO TABS: 10.0000 mg | ORAL_TABLET | Freq: Every day | ORAL | 6 refills | Status: DC

## 2016-09-19 MED ORDER — LISINOPRIL-HYDROCHLOROTHIAZIDE 10-12.5 MG PO TABS: 1.0000 | ORAL_TABLET | Freq: Every day | ORAL | 6 refills | Status: DC

## 2016-09-19 MED ORDER — PREDNISONE 20 MG PO TABS: 20.0000 mg | ORAL_TABLET | Freq: Every day | ORAL | 0 refills | Status: DC

## 2016-09-19 NOTE — Patient Instructions (Addendum)
Use the inhaler as prescribed Antibiotics and steroids  F/U April for Physical

## 2016-09-19 NOTE — Progress Notes (Signed)
   Subjective:    Patient ID: Danielle Hicks, female    DOB: Oct 09, 1956, 60 y.o.   MRN: [id number]  Patient presents for Illness (x2 weeks- productive cough with yellow/green mucus, fatigue, ear pressure, HA, body aches- denies fever)  Cough with production wheezing, worsening over past 2 weeks, headache, swollen lypmph nodes, ear pain. No fever, + fatigue.  [place] HBP for cough which has helped some  +sick contacts  Also working 7 days a week     Review Of Systems:  GEN- denies fatigue, fever, weight loss,weakness, recent illness HEENT- denies eye drainage, change in vision, nasal discharge, CVS- denies chest pain, palpitations RESP- denies SOB, cough, wheeze ABD- denies N/V, change in stools, abd pain GU- denies dysuria, hematuria, dribbling, incontinence MSK- denies joint pain, muscle aches, injury Neuro- denies headache, dizziness, syncope, seizure activity       Objective:    BP 130/74 (BP Location: Left Arm, Patient Position: Sitting, Cuff Size: Normal)   Pulse 72   Temp 99.2 F (37.3 C) (Oral)   Resp 16   Ht 5\' 1"  (1.549 m)   Wt 162 lb (73.5 kg)   SpO2 98%   BMI 30.61 kg/m  GEN- NAD, alert and oriented x3 HEENT- PERRL, EOMI, non injected sclera, pink conjunctiva, MMM, oropharynx mild injection, TM clear bilat no effusion, no  maxillary sinus tenderness, inflammed turbinates,  Nasal drainage  Neck- Supple, no LAD CVS- RRR, no murmur RESP-scattered wheeze, mild congestion, normal WOB  EXT- No edema Pulses- Radial 2+          Assessment & Plan:      Problem List Items Addressed This Visit    Diabetes mellitus type II, controlled ([place])   Relevant Medications   lisinopril-hydrochlorothiazide (PRINZIDE,ZESTORETIC) 10-12.5 MG tablet   amLODipine (NORVASC) 10 MG tablet    Other Visit Diagnoses    Acute bronchitis, unspecified organism    -  Primary   progressed to bronchitis, treat with prednisone, zpak, albuterol      Note: This dictation was  prepared with Dragon dictation along with smaller phrase technology. Any transcriptional errors that result from this process are unintentional.

## 2016-11-28 LAB — HM MAMMOGRAPHY

## 2016-12-11 ENCOUNTER — Encounter: Payer: Self-pay | Admitting: Family Medicine

## 2016-12-23 ENCOUNTER — Encounter: Payer: Self-pay | Admitting: Family Medicine

## 2016-12-23 ENCOUNTER — Ambulatory Visit (INDEPENDENT_AMBULATORY_CARE_PROVIDER_SITE_OTHER): Payer: BLUE CROSS/BLUE SHIELD | Admitting: Family Medicine

## 2016-12-23 VITALS — BP 128/78 | HR 70 | Temp 98.5°F | Resp 14 | Ht 61.0 in | Wt 177.0 lb

## 2016-12-23 DIAGNOSIS — H9311 Tinnitus, right ear: Secondary | ICD-10-CM

## 2016-12-23 DIAGNOSIS — J0100 Acute maxillary sinusitis, unspecified: Secondary | ICD-10-CM | POA: Diagnosis not present

## 2016-12-23 DIAGNOSIS — K0889 Other specified disorders of teeth and supporting structures: Secondary | ICD-10-CM | POA: Diagnosis not present

## 2016-12-23 DIAGNOSIS — M542 Cervicalgia: Secondary | ICD-10-CM

## 2016-12-23 DIAGNOSIS — R0789 Other chest pain: Secondary | ICD-10-CM | POA: Diagnosis not present

## 2016-12-23 MED ORDER — AMOXICILLIN 875 MG PO TABS: 875.0000 mg | ORAL_TABLET | Freq: Two times a day (BID) | ORAL | 0 refills | Status: DC

## 2016-12-23 NOTE — Patient Instructions (Signed)
F/U 3 months for PHYSICAL 

## 2016-12-23 NOTE — Progress Notes (Signed)
Subjective:    Patient ID: Danielle Hicks, female    DOB: July 29, 1956, 61 y.o.   MRN: [id number]  Patient presents for Illness (ringing in B ears, pressure to ears, neck and lymph node soreness, chills) and Chest Wall Pain (reports muscle strain like feeling in chest)  Right ear pain, with buzzing for past few weeks, has a buzzing sound at work When they propped the door open, now it seems like she has a constant buzzing in her ears which is worse when everything is quiet Also has bad tooth on right side, that needs to be fixed Has had neck pain pain with moving different direction, also has chest wall and upper back pain on right side. Has had intermittant chills? She denies any shortness of breath diaphoresis nausea vomiting with the chest discomfort and no change with eating.  Has had some cough and congestion,  Nasal drainage sinus pressure as well the mostly on the left side. She also notices of "wetness in ears".   She is smoking but slowing down on this, down to 5 cig a day   Review Of Systems:  GEN- denies fatigue, fever, weight loss,weakness, recent illness HEENT- denies eye drainage, change in vision, +nasal discharge, CVS- denies chest pain, palpitations RESP- denies SOB, cough, wheeze ABD- denies N/V, change in stools, abd pain GU- denies dysuria, hematuria, dribbling, incontinence MSK- denies joint pain, muscle aches, injury Neuro- denies headache, dizziness, syncope, seizure activity       Objective:    BP 128/78   Pulse 70   Temp 98.5 F (36.9 C) (Oral)   Resp 14   Ht 5\' 1"  (1.549 m)   Wt 177 lb (80.3 kg)   SpO2 98%   BMI 33.44 kg/m  GEN- NAD, alert and oriented x3 HEENT- PERRL, EOMI, non injected sclera, pink conjunctiva, MMM, oropharynx clear, swelling lower right gumline, mild bogginess, TTP around tooth, TM clear bilat no effusion, canals clear , nares enlarged turbinates, discharge left side, mild maxilary sinus tenderness  Neck- Supple, +Right  cervical LAD, FROM neck, neg spurlngs  CVS- RRR, no murmur RESP-CTAB EXT- No edema Pulses- Radial  2+  EKG- unable to get complete read, portion seen is normal  Hearing exam- passed      Assessment & Plan:      Problem List Items Addressed This Visit    None    Visit Diagnoses    Atypical chest pain    -  Primary   check labs, does not sound cardiac, but has risk factor of HTN, advised if recurrent pain with SOB, radiation go to ER. Does not sound GI, likley more MKS pain,   Relevant Orders   EKG 12-Lead (Completed)   CBC with Differential/Platelet   Comprehensive metabolic panel   Tooth pain       Treat with amoxicillin, this is causing the LAD noted on exam, also has some mild sinus drainage    Buzzing in ear, right       hearing exam,  will have her use ear muffs, she states work will do exam if I give script, given today   Acute non-recurrent maxillary sinusitis       mild sinusitis, per above for tooth   Relevant Medications   amoxicillin (AMOXIL) 875 MG tablet   Neck pain       likley MSK pain, good ROM on exam and NT except where LAD present       Note: This dictation was prepared  with Dragon dictation along with smaller phrase technology. Any transcriptional errors that result from this process are unintentional.

## 2016-12-24 LAB — CBC WITH DIFFERENTIAL/PLATELET
Basophils Absolute: 0 cells/uL (ref 0–200)
Basophils Relative: 0 %
Eosinophils Absolute: 332 cells/uL (ref 15–500)
Eosinophils Relative: 4 %
HCT: 37.8 % (ref 35.0–45.0)
Hemoglobin: 12.1 g/dL (ref 12.0–15.0)
Lymphocytes Relative: 48 %
Lymphs Abs: 3984 cells/uL — ABNORMAL HIGH (ref 850–3900)
MCH: 26.5 pg — ABNORMAL LOW (ref 27.0–33.0)
MCHC: 32 g/dL (ref 32.0–36.0)
MCV: 82.7 fL (ref 80.0–100.0)
MPV: 9.4 fL (ref 7.5–12.5)
Monocytes Absolute: 415 cells/uL (ref 200–950)
Monocytes Relative: 5 %
Neutro Abs: 3569 cells/uL (ref 1500–7800)
Neutrophils Relative %: 43 %
Platelets: 301 10*3/uL (ref 140–400)
RBC: 4.57 MIL/uL (ref 3.80–5.10)
RDW: 16.9 % — ABNORMAL HIGH (ref 11.0–15.0)
WBC: 8.3 10*3/uL (ref 3.8–10.8)

## 2016-12-24 LAB — COMPREHENSIVE METABOLIC PANEL
ALT: 14 U/L (ref 6–29)
AST: 15 U/L (ref 10–35)
Albumin: 4 g/dL (ref 3.6–5.1)
Alkaline Phosphatase: 78 U/L (ref 33–130)
BUN: 14 mg/dL (ref 7–25)
CO2: 28 mmol/L (ref 20–31)
Calcium: 8.7 mg/dL (ref 8.6–10.4)
Chloride: 108 mmol/L (ref 98–110)
Creat: 0.8 mg/dL (ref 0.50–1.05)
Glucose, Bld: 92 mg/dL (ref 70–99)
Potassium: 4 mmol/L (ref 3.5–5.3)
Sodium: 142 mmol/L (ref 135–146)
Total Bilirubin: 0.3 mg/dL (ref 0.2–1.2)
Total Protein: 6.4 g/dL (ref 6.1–8.1)

## 2016-12-26 ENCOUNTER — Encounter: Payer: Self-pay | Admitting: *Deleted

## 2016-12-31 ENCOUNTER — Encounter: Payer: Self-pay | Admitting: Orthopaedic Surgery

## 2016-12-31 ENCOUNTER — Ambulatory Visit (INDEPENDENT_AMBULATORY_CARE_PROVIDER_SITE_OTHER): Payer: BLUE CROSS/BLUE SHIELD | Admitting: Orthopaedic Surgery

## 2016-12-31 ENCOUNTER — Ambulatory Visit (INDEPENDENT_AMBULATORY_CARE_PROVIDER_SITE_OTHER): Payer: BLUE CROSS/BLUE SHIELD

## 2016-12-31 VITALS — BP 151/97 | HR 64 | Temp 97.7°F | Ht 61.0 in | Wt 173.0 lb

## 2016-12-31 DIAGNOSIS — I10 Essential (primary) hypertension: Secondary | ICD-10-CM

## 2016-12-31 DIAGNOSIS — M25561 Pain in right knee: Secondary | ICD-10-CM

## 2016-12-31 DIAGNOSIS — G8929 Other chronic pain: Secondary | ICD-10-CM

## 2016-12-31 NOTE — Progress Notes (Signed)
CC:  I have pain of my right knee. I would like an injection.  The patient has chronic pain of the right knee.  There is no recent trauma.  There is no redness.  Injections in the past have helped.  The knee has no redness, has an effusion and crepitus present.  ROM of the right knee is 0-105.  Impression:  Chronic knee pain right  Return: after MRI of the right knee  PROCEDURE NOTE:  The patient requests injections of the right knee , verbal consent was obtained.  The right knee was prepped appropriately after time out was performed.   Sterile technique was observed and injection of 1 cc of Depo-Medrol 40 mg with several cc's of plain xylocaine. Anesthesia was provided by ethyl chloride and a 20-gauge needle was used to inject the knee area. The injection was tolerated well.  A band aid dressing was applied.  The patient was advised to apply ice later today and tomorrow to the injection sight as needed.  She has giving way of the right knee, pain, recurring effusions, and has had multiple injections.  I would like to get a MRI of the knee.  She may need arthroscopy vs total knee.  I will get a MRI.  She will need to see Dr. Aline Brochure.  Call if any problem  Precautions discussed.  X-rays were done and reported separately.  Electronically Signed Sanjuana Kava, MD 2/21/20183:42 PM

## 2017-01-02 ENCOUNTER — Ambulatory Visit (HOSPITAL_COMMUNITY): Payer: BLUE CROSS/BLUE SHIELD

## 2017-01-05 ENCOUNTER — Encounter: Payer: Self-pay | Admitting: Orthopaedic Surgery

## 2017-01-05 ENCOUNTER — Ambulatory Visit: Payer: BLUE CROSS/BLUE SHIELD | Admitting: Orthopedic Surgery

## 2017-01-06 ENCOUNTER — Encounter: Payer: Self-pay | Admitting: Orthopaedic Surgery

## 2017-01-06 ENCOUNTER — Ambulatory Visit: Payer: BLUE CROSS/BLUE SHIELD | Admitting: Orthopaedic Surgery

## 2017-01-07 ENCOUNTER — Ambulatory Visit (HOSPITAL_COMMUNITY)
Admission: RE | Admit: 2017-01-07 | Discharge: 2017-01-07 | Disposition: A | Discharge status: Home / Self Care | Payer: BLUE CROSS/BLUE SHIELD | Source: Ambulatory Visit | Attending: Orthopaedic Surgery | Admitting: Orthopaedic Surgery

## 2017-01-07 DIAGNOSIS — M1711 Unilateral primary osteoarthritis, right knee: Secondary | ICD-10-CM | POA: Diagnosis not present

## 2017-01-07 DIAGNOSIS — M7121 Synovial cyst of popliteal space [Baker], right knee: Secondary | ICD-10-CM | POA: Insufficient documentation

## 2017-01-07 DIAGNOSIS — M25561 Pain in right knee: Secondary | ICD-10-CM | POA: Diagnosis present

## 2017-01-07 DIAGNOSIS — G8929 Other chronic pain: Secondary | ICD-10-CM | POA: Diagnosis present

## 2017-01-26 ENCOUNTER — Encounter: Payer: Self-pay | Admitting: Orthopedic Surgery

## 2017-01-26 ENCOUNTER — Ambulatory Visit (INDEPENDENT_AMBULATORY_CARE_PROVIDER_SITE_OTHER): Payer: BLUE CROSS/BLUE SHIELD | Admitting: Orthopedic Surgery

## 2017-01-26 VITALS — BP 117/78 | HR 63 | Ht 61.5 in | Wt 176.0 lb

## 2017-01-26 DIAGNOSIS — M1711 Unilateral primary osteoarthritis, right knee: Secondary | ICD-10-CM | POA: Diagnosis not present

## 2017-01-26 NOTE — Patient Instructions (Addendum)
Preparing for Knee Replacement Recovery from knee replacement surgery can be made easier and more comfortable by being prepared before surgery. This includes:  Arranging for others to help you.  Preparing your home.  Preparing your body by getting a preoperative exam and being as healthy as you can.  Doing exercises before your surgery as directed by your health care provider.  You can ease any concerns about your financial responsibilities by calling your insurance company after you decide to have surgery. In addition to asking about your surgery and hospital stay, you will want to ask about coverage for medical equipment, rehabilitation facilities, and home care. How should I arrange for help? You will be stronger and more mobile every day. However, in the first couple weeks after surgery, it is unlikely you will be able to do all your daily activities as easily as before your surgery. You may tire easily and will still have limited movement in your leg. Follow these guidelines to best arrange for the help you may need after your surgery:  Plan to have someone take you home after the procedure. Your health care provider will be able to tell you how many days you can expect to be in the hospital.  Cancel all work, caregiving, and volunteer responsibilities for at least 4-6 weeks after your surgery.  If you live alone, arrange for someone to care for your home and pets for the first 4-6 weeks after surgery.  Select someone with whom you feel comfortable to be with you day and night for the first week. This person will help you with your exercises and personal care, such as bathing and using the toilet.  Arrange for drivers to bring you to and from your follow-up appointments, the grocery store, and other places you may need to go for at least 4-6 weeks.  How should I prepare my home?  Pick a recovery spot, but do not plan on recovering in bed. Sitting upright is better for your health. You  may want to use a recliner with a small table nearby. Place the items you use most frequently on that table. These may include the TV remote, a cordless phone, a book or laptop computer, a water glass, and any other items of your choice.  Remove all clutter from your floors. Also remove any throw rugs.  To see if you will be able to move in your home with a wheeled walker, hold your hands out about 6 inches (15 cm) from your sides. Walk from your recovery spot to your kitchen and bathroom. Then walk from your bed to the bathroom. If you do not hit anything with your hands, you have enough room.  Move the items you use most often in your kitchen, bathroom, and bedroom to shelves and drawers that are at countertop height.  Prepare a few meals to freeze and reheat later.  Consider adding grab bars in the shower and near the toilet.  While you are in the hospital, you will learn about equipment that can be helpful for your recovery. Some of the equipment includes raised toilet seats, tub benches, and shower benches. How should I prepare my body?  Have a preoperative exam. This will ensure that your body is healthy enough to safely have this surgery. Bring a complete list of all your medicines and supplements, including herbs and vitamins. You may need to have additional tests to ensure your safety.  Have elective dental care and routine cleanings completed before your surgery.   Germs from anywhere in your body, including your mouth, can travel to your new joint and infect it. It is important not to have any dental work performed for at least 3 months after your surgery. After surgery, be sure to tell your dentist about your joint replacement.  Maintain a healthy diet. Do not change your diet before surgery unless advised to do so by your health care provider.  Do not use any tobacco products, including cigarettes, chewing tobacco, or electronic cigarettes. If you need help quitting, ask your health  care provider. Tobacco and nicotine products can delay healing after your surgery.  The day before your surgery, follow your health care provider's directions for showering, eating, drinking, and taking medicines. These directions are for your safety. What kinds of exercises should I do? Your health care provider may have you do the following exercises before your surgery. Be sure to follow the exercise program only as directed by your health care provider. While completing these exercises, remember to stretch for as long as you can, up to 30 seconds. You should only feel a gentle lengthening or release in the stretched tissue. You should not feel pain. Ankle Pumps 1. While sitting on a firm surface with your legs straight out in front of you, move your feet at the ankle joints so that your toes are pulling back toward your chest. 2. Reverse the motion, pointing your toes away from you. 3. Repeat 10-20 times. Complete this exercise 1-2 times per day.  Heel Slides 1. Lie on your back with both knees straight. (If this causes back pain, bend one knee, placing your foot flat on the floor. Keep this leg in this position while doing heel slides with the opposite leg.) 2. Slowly slide one heel back toward your buttocks until you feel a gentle stretch in the front of your knee or thigh. 3. Slowly slide your heel back to the starting position. 4. Repeat 10-20 times, then switch heels and do it again. Complete this exercise 1-2 times per day.  Quadriceps Sets 1. Lie on your back with one leg extended and your opposite knee bent. 2. Gradually tighten the muscles in the front of the thigh of your extended leg. This motion will push the back of the knee down toward the floor. 3. Hold the muscle as tightly as you can without causing pain for 10 seconds. 4. Relax the muscles slowly and completely in between each repetition. 5. Repeat 10-20 times, then switch legs and do it again. Complete this exercise 1-2  times per day.  Short Arc Kicks 1. Lie on your back. Place a rolled towel (4-6 inches [10-15 cm] high) under one knee so that the knee slightly bends. 2. Raise only the lower leg of your slightly bent leg by tightening the muscles in the front of your thigh. Do not allow your thigh to rise. 3. Hold this position for 5 seconds. 4. Repeat 10-20 times, then switch legs and do it again. Complete this exercise 1-2 times per day.  Straight Leg Raises 1. Lie on your back with one leg extended and your opposite knee bent. 2. Tighten the muscles in the front of the thigh of your extended leg. Your thigh may shake slightly. 3. Tighten these muscles even more and raise your leg 4-6 inches off the floor. Hold for 3-5 seconds. 4. Keeping these muscles tight, lower your leg. 5. Relax the muscles slowly and completely in between each repetition. 6. Repeat 10-20 times, then switch   legs and do it again. Complete this exercise 1-2 times per day.  Arm Chair Push-ups 1. Find a firm, non-wheeled chair with solid armrests. 2. Sitting in the chair, extend one leg straight out in front of you. 3. Lift up your body weight, using your arms and opposite leg. 4. Slowly lower your body weight. 5. Repeat 10-20 times, then switch legs and do it again. Complete this exercise 1-2 times per day.  This information is not intended to replace advice given to you by your health care provider. Make sure you discuss any questions you have with your health care provider. Document Released: 01/31/2011 Document Revised: 03/31/2016 Document Reviewed: 01/19/2014 Elsevier Interactive Patient Education  2017 [employer]  Total Knee Replacement Total knee replacement is a surgery to replace your knee joint with a man-made (prosthetic) joint. The man-made joint is called a prosthesis. It replaces parts of the thigh bone (femur), lower leg bone (tibia), and kneecap (patella). This surgery is done to lessen pain and improve knee  movement. What happens before the procedure?  Ask your doctor about: ? Changing or stopping your normal medicines. This is especially important if you take diabetes medicines or blood thinners. ? Taking medicines such as aspirin and ibuprofen. These medicines can thin your blood. Do not take these medicines before your procedure if your doctor tells you not to.  Get all dental care that you need done before your procedure. Plan to not have dental work done for 3 months after your surgery.  Follow instructions from your doctor about what you cannot eat or drink.  Ask your doctor how your surgical site will be marked or identified.  You may be given antibiotic medicine to help prevent infection.  If your doctor prescribes physical therapy, do exercises as told.  Do not use any tobacco products, such as cigarettes, chewing tobacco, or e-cigarettes. If you need help quitting, ask your doctor.  You may have a physical exam.  You may have tests, such as: ? X-rays. ? MRI. ? CT scan. ? Bone scans.  You may have a blood or urine sample taken.  Plan to have someone take you home after the procedure.  If you will be going home right after the procedure, plan to have someone with you for at least 24 hours. It is best to have someone help care for you for at least 4-6 weeks after surgery. What happens during the procedure?  To reduce your risk of infection: ? Your health care team will wash or sanitize their hands. ? Your skin will be washed with soap.  An IV tube will be put into one of your veins.  You will be given one or more of the following: ? Sedative. This is a medicine that makes you relaxed. ? Local anesthetic. This is a medicine to numb the area. ? General anesthetic. This is a medicine that makes you fall asleep. ? Spinal anesthetic. This is a medicine that numbs your body below the waist. ? Regional anesthetic. This is a medicine that numbs everything below the injection  site.  A cut (incision) will be made in your knee.  Damaged parts of your thigh bone, lower leg bone, and kneecap will be removed.  A piece of metal (liner) will be placed on your thigh bone. Pieces of plastic will be placed on your lower leg bone and the underside of your kneecap.  One or more small tubes (drains) may be placed near your cut to help drain   fluid.  Your cut will be closed with stitches (sutures), skin glue, or skin tape (adhesive) strips. Medicine may be put on your cut.  A bandage (dressing) will be placed over your cut. The procedure may vary among doctors and hospitals. What happens after the procedure?  Your blood pressure, heart rate, breathing rate, and blood oxygen level will be monitored often until the medicines you were given have worn off.  You may continue to get fluids and medicines through an IV tube.  You will have some pain. There will be medicines to help you.  You may have fluid coming from a drain.  You may have to wear special socks (compression stockings). These help to prevent blood clots and reduce swelling in your legs.  You will be told to move around as much as possible.  You may be given a continuous passive motion machine to use at home. You will be shown how to use this machine.  Do not drive for 24 hours if you received a sedative. This information is not intended to replace advice given to you by your health care provider. Make sure you discuss any questions you have with your health care provider. Document Released: 01/19/2012 Document Revised: 06/30/2016 Document Reviewed: 10/03/2015 Elsevier Interactive Patient Education  2017 Elsevier Inc.  

## 2017-01-26 NOTE — Progress Notes (Signed)
Patient ID: Danielle Hicks, female   DOB: July 23, 1956, 61 y.o.   MRN: [id number]  Chief Complaint  Patient presents with  . Knee Problem    Right knee pain     HPI Danielle Hicks is a 61 y.o. female.  This had pain in her knee for for 5 years she eventually had an MRI of the knee due to persistent pain which didn't respond to ibuprofen and injection and presents now for review of that MRI and discussion of knee surgery  The MRI showed arthritis of the knee no cartilage tear.  Talking to her further she indicates she stop smoking and stop for 2 weeks she does not have diabetes she has medication controlled hypertension  Her knee pain is starting to interfere with her activities of daily living causing her more problems. Most of the pain is medial with occasional diffuse pain  She's also taking ibuprofen which did not help the injections help at times  Review of Systems Review of Systems  Respiratory: Negative for shortness of breath.   Cardiovascular: Negative for chest pain.   (2 MINIMUM)  Past Medical History:  Diagnosis Date  . Diabetes mellitus without complication ([place])   . GERD (gastroesophageal reflux disease)   . Hypertension     Past Surgical History:  Procedure Laterality Date  . ABDOMINAL HYSTERECTOMY      Social History Social History  Substance Use Topics  . Smoking status: Current Every Day Smoker    Packs/day: 1.00  . Smokeless tobacco: Never Used  . Alcohol use No    No Known Allergies  Current Meds  Medication Sig  . albuterol (PROVENTIL HFA;VENTOLIN HFA) 108 (90 Base) MCG/ACT inhaler Inhale 2 puffs into the lungs every 4 (four) hours as needed for wheezing or shortness of breath.  . clobetasol cream (TEMOVATE) 3.73 % Apply 1 application topically 2 (two) times daily.  . clotrimazole-betamethasone (LOTRISONE) cream Apply topically 2 (two) times daily.  Marland Kitchen lisinopril-hydrochlorothiazide (PRINZIDE,ZESTORETIC) 10-12.5 MG tablet Take 1 tablet by  mouth daily.  . Multiple Vitamins-Calcium (ONE-A-DAY WOMENS FORMULA PO) Take by mouth.  . pantoprazole (PROTONIX) 40 MG tablet Take 1 tablet (40 mg total) by mouth daily.      Physical Exam Physical Exam 1.BP 117/78   Pulse 63   Ht 5' 1.5" (1.562 m)   Wt 176 lb (79.8 kg)   BMI 32.72 kg/m   2. Gen. appearance. The patient is well-developed and well-nourished, grooming and hygiene are normal. There are no gross congenital abnormalities  3. The patient is alert and oriented to person place and time  4. Mood and affect are normal  5. Ambulation Normal  Examination reveals the following:    MEDICAL DECISION MAKING:    Data Reviewed MRI and plain film independent interpretation is that she has diffuse arthritis primarily medial compartment  Assessment Encounter Diagnosis  Name Primary?  Marland Kitchen Arthritis of right knee Yes     Plan The patient was advised on the complications and benefits of knee replacement and we spent 25 minutes or more speaking and talking about that  She is not ready for surgery she would like to think about it  If she does decide to have surgery she will need to come in for a preoperative history and physical examination  Arther Abbott 01/26/2017, 3:32 PM

## 2017-01-27 ENCOUNTER — Telehealth: Payer: Self-pay | Admitting: *Deleted

## 2017-01-27 DIAGNOSIS — H9319 Tinnitus, unspecified ear: Secondary | ICD-10-CM

## 2017-01-27 NOTE — Telephone Encounter (Signed)
Received call from patient.   Reports that she continues to have ringing in her ears, ans states that MD was going to send to ENT if ringing did not improve.   MD please advise.

## 2017-01-27 NOTE — Telephone Encounter (Signed)
Okay to send referral- ringing in ears/tinnitus

## 2017-01-28 NOTE — Telephone Encounter (Signed)
Referral placed.

## 2017-01-29 ENCOUNTER — Encounter (INDEPENDENT_AMBULATORY_CARE_PROVIDER_SITE_OTHER): Payer: Self-pay | Admitting: Internal Medicine

## 2017-02-12 ENCOUNTER — Telehealth: Payer: Self-pay | Admitting: Family Medicine

## 2017-02-12 NOTE — Telephone Encounter (Signed)
Has questions regarding ENT referral please call back

## 2017-02-13 NOTE — Telephone Encounter (Signed)
Rec'd fax from [facility] that they had tried to contact pt several times and she has not returned their calls.  I called pt and left number to [facility] and also told her to call them.

## 2017-03-11 ENCOUNTER — Ambulatory Visit (INDEPENDENT_AMBULATORY_CARE_PROVIDER_SITE_OTHER): Payer: BLUE CROSS/BLUE SHIELD | Admitting: Internal Medicine

## 2017-03-12 DIAGNOSIS — H9313 Tinnitus, bilateral: Secondary | ICD-10-CM | POA: Insufficient documentation

## 2017-03-16 ENCOUNTER — Ambulatory Visit (INDEPENDENT_AMBULATORY_CARE_PROVIDER_SITE_OTHER): Payer: BLUE CROSS/BLUE SHIELD | Admitting: Internal Medicine

## 2017-03-26 ENCOUNTER — Ambulatory Visit (INDEPENDENT_AMBULATORY_CARE_PROVIDER_SITE_OTHER): Payer: BLUE CROSS/BLUE SHIELD | Admitting: Internal Medicine

## 2017-03-26 ENCOUNTER — Encounter (INDEPENDENT_AMBULATORY_CARE_PROVIDER_SITE_OTHER): Payer: Self-pay | Admitting: Internal Medicine

## 2017-03-26 VITALS — BP 140/90 | HR 72 | Temp 97.4°F | Ht 61.0 in | Wt 182.3 lb

## 2017-03-26 DIAGNOSIS — R197 Diarrhea, unspecified: Secondary | ICD-10-CM | POA: Diagnosis not present

## 2017-03-26 NOTE — Patient Instructions (Signed)
OV in 1 year.  

## 2017-03-26 NOTE — Progress Notes (Signed)
   Subjective:    Patient ID: Danielle Hicks, female    DOB: 01/18/1956, 61 y.o.   MRN: [id number]  HPI  Here today for f/u. Last seen In May of 2017. Hx of diarrhea. At [place] in March her diarrhea had resolved. She had been started on Viberzi.  She tells me she is not taking Viberzi. She has a BM x 1 a day. Stools are much better. Stools are formed. She has quit smoking. She has gained about 9 pounds since her last visit.  She take Protonix for her acid reflux.    Her last colonoscopy was about 6 yrs ago and was normal per patient.     Review of Systems Past Medical History:  Diagnosis Date  . Diabetes mellitus without complication ([place])   . GERD (gastroesophageal reflux disease)   . Hypertension     Past Surgical History:  Procedure Laterality Date  . ABDOMINAL HYSTERECTOMY      No Known Allergies  Current Outpatient Prescriptions on File Prior to Visit  Medication Sig Dispense Refill  . lisinopril-hydrochlorothiazide (PRINZIDE,ZESTORETIC) 10-12.5 MG tablet Take 1 tablet by mouth daily. 30 tablet 6  . Multiple Vitamins-Calcium (ONE-A-DAY WOMENS FORMULA PO) Take by mouth.    . pantoprazole (PROTONIX) 40 MG tablet Take 1 tablet (40 mg total) by mouth daily. 30 tablet 3   No current facility-administered medications on file prior to visit.        Objective:   Physical Exam Blood pressure 140/90, pulse 72, temperature 97.4 F (36.3 C), height 5\' 1"  (1.549 m), weight 182 lb 4.8 oz (82.7 kg). Alert and oriented. Skin warm and dry. Oral mucosa is moist.   . Sclera anicteric, conjunctivae is pink. Thyroid not enlarged. No cervical lymphadenopathy. Lungs clear. Heart regular rate and rhythm.  Abdomen is soft. Bowel sounds are positive. No hepatomegaly. No abdominal masses felt. No tenderness.  No edema to lower extremities.          Assessment & Plan:  Diarrhea resolved. She is doing well. OV in 1 year.

## 2017-04-22 ENCOUNTER — Encounter: Payer: Self-pay | Admitting: Family Medicine

## 2017-04-22 ENCOUNTER — Ambulatory Visit (INDEPENDENT_AMBULATORY_CARE_PROVIDER_SITE_OTHER): Payer: BLUE CROSS/BLUE SHIELD | Admitting: Family Medicine

## 2017-04-22 VITALS — BP 138/88 | HR 64 | Temp 98.4°F | Resp 14 | Ht 61.0 in | Wt 173.0 lb

## 2017-04-22 DIAGNOSIS — E1165 Type 2 diabetes mellitus with hyperglycemia: Secondary | ICD-10-CM

## 2017-04-22 DIAGNOSIS — R5383 Other fatigue: Secondary | ICD-10-CM

## 2017-04-22 DIAGNOSIS — M545 Low back pain, unspecified: Secondary | ICD-10-CM

## 2017-04-22 DIAGNOSIS — E119 Type 2 diabetes mellitus without complications: Secondary | ICD-10-CM

## 2017-04-22 DIAGNOSIS — IMO0001 Reserved for inherently not codable concepts without codable children: Secondary | ICD-10-CM

## 2017-04-22 LAB — URINALYSIS, ROUTINE W REFLEX MICROSCOPIC
Bilirubin Urine: NEGATIVE
Hgb urine dipstick: NEGATIVE
Leukocytes, UA: NEGATIVE
Nitrite: NEGATIVE
Protein, ur: NEGATIVE
Specific Gravity, Urine: 1.005 (ref 1.001–1.035)
pH: 5 (ref 5.0–8.0)

## 2017-04-22 LAB — URINALYSIS, MICROSCOPIC ONLY
Bacteria, UA: NONE SEEN [HPF]
Casts: NONE SEEN [LPF]
Crystals: NONE SEEN [HPF]
RBC / HPF: NONE SEEN RBC/HPF (ref <=2)
WBC, UA: NONE SEEN WBC/HPF (ref <=5)
Yeast: NONE SEEN [HPF]

## 2017-04-22 LAB — HEMOGLOBIN A1C, FINGERSTICK: Hgb A1C (fingerstick): 11.8 % — ABNORMAL HIGH (ref <5.7)

## 2017-04-22 MED ORDER — METFORMIN HCL 500 MG PO TABS: 500.0000 mg | ORAL_TABLET | Freq: Two times a day (BID) | ORAL | 3 refills | Status: DC

## 2017-04-22 NOTE — Progress Notes (Addendum)
   Subjective:    Patient ID: Danielle Hicks, female    DOB: 25-Feb-1956, 61 y.o.   MRN: [id number]  Patient presents for Fatigue (states that she had dehydration and back pain, but now feels very fatigued)   Pt here with decresaed energy, fatigue, after walking with daughter a few weeks ago. Had some chest discomfort and some mid thoaraic and low back pain.  She took some type of vitamin Drink 1 week ago.  She quit smoking around that same time.  Diabetes- ;last a1c was 6.4%, has been very thirsty, and urinary frequecny, and feels fatigued with with little energy    Review Of Systems:  GEN-+ fatigue, fever, weight loss,weakness, recent illness HEENT- denies eye drainage, change in vision, nasal discharge, CVS- denies chest pain, palpitations RESP- denies SOB, cough, wheeze ABD- denies N/V, change in stools, abd pain GU- denies dysuria, hematuria, dribbling, incontinence MSK- denies joint pain, muscle aches, injury Neuro- denies headache, dizziness, syncope, seizure activity       Objective:    BP 138/88   Pulse 64   Temp 98.4 F (36.9 C) (Oral)   Resp 14   Ht 5\' 1"  (1.549 m)   Wt 173 lb (78.5 kg)   SpO2 98%   BMI 32.69 kg/m  GEN- NAD, alert and oriented x3 HEENT- PERRL, EOMI, non injected sclera, pink conjunctiva, MMM, oropharynx clear Neck- Supple, no thyromegaly CVS- RRR, no murmur RESP-CTAB ABD-NABS,soft,NT,ND,no CVA tenderness  MSK-Spine NT, FROM, motor equal bilat,  EXT- No edema Pulses- Radial 2+        Assessment & Plan:      Problem List Items Addressed This Visit    Uncontrolled diabetes mellitus ([place])    Patient has worsening of her diabetes mellitus. Her A1c returned today at 11.8%. On the restart her on metformin 500 mg twice a day which she has responded well to in the past. She is to check her blood sugars twice a day she has a glucometer at home. She will follow-up in 4 weeks for recheck on her diabetes. I also sent off metabolic panel check  her renal function and electrolytes. Her blood pressure is stable in the office. Her urinalysis showed some mild ketones as well as with case which is expected with her elevated A1c.      Relevant Medications   metFORMIN (GLUCOPHAGE) 500 MG tablet    Other Visit Diagnoses    Acute low back pain without sciatica, unspecified back pain laterality    -  Primary   Relevant Orders   Urinalysis, Routine w reflex microscopic (Completed)   Other fatigue       Relevant Orders   TSH (Completed)      Note: This dictation was prepared with Dragon dictation along with smaller phrase technology. Any transcriptional errors that result from this process are unintentional.     ADDENDUM- Was paged at 6:20 with blood sugar of 595.  Bicarb was 24.  Not acidotic. Anion gap of 12.  Will have the patient come in ASAP this am for IVF, recheck sugar, and discuss adding insulin vs glipizide for more rapid correction of hyperglycemia in addition to metformin added yesterday.

## 2017-04-22 NOTE — Patient Instructions (Signed)
Start metformin twice a day  Check twice a day , fasting and before bedtime  F/U 4 weeks and bring your meter

## 2017-04-22 NOTE — Assessment & Plan Note (Signed)
Patient has worsening of her diabetes mellitus. Her A1c returned today at 11.8%. On the restart her on metformin 500 mg twice a day which she has responded well to in the past. She is to check her blood sugars twice a day she has a glucometer at home. She will follow-up in 4 weeks for recheck on her diabetes. I also sent off metabolic panel check her renal function and electrolytes. Her blood pressure is stable in the office. Her urinalysis showed some mild ketones as well as with case which is expected with her elevated A1c.

## 2017-04-23 ENCOUNTER — Encounter: Payer: Self-pay | Admitting: Family Medicine

## 2017-04-23 ENCOUNTER — Ambulatory Visit (INDEPENDENT_AMBULATORY_CARE_PROVIDER_SITE_OTHER): Payer: BLUE CROSS/BLUE SHIELD | Admitting: Family Medicine

## 2017-04-23 ENCOUNTER — Telehealth: Payer: Self-pay | Admitting: *Deleted

## 2017-04-23 VITALS — BP 134/78 | HR 78 | Temp 99.1°F | Resp 14 | Ht 61.0 in | Wt 173.0 lb

## 2017-04-23 DIAGNOSIS — M545 Low back pain, unspecified: Secondary | ICD-10-CM

## 2017-04-23 DIAGNOSIS — R7309 Other abnormal glucose: Secondary | ICD-10-CM | POA: Diagnosis not present

## 2017-04-23 DIAGNOSIS — E860 Dehydration: Secondary | ICD-10-CM

## 2017-04-23 DIAGNOSIS — E1165 Type 2 diabetes mellitus with hyperglycemia: Secondary | ICD-10-CM

## 2017-04-23 DIAGNOSIS — IMO0001 Reserved for inherently not codable concepts without codable children: Secondary | ICD-10-CM

## 2017-04-23 LAB — CBC WITH DIFFERENTIAL/PLATELET
Basophils Absolute: 0 cells/uL (ref 0–200)
Basophils Relative: 0 %
Eosinophils Absolute: 160 cells/uL (ref 15–500)
Eosinophils Relative: 2 %
HCT: 39.5 % (ref 35.0–45.0)
Hemoglobin: 12.2 g/dL (ref 12.0–15.0)
Lymphocytes Relative: 43 %
Lymphs Abs: 3440 cells/uL (ref 850–3900)
MCH: 26.6 pg — ABNORMAL LOW (ref 27.0–33.0)
MCHC: 30.9 g/dL — ABNORMAL LOW (ref 32.0–36.0)
MCV: 86.2 fL (ref 80.0–100.0)
MPV: 10.7 fL (ref 7.5–12.5)
Monocytes Absolute: 400 cells/uL (ref 200–950)
Monocytes Relative: 5 %
Neutro Abs: 4000 cells/uL (ref 1500–7800)
Neutrophils Relative %: 50 %
Platelets: 313 10*3/uL (ref 140–400)
RBC: 4.58 MIL/uL (ref 3.80–5.10)
RDW: 15.8 % — ABNORMAL HIGH (ref 11.0–15.0)
WBC: 8 10*3/uL (ref 3.8–10.8)

## 2017-04-23 LAB — COMPREHENSIVE METABOLIC PANEL
ALT: 18 U/L (ref 6–29)
AST: 14 U/L (ref 10–35)
Albumin: 4.1 g/dL (ref 3.6–5.1)
Alkaline Phosphatase: 113 U/L (ref 33–130)
BUN: 19 mg/dL (ref 7–25)
CO2: 24 mmol/L (ref 20–31)
Calcium: 9.4 mg/dL (ref 8.6–10.4)
Chloride: 95 mmol/L — ABNORMAL LOW (ref 98–110)
Creat: 1.01 mg/dL (ref 0.50–1.05)
Glucose, Bld: 594 mg/dL (ref 70–99)
Potassium: 4.1 mmol/L (ref 3.5–5.3)
Sodium: 131 mmol/L — ABNORMAL LOW (ref 135–146)
Total Bilirubin: 0.5 mg/dL (ref 0.2–1.2)
Total Protein: 6.8 g/dL (ref 6.1–8.1)

## 2017-04-23 LAB — TSH: TSH: 1.58 mIU/L

## 2017-04-23 LAB — GLUCOSE, FINGERSTICK (STAT): Glucose, fingerstick: 335 mg/dL — ABNORMAL HIGH (ref 65–99)

## 2017-04-23 MED ORDER — GLIPIZIDE ER 10 MG PO TB24: 10.0000 mg | ORAL_TABLET | Freq: Every day | ORAL | 5 refills | Status: DC

## 2017-04-23 NOTE — Telephone Encounter (Signed)
Call placed to patient and patient made aware. Appointment scheduled.   Patient supervisor Coralyn Mark notified that patient will be leaving work to come to office for treatment.

## 2017-04-23 NOTE — Telephone Encounter (Signed)
-----   Message from Susy Frizzle, MD sent at 04/23/2017  6:27 AM EDT ----- Needs to come in ASAP for IV fluids and to start on additional medications and recheck sugar.  Was paged at 6:20 with sugar of 595.

## 2017-04-23 NOTE — Progress Notes (Signed)
Subjective:    Patient ID: Danielle Hicks, female    DOB: 01-Oct-1956, 61 y.o.   MRN: [id number]  HPI I was paged this morning with a critical value of a blood sugar 595. Patient's bicarbonate was 24. Her potassium was normal at 4.5. She was not acidotic. Anion gap is 12. I reviewed the patient's last office visit or she was complaining of dehydration, fatigue, and dry mouth. Her hemoglobin A1c yesterday was 11.8 and she was started back on metformin however given the critical value of the blood sugar, I was concerned that the patient may decompensate prior to the metformin taking full effect. She is here today to discuss options Past Medical History:  Diagnosis Date  . Diabetes mellitus without complication ([place])   . GERD (gastroesophageal reflux disease)   . Hypertension    Past Surgical History:  Procedure Laterality Date  . ABDOMINAL HYSTERECTOMY     Current Outpatient Prescriptions on File Prior to Visit  Medication Sig Dispense Refill  . lisinopril-hydrochlorothiazide (PRINZIDE,ZESTORETIC) 10-12.5 MG tablet Take 1 tablet by mouth daily. 30 tablet 6  . metFORMIN (GLUCOPHAGE) 500 MG tablet Take 1 tablet (500 mg total) by mouth 2 (two) times daily with a meal. 60 tablet 3  . Multiple Vitamins-Calcium (ONE-A-DAY WOMENS FORMULA PO) Take by mouth.    . pantoprazole (PROTONIX) 40 MG tablet Take 1 tablet (40 mg total) by mouth daily. 30 tablet 3   No current facility-administered medications on file prior to visit.    No Known Allergies Social History   Social History  . Marital status: Married    Spouse name: N/A  . Number of children: N/A  . Years of education: N/A   Occupational History  . Not on file.   Social History Main Topics  . Smoking status: Former Smoker    Packs/day: 1.00  . Smokeless tobacco: Never Used  . Alcohol use No  . Drug use: No  . Sexual activity: Yes   Other Topics Concern  . Not on file   Social History Narrative  . No narrative on file       Review of Systems  All other systems reviewed and are negative.      Objective:   Physical Exam  HENT:  Mouth/Throat: Mucous membranes are dry.  Cardiovascular: Normal rate, regular rhythm and normal heart sounds.   No murmur heard. Pulmonary/Chest: Effort normal and breath sounds normal. No respiratory distress. She has no wheezes. She has no rales.  Abdominal: Soft. Bowel sounds are normal.  Vitals reviewed.         Assessment & Plan:  Elevated glucose - Plan: Glucose, fingerstick (stat)  Uncontrolled type 2 diabetes mellitus without complication, without long-term current use of insulin (HCC)  Acute low back pain without sciatica, unspecified back pain laterality  Dehydration  Patient is dehydrated. Thankfully she is not acidotic nor is she in DKA. Fingerstick blood sugar this morning is in the 300s and is not as critically high as it was yesterday. Because of her dehydration we did give the patient 1 L of normal saline as a bolus. I will her to rest for the next 2 days and remain out of work and focus on staying hydrated by drinking plenty of water. Continue metformin but I believe we need to start something that will work more immediately to bring her sugars down. I will start the patient on glipizide extended release 10 mg by mouth every morning. She is to check her blood  sugar every day. She can discontinue the medication if she starts to find fasting blood sugars below 100.  Will discuss tomorrow with PCP who is out of office today to determine follow up

## 2017-04-23 NOTE — Addendum Note (Signed)
Addended by: Jenna Luo T on: 04/23/2017 01:57 PM   Modules accepted: Orders

## 2017-04-29 ENCOUNTER — Encounter: Payer: Self-pay | Admitting: Family Medicine

## 2017-04-29 ENCOUNTER — Ambulatory Visit (INDEPENDENT_AMBULATORY_CARE_PROVIDER_SITE_OTHER): Payer: BLUE CROSS/BLUE SHIELD | Admitting: Family Medicine

## 2017-04-29 VITALS — BP 138/78 | HR 72 | Temp 98.4°F | Resp 14 | Ht 61.0 in | Wt 169.0 lb

## 2017-04-29 DIAGNOSIS — IMO0001 Reserved for inherently not codable concepts without codable children: Secondary | ICD-10-CM

## 2017-04-29 DIAGNOSIS — I10 Essential (primary) hypertension: Secondary | ICD-10-CM | POA: Diagnosis not present

## 2017-04-29 DIAGNOSIS — E1165 Type 2 diabetes mellitus with hyperglycemia: Secondary | ICD-10-CM

## 2017-04-29 DIAGNOSIS — R079 Chest pain, unspecified: Secondary | ICD-10-CM

## 2017-04-29 MED ORDER — METFORMIN HCL 1000 MG PO TABS: 1000.0000 mg | ORAL_TABLET | Freq: Two times a day (BID) | ORAL | 3 refills | Status: DC

## 2017-04-29 NOTE — Assessment & Plan Note (Signed)
Bp looks okay, no change to lisinopril HCTZ, renal function preserved on labs

## 2017-04-29 NOTE — Assessment & Plan Note (Signed)
Increase metformin to 1000mg  BID, continue glioizide She is eating a lot of carbs in form of bread/pasta, potatoes,. Discussed no snacks no bread/ "avoid white carbs" Denies any polyuria/polydipsia She can return to work on Monday

## 2017-04-29 NOTE — Patient Instructions (Addendum)
Increase your metformin 1000mg  twice a day  Continue Glpizide Referral to heart doctor, go to ER if pain gets worse You can try ibuprofen with food F/U 4 weeks
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
## 2017-05-14 ENCOUNTER — Ambulatory Visit: Payer: BLUE CROSS/BLUE SHIELD | Admitting: Cardiology

## 2017-05-27 ENCOUNTER — Ambulatory Visit: Payer: BLUE CROSS/BLUE SHIELD | Admitting: Family Medicine

## 2017-05-29 ENCOUNTER — Encounter: Payer: Self-pay | Admitting: Family Medicine

## 2017-06-01 ENCOUNTER — Encounter: Payer: Self-pay | Admitting: Cardiology

## 2017-06-01 NOTE — Progress Notes (Signed)
Cardiology Office Note  Date: 06/02/2017   ID: Danielle Hicks, DOB Mar 30, 1956, MRN [id number]  PCP: Alycia Rossetti, MD  Consulting Cardiologist: Rozann Lesches, MD   Chief Complaint  Patient presents with  . Chest Pain    History of Present Illness: Danielle Hicks is a 61 y.o. female referred for cardiology consultation by Dr. Buelah Manis for the evaluation of chest pain - I reviewed the recent office note from June. She states that over the last several weeks she has been experiencing intermittent episodes of chest discomfort, describes this as a tightness on the right side occasionally, sometimes more of a sharp pain. She reports that her blood sugars have been significantly out of control, she had actually stopped taking diabetic medications for a few years but is now back on. She has had some limitation in functional capacity, but feels like it is getting somewhat better now that her blood sugars are getting under better control. She works full-time, is on her feet a lot of the day. Also reports having other family stressors.  She has not undergone any prior ischemic cardiac testing. I reviewed her ECG from June which showed sinus rhythm with nonspecific ST segment abnormalities, likely repolarization changes. Personal cardiac risk factors include hypertension, tobacco use and type 2 diabetes mellitus.  Past Medical History:  Diagnosis Date  . GERD (gastroesophageal reflux disease)   . Hypertension   . Type 2 diabetes mellitus ([place])     Past Surgical History:  Procedure Laterality Date  . ABDOMINAL HYSTERECTOMY      Current Outpatient Prescriptions  Medication Sig Dispense Refill  . glipiZIDE (GLUCOTROL XL) 10 MG 24 hr tablet Take 1 tablet (10 mg total) by mouth daily with breakfast. 30 tablet 5  . lisinopril-hydrochlorothiazide (PRINZIDE,ZESTORETIC) 10-12.5 MG tablet Take 1 tablet by mouth daily. 30 tablet 6  . metFORMIN (GLUCOPHAGE) 500 MG tablet Take 500 mg by mouth  2 (two) times daily with a meal.    . Multiple Vitamins-Calcium (ONE-A-DAY WOMENS FORMULA PO) Take by mouth.    . pantoprazole (PROTONIX) 40 MG tablet Take 1 tablet (40 mg total) by mouth daily. 30 tablet 3   No current facility-administered medications for this visit.    Allergies:  Penicillins   Social History: The patient  reports that she has been smoking Cigarettes.  She has never used smokeless tobacco. She reports that she does not drink alcohol or use drugs.   Family History: The patient's family history includes Diabetes in her mother; Hypertension in her mother; Stroke in her sister.   ROS:  Please see the history of present illness. Otherwise, complete review of systems is positive for none.  All other systems are reviewed and negative.   Physical Exam: VS:  BP 112/88   Pulse 72   Ht 5\' 1"  (1.549 m)   Wt 169 lb (76.7 kg)   SpO2 98%   BMI 31.93 kg/m , BMI Body mass index is 31.93 kg/m.  Wt Readings from Last 3 Encounters:  06/02/17 169 lb (76.7 kg)  04/29/17 169 lb (76.7 kg)  04/23/17 173 lb (78.5 kg)    General: Overweight woman, appears comfortable at rest. HEENT: Conjunctiva and lids normal, oropharynx clear. Neck: Supple, no elevated JVP or carotid bruits, no thyromegaly. Lungs: Clear to auscultation, nonlabored breathing at rest. Cardiac: Regular rate and rhythm, no S3, 2/6 systolic murmur, no pericardial rub. Abdomen: Soft, nontender, bowel sounds present, no guarding or rebound. Extremities: No pitting edema, distal pulses  2+. Skin: Warm and dry. Musculoskeletal: No kyphosis. Neuropsychiatric: Alert and oriented x3, affect grossly appropriate.  ECG: I personally reviewed the tracing from 04/29/2017 which showed normal sinus rhythm with nonspecific ST segment abnormalities, likely repolarization changes.  Recent Labwork: 04/22/2017: ALT 18; AST 14; BUN 19; Creat 1.01; Hemoglobin 12.2; Platelets 313; Potassium 4.1; Sodium 131; TSH 1.58     Component Value  Date/Time   CHOL 176 02/19/2016 0813   TRIG 66 02/19/2016 0813   HDL 47 02/19/2016 0813   CHOLHDL 3.7 02/19/2016 0813   VLDL 13 02/19/2016 0813   LDLCALC 116 02/19/2016 0813    Assessment and Plan:  1. Recurrent precordial pain, typical and atypical features for angina. ECG at baseline shows nonspecific ST segment abnormalities. Personal cardiac risk factors include hypertension, type 2 diabetes mellitus, and history of tobacco use. She has not undergone any prior ischemic testing. We will proceed with an exercise echocardiogram.  2. Type 2 diabetes mellitus, recently not on regular medications and with poorly controlled blood sugars. She states that she is now addressing this regularly with Dr. Buelah Manis is back on medical therapy. Hemoglobin A1c was 11.8 in June.  3. Essential hypertension, blood pressure is well controlled today. She is on Prinzide.  4. Tobacco abuse. Smoking cessation indicated.  Current medicines were reviewed with the patient today.   Orders Placed This Encounter  Procedures  . ECHOCARDIOGRAM STRESS TEST    Disposition: Call with test results.  Signed, Satira Sark, MD, [facility] 06/02/2017 3:28 PM    [facility], [place], [place] [address] Phone: [phone]; Fax: [phone]

## 2017-06-02 ENCOUNTER — Encounter: Payer: Self-pay | Admitting: Cardiology

## 2017-06-02 ENCOUNTER — Telehealth: Payer: Self-pay | Admitting: Cardiology

## 2017-06-02 ENCOUNTER — Ambulatory Visit (INDEPENDENT_AMBULATORY_CARE_PROVIDER_SITE_OTHER): Payer: BLUE CROSS/BLUE SHIELD | Admitting: Cardiology

## 2017-06-02 VITALS — BP 112/88 | HR 72 | Ht 61.0 in | Wt 169.0 lb

## 2017-06-02 DIAGNOSIS — E1165 Type 2 diabetes mellitus with hyperglycemia: Secondary | ICD-10-CM | POA: Diagnosis not present

## 2017-06-02 DIAGNOSIS — Z720 Tobacco use: Secondary | ICD-10-CM

## 2017-06-02 DIAGNOSIS — I10 Essential (primary) hypertension: Secondary | ICD-10-CM | POA: Diagnosis not present

## 2017-06-02 DIAGNOSIS — IMO0001 Reserved for inherently not codable concepts without codable children: Secondary | ICD-10-CM

## 2017-06-02 DIAGNOSIS — R9431 Abnormal electrocardiogram [ECG] [EKG]: Secondary | ICD-10-CM

## 2017-06-02 DIAGNOSIS — R072 Precordial pain: Secondary | ICD-10-CM | POA: Diagnosis not present

## 2017-06-02 NOTE — Patient Instructions (Signed)
Medication Instructions:  Your physician recommends that you continue on your current medications as directed. Please refer to the Current Medication list given to you today.  Labwork: none  Testing/Procedures: Your physician has requested that you have a stress echocardiogram. For further information please visit [email or web address]. Please follow instruction sheet as given.  Follow-Up: Your physician recommends that you schedule a follow-up appointment PENDING TEST RESULTS  Any Other Special Instructions Will Be Listed Below (If Applicable).  If you need a refill on your cardiac medications before your next appointment, please call your pharmacy. 

## 2017-06-02 NOTE — Telephone Encounter (Signed)
Pre-cert Verification for the following procedure   Stress Echo scheduled for 06/10/17 at [facility]

## 2017-06-05 ENCOUNTER — Ambulatory Visit (INDEPENDENT_AMBULATORY_CARE_PROVIDER_SITE_OTHER): Payer: BLUE CROSS/BLUE SHIELD | Admitting: Family Medicine

## 2017-06-05 ENCOUNTER — Encounter: Payer: Self-pay | Admitting: Family Medicine

## 2017-06-05 VITALS — BP 122/76 | HR 72 | Temp 98.4°F | Resp 14 | Ht 61.0 in | Wt 169.0 lb

## 2017-06-05 DIAGNOSIS — E1165 Type 2 diabetes mellitus with hyperglycemia: Secondary | ICD-10-CM

## 2017-06-05 DIAGNOSIS — IMO0001 Reserved for inherently not codable concepts without codable children: Secondary | ICD-10-CM

## 2017-06-05 MED ORDER — PANTOPRAZOLE SODIUM 40 MG PO TBEC: 40.0000 mg | DELAYED_RELEASE_TABLET | Freq: Every day | ORAL | 3 refills | Status: DC

## 2017-06-05 MED ORDER — METFORMIN HCL 500 MG PO TABS: 500.0000 mg | ORAL_TABLET | Freq: Two times a day (BID) | ORAL | 3 refills | Status: DC

## 2017-06-05 NOTE — Progress Notes (Signed)
   Subjective:    Patient ID: Danielle Hicks, female    DOB: [birth year], 61 y.o.   MRN: [id number]  Patient presents for F/U (is not fasting )  Interimfollow up for DM  Has stress test pending from cardiology     DM- recent A1C 11.8%  , Metformin 1000mg   BID and glipizide, stopeed glipized due to low sugars, then stil had 60's so last week dropped 500mg  twice a day  Energy levels much better  No polyuria , polydipsia  Weight down 4lbs  Reviewed meter - 14 day average 112 checks 1-2x /day  CBG range 60-131 over past 2 weeks     Review Of Systems:  GEN- denies fatigue, fever, weight loss,weakness, recent illness HEENT- denies eye drainage, change in vision, nasal discharge, CVS- denies chest pain, palpitations RESP- denies SOB, cough, wheeze ABD- denies N/V, change in stools, abd pain GU- denies dysuria, hematuria, dribbling, incontinence MSK- denies joint pain, muscle aches, injury Neuro- denies headache, dizziness, syncope, seizure activity       Objective:    BP 122/76   Pulse 72   Temp 98.4 F (36.9 C) (Oral)   Resp 14   Ht 5\' 1"  (1.549 m)   Wt 169 lb (76.7 kg)   SpO2 98%   BMI 31.93 kg/m  GEN- NAD, alert and oriented x3 HEENT- PERRL, EOMI, non injected sclera, pink conjunctiva, MMM, oropharynx clear CVS- RRR, no murmur RESP-CTAB EXT- No edema Pulses- Radial, DP- 2+        Assessment & Plan:      Problem List Items Addressed This Visit    Uncontrolled diabetes mellitus (Danielle Hicks) - Primary    She is getting significant Hypoglycemia Has stopped glipizide For now will keep her on MTF 500mg  BID Now hydrated as well Continue checking cbg regulary        Relevant Medications   metFORMIN (GLUCOPHAGE) 500 MG tablet   Other Relevant Orders   CBC with Differential/Platelet (Completed)   Hemoglobin A1c (Completed)      Note: This dictation was prepared with Dragon dictation along with smaller phrase technology. Any transcriptional errors that result from  this process are unintentional.

## 2017-06-05 NOTE — Patient Instructions (Signed)
F/U 3 months  Continue metformin 500mg  twice a day

## 2017-06-06 LAB — CBC WITH DIFFERENTIAL/PLATELET
Basophils Absolute: 0 cells/uL (ref 0–200)
Basophils Relative: 0 %
Eosinophils Absolute: 190 cells/uL (ref 15–500)
Eosinophils Relative: 2 %
HCT: 34.1 % — ABNORMAL LOW (ref 35.0–45.0)
Hemoglobin: 11.1 g/dL — ABNORMAL LOW (ref 12.0–15.0)
Lymphocytes Relative: 41 %
Lymphs Abs: 3895 cells/uL (ref 850–3900)
MCH: 26.6 pg — ABNORMAL LOW (ref 27.0–33.0)
MCHC: 32.6 g/dL (ref 32.0–36.0)
MCV: 81.8 fL (ref 80.0–100.0)
MPV: 9.5 fL (ref 7.5–12.5)
Monocytes Absolute: 475 cells/uL (ref 200–950)
Monocytes Relative: 5 %
Neutro Abs: 4940 cells/uL (ref 1500–7800)
Neutrophils Relative %: 52 %
Platelets: 404 10*3/uL — ABNORMAL HIGH (ref 140–400)
RBC: 4.17 MIL/uL (ref 3.80–5.10)
RDW: 16.4 % — ABNORMAL HIGH (ref 11.0–15.0)
WBC: 9.5 10*3/uL (ref 3.8–10.8)

## 2017-06-06 LAB — HEMOGLOBIN A1C
Hgb A1c MFr Bld: 9.6 % — ABNORMAL HIGH (ref <5.7)
Mean Plasma Glucose: 229 mg/dL

## 2017-06-07 ENCOUNTER — Encounter: Payer: Self-pay | Admitting: Family Medicine

## 2017-06-07 NOTE — Assessment & Plan Note (Addendum)
She is getting significant Hypoglycemia Has stopped glipizide For now will keep her on MTF 500mg  BID Now hydrated as well Continue checking cbg regulary   Obtain intermin A1C due to quick change in CBG and hypoglycemia symptoms, now 6 weeks on medications

## 2017-06-10 ENCOUNTER — Ambulatory Visit (HOSPITAL_COMMUNITY)
Admission: RE | Admit: 2017-06-10 | Discharge: 2017-06-10 | Disposition: A | Discharge status: Home / Self Care | Payer: BLUE CROSS/BLUE SHIELD | Source: Ambulatory Visit | Attending: Cardiology | Admitting: Cardiology

## 2017-06-10 ENCOUNTER — Telehealth: Payer: Self-pay

## 2017-06-10 DIAGNOSIS — Z720 Tobacco use: Secondary | ICD-10-CM | POA: Diagnosis not present

## 2017-06-10 DIAGNOSIS — I10 Essential (primary) hypertension: Secondary | ICD-10-CM | POA: Insufficient documentation

## 2017-06-10 DIAGNOSIS — R9431 Abnormal electrocardiogram [ECG] [EKG]: Secondary | ICD-10-CM | POA: Diagnosis not present

## 2017-06-10 DIAGNOSIS — R072 Precordial pain: Secondary | ICD-10-CM | POA: Diagnosis present

## 2017-06-10 DIAGNOSIS — E119 Type 2 diabetes mellitus without complications: Secondary | ICD-10-CM | POA: Insufficient documentation

## 2017-06-10 LAB — ECHOCARDIOGRAM STRESS TEST
Estimated workload: 10.1 METS
Exercise duration (min): 6 min
Exercise duration (sec): 50 s
MPHR: 161 {beats}/min
Peak HR: 137 {beats}/min
Percent HR: 85 %
RPE: 17
Rest HR: 77 {beats}/min

## 2017-06-10 NOTE — Telephone Encounter (Signed)
-----   Message from Satira Sark, MD sent at 06/10/2017 11:20 AM EDT ----- Results reviewed. Please let her know that the stress test was reassuring and low risk for obstructive CAD. No further cardiac testing is planned at this time. A copy of this test should be forwarded to [facility], Modena Nunnery, MD.

## 2017-06-10 NOTE — Progress Notes (Signed)
*  PRELIMINARY RESULTS* Echocardiogram Echocardiogram Stress Test has been performed.  Leavy Cella 06/10/2017, 10:43 AM

## 2017-06-10 NOTE — Telephone Encounter (Signed)
Patient notified. Routed to PCP 

## 2017-06-17 ENCOUNTER — Telehealth: Payer: Self-pay | Admitting: Orthopaedic Surgery

## 2017-06-17 NOTE — Telephone Encounter (Signed)
Called patient to notify. Left voice message, offering appointment.

## 2017-06-17 NOTE — Telephone Encounter (Signed)
Patient called to ask if Dr Luna Glasgow can do injection in right knee - she had seen Dr Aline Brochure per Dr Luna Glasgow, regarding evaluation for surgery, however, has not yet elected to proceed.  Please advise.

## 2017-06-17 NOTE — Telephone Encounter (Signed)
I can see for injection while she decides about any surgery.

## 2017-06-30 ENCOUNTER — Ambulatory Visit (INDEPENDENT_AMBULATORY_CARE_PROVIDER_SITE_OTHER): Payer: BLUE CROSS/BLUE SHIELD | Admitting: Orthopaedic Surgery

## 2017-06-30 ENCOUNTER — Encounter: Payer: Self-pay | Admitting: Orthopaedic Surgery

## 2017-06-30 VITALS — BP 136/96 | HR 94 | Temp 98.3°F | Ht 61.0 in | Wt 166.0 lb

## 2017-06-30 DIAGNOSIS — F17210 Nicotine dependence, cigarettes, uncomplicated: Secondary | ICD-10-CM | POA: Diagnosis not present

## 2017-06-30 DIAGNOSIS — G8929 Other chronic pain: Secondary | ICD-10-CM | POA: Diagnosis not present

## 2017-06-30 DIAGNOSIS — M25561 Pain in right knee: Secondary | ICD-10-CM

## 2017-06-30 NOTE — Patient Instructions (Signed)
Steps to Quit Smoking Smoking tobacco can be bad for your health. It can also affect almost every organ in your body. Smoking puts you and people around you at risk for many serious long-lasting (chronic) diseases. Quitting smoking is hard, but it is one of the best things that you can do for your health. It is never too late to quit. What are the benefits of quitting smoking? When you quit smoking, you lower your risk for getting serious diseases and conditions. They can include:  Lung cancer or lung disease.  Heart disease.  Stroke.  Heart attack.  Not being able to have children (infertility).  Weak bones (osteoporosis) and broken bones (fractures).  If you have coughing, wheezing, and shortness of breath, those symptoms may get better when you quit. You may also get sick less often. If you are pregnant, quitting smoking can help to lower your chances of having a baby of low birth weight. What can I do to help me quit smoking? Talk with your doctor about what can help you quit smoking. Some things you can do (strategies) include:  Quitting smoking totally, instead of slowly cutting back how much you smoke over a period of time.  Going to in-person counseling. You are more likely to quit if you go to many counseling sessions.  Using resources and support systems, such as: ? Online chats with a counselor. ? Phone quitlines. ? Printed self-help materials. ? Support groups or group counseling. ? Text messaging programs. ? Mobile phone apps or applications.  Taking medicines. Some of these medicines may have nicotine in them. If you are pregnant or breastfeeding, do not take any medicines to quit smoking unless your doctor says it is okay. Talk with your doctor about counseling or other things that can help you.  Talk with your doctor about using more than one strategy at the same time, such as taking medicines while you are also going to in-person counseling. This can help make  quitting easier. What things can I do to make it easier to quit? Quitting smoking might feel very hard at first, but there is a lot that you can do to make it easier. Take these steps:  Talk to your family and friends. Ask them to support and encourage you.  Call phone quitlines, reach out to support groups, or work with a counselor.  Ask people who smoke to not smoke around you.  Avoid places that make you want (trigger) to smoke, such as: ? Bars. ? Parties. ? Smoke-break areas at work.  Spend time with people who do not smoke.  Lower the stress in your life. Stress can make you want to smoke. Try these things to help your stress: ? Getting regular exercise. ? Deep-breathing exercises. ? Yoga. ? Meditating. ? Doing a body scan. To do this, close your eyes, focus on one area of your body at a time from head to toe, and notice which parts of your body are tense. Try to relax the muscles in those areas.  Download or buy apps on your mobile phone or tablet that can help you stick to your quit plan. There are many free apps, such as QuitGuide from the CDC (Centers for Disease Control and Prevention). You can find more support from smokefree.gov and other websites.  This information is not intended to replace advice given to you by your health care provider. Make sure you discuss any questions you have with your health care provider. Document Released: 08/23/2009 Document   Revised: 06/24/2016 Document Reviewed: 03/13/2015 Elsevier Interactive Patient Education  2018 Elsevier Inc.  

## 2017-06-30 NOTE — Progress Notes (Signed)
CC:  I have pain of my right knee. I would like an injection.  The patient has chronic pain of the right knee.  There is no recent trauma.  There is no redness.  Injections in the past have helped.  The knee has no redness, has an effusion and crepitus present.  ROM of the right knee is 0-105.  Impression:  Chronic knee pain right  Return: as needed.  PROCEDURE NOTE:  The patient requests injections of the right knee , verbal consent was obtained.  The right knee was prepped appropriately after time out was performed.   Sterile technique was observed and injection of 1 cc of Depo-Medrol 40 mg with several cc's of plain xylocaine. Anesthesia was provided by ethyl chloride and a 20-gauge needle was used to inject the knee area. The injection was tolerated well.  A band aid dressing was applied.  The patient was advised to apply ice later today and tomorrow to the injection sight as needed.  Electronically Signed Sanjuana Kava, MD 8/21/20183:09 PM

## 2017-08-04 ENCOUNTER — Other Ambulatory Visit: Payer: Self-pay | Admitting: Family Medicine

## 2017-08-04 DIAGNOSIS — E119 Type 2 diabetes mellitus without complications: Secondary | ICD-10-CM

## 2017-12-01 ENCOUNTER — Other Ambulatory Visit: Payer: Self-pay

## 2017-12-01 ENCOUNTER — Ambulatory Visit (INDEPENDENT_AMBULATORY_CARE_PROVIDER_SITE_OTHER): Payer: BLUE CROSS/BLUE SHIELD | Admitting: Family Medicine

## 2017-12-01 ENCOUNTER — Encounter: Payer: Self-pay | Admitting: Family Medicine

## 2017-12-01 VITALS — BP 132/62 | HR 72 | Temp 98.9°F | Resp 16 | Ht 61.0 in | Wt 172.0 lb

## 2017-12-01 DIAGNOSIS — E119 Type 2 diabetes mellitus without complications: Secondary | ICD-10-CM

## 2017-12-01 DIAGNOSIS — K529 Noninfective gastroenteritis and colitis, unspecified: Secondary | ICD-10-CM | POA: Diagnosis not present

## 2017-12-01 LAB — URINALYSIS, ROUTINE W REFLEX MICROSCOPIC
Bilirubin Urine: NEGATIVE
Glucose, UA: NEGATIVE
Hgb urine dipstick: NEGATIVE
Leukocytes, UA: NEGATIVE
Nitrite: NEGATIVE
RBC / HPF: NONE SEEN /HPF (ref 0–2)
Specific Gravity, Urine: 1.026 (ref 1.001–1.03)
pH: 5.5 (ref 5.0–8.0)

## 2017-12-01 LAB — COMPREHENSIVE METABOLIC PANEL
AG Ratio: 1.4 (calc) (ref 1.0–2.5)
ALT: 10 U/L (ref 6–29)
AST: 9 U/L — ABNORMAL LOW (ref 10–35)
Albumin: 4 g/dL (ref 3.6–5.1)
Alkaline phosphatase (APISO): 74 U/L (ref 33–130)
BUN: 9 mg/dL (ref 7–25)
CO2: 26 mmol/L (ref 20–32)
Calcium: 9.7 mg/dL (ref 8.6–10.4)
Chloride: 104 mmol/L (ref 98–110)
Creat: 0.79 mg/dL (ref 0.50–1.05)
Globulin: 2.9 g/dL (calc) (ref 1.9–3.7)
Glucose, Bld: 114 mg/dL — ABNORMAL HIGH (ref 65–99)
Potassium: 4.2 mmol/L (ref 3.5–5.3)
Sodium: 139 mmol/L (ref 135–146)
Total Bilirubin: 0.4 mg/dL (ref 0.2–1.2)
Total Protein: 6.9 g/dL (ref 6.1–8.1)

## 2017-12-01 LAB — CBC WITH DIFFERENTIAL/PLATELET
Basophils Absolute: 42 cells/uL (ref 0–200)
Basophils Relative: 0.5 %
Eosinophils Absolute: 101 cells/uL (ref 15–500)
Eosinophils Relative: 1.2 %
HCT: 37.9 % (ref 35.0–45.0)
Hemoglobin: 12.6 g/dL (ref 11.7–15.5)
Lymphs Abs: 3150 cells/uL (ref 850–3900)
MCH: 26.4 pg — ABNORMAL LOW (ref 27.0–33.0)
MCHC: 33.2 g/dL (ref 32.0–36.0)
MCV: 79.3 fL — ABNORMAL LOW (ref 80.0–100.0)
MPV: 10.2 fL (ref 7.5–12.5)
Monocytes Relative: 4.9 %
Neutro Abs: 4696 cells/uL (ref 1500–7800)
Neutrophils Relative %: 55.9 %
Platelets: 374 10*3/uL (ref 140–400)
RBC: 4.78 10*6/uL (ref 3.80–5.10)
RDW: 15.7 % — ABNORMAL HIGH (ref 11.0–15.0)
Total Lymphocyte: 37.5 %
WBC mixed population: 412 cells/uL (ref 200–950)
WBC: 8.4 10*3/uL (ref 3.8–10.8)

## 2017-12-01 LAB — MICROSCOPIC MESSAGE

## 2017-12-01 LAB — HEMOGLOBIN A1C, FINGERSTICK: Hgb A1C (fingerstick): 6.6 % OF TOTAL HGB — ABNORMAL HIGH (ref <6.0)

## 2017-12-01 MED ORDER — METFORMIN HCL 500 MG PO TABS: 500.0000 mg | ORAL_TABLET | Freq: Two times a day (BID) | ORAL | 6 refills | Status: DC

## 2017-12-01 NOTE — Assessment & Plan Note (Signed)
Diabetes controlled, continue metformin once GI illness resolves

## 2017-12-01 NOTE — Patient Instructions (Addendum)
F/U 4 months for PHYSICAL Call Dr. Laural Golden for your colonoscopy [phone] Take Imodium for diarrhea, keep hydrated  Your A1C is  6.6% Stop Metformin for 3 days, then restart

## 2017-12-01 NOTE — Progress Notes (Signed)
   Subjective:    Patient ID: Danielle Hicks, female    DOB: 1956/05/31, 62 y.o.   MRN: [id number]  Patient presents for Generalized Pain (states that she has body aches all over); Lower ABD Pain; and R Sided Flank Pain (left urine sample)   Pain with diarrhea  in right lower    quadrant  started a a few days ago. SHad right flank pain before she had diarrhea, thinks she may have strained something at work, but that is better. Still has some diarrhea. Had 3 episodes yesterday and once today. Feels achy all over and joint pains, no fever.   No vomiting, had nausea. Only ate Soup.  +sick contacts at work, had had GI bug at work    DM- uncontrolled, restarted on metformin in [place] A1C was 11.8 then down to 9.6 in July 2018, she has not followed up since then. States she was feeling good  Check CBG recently was 130 Has only been taking metfrmin once a day for past couple of weeks, to stretch medications    HTN- taking norvasc and lisinopril HCTZ      Review Of Systems:  GEN- denies fatigue, fever, weight loss,weakness, recent illness HEENT- denies eye drainage, change in vision, nasal discharge, CVS- denies chest pain, palpitations RESP- denies SOB, cough, wheeze ABD- + N/ denies V, +change in stools, +abd pain GU- denies dysuria, hematuria, dribbling, incontinence MSK- denies joint pain, muscle aches, injury Neuro- denies headache, dizziness, syncope, seizure activity       Objective:    BP 132/62   Pulse 72   Temp 98.9 F (37.2 C) (Oral)   Resp 16   Ht 5\' 1"  (1.549 m)   Wt 172 lb (78 kg)   SpO2 98%   BMI 32.50 kg/m  GEN- NAD, alert and oriented x3 HEENT- PERRL, EOMI, non injected sclera, pink conjunctiva, MMM, oropharynx clear Neck- Supple, no LAD CVS- RRR, no murmur RESP-CTAB ABD-NABS,soft,, ND, Mild TTP LLQ, no rebound, no guarding, no CVA tenderness EXT- No edema Pulses- Radial, DP- 2+   UA- trace ketones and protein, no glucose     Assessment & Plan:       Problem List Items Addressed This Visit      High   Controlled type 2 diabetes mellitus without complication, without long-term current use of insulin (HCC) - Primary    Diabetes controlled, continue metformin once GI illness resolves      Relevant Medications   metFORMIN (GLUCOPHAGE) 500 MG tablet   Other Relevant Orders   Hemoglobin A1C, fingerstick (Completed)    Other Visit Diagnoses    Gastroenteritis       treat as viral illness, obtain labs , UA mild dehyration push fluids, hold MTF for 3 days, then resume. Can use immodium for diarrhea. Call if she does not imrp   Relevant Orders   Urinalysis, Routine w reflex microscopic (Completed)   CBC with Differential/Platelet   Comprehensive metabolic panel      Note: This dictation was prepared with Dragon dictation along with smaller phrase technology. Any transcriptional errors that result from this process are unintentional.

## 2017-12-03 ENCOUNTER — Encounter: Payer: Self-pay | Admitting: *Deleted

## 2017-12-09 ENCOUNTER — Ambulatory Visit (INDEPENDENT_AMBULATORY_CARE_PROVIDER_SITE_OTHER): Payer: BLUE CROSS/BLUE SHIELD | Admitting: Internal Medicine

## 2017-12-10 ENCOUNTER — Other Ambulatory Visit: Payer: Self-pay | Admitting: *Deleted

## 2017-12-10 MED ORDER — PANTOPRAZOLE SODIUM 40 MG PO TBEC: 40.0000 mg | DELAYED_RELEASE_TABLET | Freq: Every day | ORAL | 3 refills | Status: DC

## 2017-12-16 ENCOUNTER — Encounter: Payer: Self-pay | Admitting: Orthopedic Surgery

## 2017-12-16 ENCOUNTER — Ambulatory Visit (INDEPENDENT_AMBULATORY_CARE_PROVIDER_SITE_OTHER): Payer: BLUE CROSS/BLUE SHIELD

## 2017-12-16 ENCOUNTER — Encounter: Payer: Self-pay | Admitting: Orthopaedic Surgery

## 2017-12-16 ENCOUNTER — Ambulatory Visit (INDEPENDENT_AMBULATORY_CARE_PROVIDER_SITE_OTHER): Payer: BLUE CROSS/BLUE SHIELD | Admitting: Orthopaedic Surgery

## 2017-12-16 VITALS — BP 138/89 | HR 65 | Ht 61.0 in | Wt 173.0 lb

## 2017-12-16 DIAGNOSIS — M25562 Pain in left knee: Secondary | ICD-10-CM

## 2017-12-16 DIAGNOSIS — G8929 Other chronic pain: Secondary | ICD-10-CM

## 2017-12-16 MED ORDER — HYDROCODONE-ACETAMINOPHEN 5-325 MG PO TABS: ORAL_TABLET | ORAL | 0 refills | Status: DC

## 2017-12-16 NOTE — Patient Instructions (Signed)

## 2017-12-16 NOTE — Progress Notes (Signed)
Patient Danielle Hicks, female DOB:1955-12-03, 62 y.o. SAY:[id number]  Chief Complaint  Patient presents with  . NEW PROBLEM    LEFT KNEE    HPI  Danielle Hicks is a 62 y.o. female who has had right knee pain in the past.  She has severe medial degeneration of the right knee.  MRI was done last year in February.  She has been considering a total knee on the right.  The left knee started hurting more and more over the last month.  She has swelling, popping, medial pain but no giving way.  She has been limping.  She has no trauma, no falls. HPI  Body mass index is 32.69 kg/m.  ROS  Review of Systems  Respiratory: Negative for shortness of breath.   Cardiovascular: Negative for chest pain.  Musculoskeletal: Positive for arthralgias, gait problem and joint swelling.  All other systems reviewed and are negative.   Past Medical History:  Diagnosis Date  . GERD (gastroesophageal reflux disease)   . Hypertension   . Type 2 diabetes mellitus ([place])     Past Surgical History:  Procedure Laterality Date  . ABDOMINAL HYSTERECTOMY      Family History  Problem Relation Age of Onset  . Diabetes Mother   . Hypertension Mother   . Stroke Sister     Social History Social History   Tobacco Use  . Smoking status: Current Every Day Smoker    Types: Cigarettes  . Smokeless tobacco: Never Used  . Tobacco comment: says she has "couple a day"   Substance Use Topics  . Alcohol use: No    Alcohol/week: 0.0 oz  . Drug use: No    Allergies  Allergen Reactions  . Penicillins     Gave her a rash as a child     Current Outpatient Medications  Medication Sig Dispense Refill  . amLODipine (NORVASC) 10 MG tablet take 1 tablet by mouth once daily 30 tablet 6  . HYDROcodone-acetaminophen (NORCO/VICODIN) 5-325 MG tablet One tablet by mouth every six hours as needed for pain.  Seven day limit 28 tablet 0  . lisinopril-hydrochlorothiazide (PRINZIDE,ZESTORETIC) 10-12.5 MG tablet  take 1 tablet by mouth once daily 30 tablet 6  . metFORMIN (GLUCOPHAGE) 500 MG tablet Take 1 tablet (500 mg total) by mouth 2 (two) times daily with a meal. 60 tablet 6  . Multiple Vitamins-Calcium (ONE-A-DAY WOMENS FORMULA PO) Take by mouth.    . pantoprazole (PROTONIX) 40 MG tablet Take 1 tablet (40 mg total) by mouth daily. 90 tablet 3   No current facility-administered medications for this visit.      Physical Exam  Blood pressure 138/89, pulse 65, height 5\' 1"  (1.549 m), weight 173 lb (78.5 kg).  Constitutional: overall normal hygiene, normal nutrition, well developed, normal grooming, normal body habitus. Assistive device:none  Musculoskeletal: gait and station Limp left, muscle tone and strength are normal, no tremors or atrophy is present.  .  Neurological: coordination overall normal.  Deep tendon reflex/nerve stretch intact.  Sensation normal.  Cranial nerves II-XII intact.   Skin:   Normal overall no scars, lesions, ulcers or rashes. No psoriasis.  Psychiatric: Alert and oriented x 3.  Recent memory intact, remote memory unclear.  Normal mood and affect. Well groomed.  Good eye contact.  Cardiovascular: overall no swelling, no varicosities, no edema bilaterally, normal temperatures of the legs and arms, no clubbing, cyanosis and good capillary refill.  Lymphatic: palpation is normal.  The left lower extremity  is examined:  Inspection:  Thigh:  Non-tender and no defects  Knee has swelling 1+ effusion.                        Joint tenderness is present                        Patient is tender over the medial joint line  Lower Leg:  Has normal appearance and no tenderness or defects  Ankle:  Non-tender and no defects  Foot:  Non-tender and no defects Range of Motion:  Knee:  Range of motion is: 0-100                        Crepitus is  present  Ankle:  Range of motion is normal. Strength and Tone:  The left lower extremity has normal strength and  tone. Stability:  Knee:  The knee is stable.  Ankle:  The ankle is stable.   All other systems reviewed and are negative   The patient has been educated about the nature of the problem(s) and counseled on treatment options.  The patient appeared to understand what I have discussed and is in agreement with it.  Encounter Diagnosis  Name Primary?  . Chronic pain of left knee Yes   PROCEDURE NOTE:  The patient requests injections of the left knee , verbal consent was obtained.  The left knee was prepped appropriately after time out was performed.   Sterile technique was observed and injection of 1 cc of Depo-Medrol 40 mg with several cc's of plain xylocaine. Anesthesia was provided by ethyl chloride and a 20-gauge needle was used to inject the knee area. The injection was tolerated well.  A band aid dressing was applied.  The patient was advised to apply ice later today and tomorrow to the injection sight as needed.   PLAN Call if any problems.  Precautions discussed.  Continue current medications.   Return to clinic to see Dr. Aline Brochure for evaluation for total knee right.   I have reviewed the [facility] web site prior to prescribing narcotic medicine for this patient.  Electronically Signed Sanjuana Kava, MD 2/6/20198:49 AM

## 2017-12-29 ENCOUNTER — Encounter: Payer: Self-pay | Admitting: Orthopedic Surgery

## 2017-12-29 ENCOUNTER — Telehealth: Payer: Self-pay | Admitting: Family Medicine

## 2017-12-29 NOTE — Telephone Encounter (Signed)
Call placed to patient Danielle Hicks patient is not due for colonoscopy until 04/2018

## 2017-12-29 NOTE — Telephone Encounter (Signed)
Wants referral for gastroenterology for colonoscopy. Wants a dr in [employer].

## 2017-12-30 ENCOUNTER — Encounter: Payer: Self-pay | Admitting: Orthopedic Surgery

## 2017-12-30 ENCOUNTER — Ambulatory Visit (INDEPENDENT_AMBULATORY_CARE_PROVIDER_SITE_OTHER): Payer: BLUE CROSS/BLUE SHIELD | Admitting: Orthopedic Surgery

## 2017-12-30 VITALS — BP 112/77 | HR 61 | Ht 61.0 in | Wt 174.0 lb

## 2017-12-30 DIAGNOSIS — M1712 Unilateral primary osteoarthritis, left knee: Secondary | ICD-10-CM

## 2017-12-30 DIAGNOSIS — F17210 Nicotine dependence, cigarettes, uncomplicated: Secondary | ICD-10-CM | POA: Diagnosis not present

## 2017-12-30 NOTE — Telephone Encounter (Signed)
She already sees Dr. Laural Golden. She can call and see when they want to schedule her. Per there last note not due yet

## 2017-12-30 NOTE — Telephone Encounter (Signed)
Call placed to patient and patient made aware.  

## 2017-12-30 NOTE — Patient Instructions (Signed)
You have decided to proceed with knee replacement surgery. You have decided not to continue with nonoperative measures such as but not limited to oral medication, weight loss, activity modification, physical therapy, bracing, or injection.  We will perform the procedure commonly known as total knee replacement. Some of the risks associated with knee replacement surgery include but are not limited to Bleeding Infection Swelling Stiffness Blood clot Pain that persists even after surgery  Infection is especially devastating complication of knee surgery although rare. If infection does occur your implant will usually have to be removed and several surgeries and antibiotics will be needed to eradicate the infection prior to performing a repeat replacement.   In some cases amputation is required to eradicate the infection. In other rare cases a knee fusion is needed    If you're not comfortable with these risks and would like to continue with nonoperative treatment please let Dr. Jhan Conery know prior to your surgery.  

## 2017-12-30 NOTE — Progress Notes (Signed)
Danielle Hicks is a 62 y.o. female   HPI:  Knee Pain: left knee evaluation   62 yo female presents for evaluation of left knee for possible surgery  She complains of dull aching medial left knee pain now for several months associated with swelling decreased range of motion trouble getting in a chair  Family History  Problem Relation Age of Onset  . Diabetes Mother   . Hypertension Mother   . Stroke Sister    Social History   Tobacco Use  . Smoking status: Current Some Day Smoker    Types: Cigarettes  . Smokeless tobacco: Never Used  . Tobacco comment: trying to quit   Substance Use Topics  . Alcohol use: No    Alcohol/week: 0.0 oz  . Drug use: No     Past Medical History:  Diagnosis Date  . GERD (gastroesophageal reflux disease)   . Hypertension   . Type 2 diabetes mellitus ([place])    Past Surgical History:  Procedure Laterality Date  . ABDOMINAL HYSTERECTOMY      Current Outpatient Medications:  .  amLODipine (NORVASC) 10 MG tablet, take 1 tablet by mouth once daily, Disp: 30 tablet, Rfl: 6 .  HYDROcodone-acetaminophen (NORCO/VICODIN) 5-325 MG tablet, One tablet by mouth every six hours as needed for pain.  Seven day limit, Disp: 28 tablet, Rfl: 0 .  lisinopril-hydrochlorothiazide (PRINZIDE,ZESTORETIC) 10-12.5 MG tablet, take 1 tablet by mouth once daily, Disp: 30 tablet, Rfl: 6 .  metFORMIN (GLUCOPHAGE) 500 MG tablet, Take 1 tablet (500 mg total) by mouth 2 (two) times daily with a meal., Disp: 60 tablet, Rfl: 6 .  Multiple Vitamins-Calcium (ONE-A-DAY WOMENS FORMULA PO), Take by mouth., Disp: , Rfl:  .  pantoprazole (PROTONIX) 40 MG tablet, Take 1 tablet (40 mg total) by mouth daily., Disp: 90 tablet, Rfl: 3 Allergies  Allergen Reactions  . Penicillins     Gave her a rash as a child     reports that she has been smoking cigarettes.  she has never used smokeless tobacco. She reports that she does not drink alcohol or use drugs. Family History  Problem Relation  Age of Onset  . Diabetes Mother   . Hypertension Mother   . Stroke Sister    Physical Exam  Constitutional: She is oriented to person, place, and time. She appears well-developed and well-nourished.  Neurological: She is alert and oriented to person, place, and time.  Psychiatric: She has a normal mood and affect. Judgment normal.  Vitals reviewed.  Knee:left Varus aligned knee:  with no erythema or effusion or obvious bony abnormalities. Palpation abrmal with no warmth but severe joint line tenderness  ROM 115 in flexion and 5 extension  Ligaments with solid consistent endpoints including ACL, PCL, LCL, MCL. Negative Mcmurray's and provocative meniscal tests. Mild painful patellar compression. Patellar and quadriceps tendons unremarkable. Hamstring and quadriceps strength is normal. Neuro vascular exam intact   Right knee looks similar large bursal swelling as noted on the left  Varus alignment.  115 degrees of flexion 5 extension ligament solid with firm endpoints mild patella femoral compression pain patellar and quadriceps tendon normal hamstring and quadricep strength normal neurovascular exam is intact   The x-ray done on February 6 shows varus alignment to the knee complete obliteration of the medial joint space no major osteophytes minimal subchondral bone sclerosis  Plan is to do a left total knee arthroplasty  The patient's job requires a lot of climbing kneeling squatting and bending and  she is aware that she return to the same job duties.  She understands her risks for complications are increased by history of smoking and diabetes  The procedure has been fully reviewed with the patient; The risks and benefits of surgery have been discussed and explained and understood. Alternative treatment has also been reviewed, questions were encouraged and answered. The postoperative plan is also been reviewed.

## 2018-01-05 ENCOUNTER — Telehealth: Payer: Self-pay | Admitting: Orthopaedic Surgery

## 2018-01-05 NOTE — Telephone Encounter (Signed)
Patient called to ask about appointment following her visit at [facility] Emergency room last night, 01/04/18, for new problem - left shoulder pain/"bursitis"; states no Xrays were done, and that she was placed in a sling.  Advised that records/reports are needed prior to scheduling.  States she will work on obtaining records, as "needs to be seen today or tomorrow, due to being written out of work."  Appointment pending.

## 2018-01-06 ENCOUNTER — Encounter: Payer: Self-pay | Admitting: *Deleted

## 2018-01-06 ENCOUNTER — Ambulatory Visit (HOSPITAL_COMMUNITY)
Admission: RE | Admit: 2018-01-06 | Discharge: 2018-01-06 | Disposition: A | Discharge status: Home / Self Care | Payer: BLUE CROSS/BLUE SHIELD | Source: Ambulatory Visit | Attending: Family Medicine | Admitting: Family Medicine

## 2018-01-06 ENCOUNTER — Encounter: Payer: Self-pay | Admitting: Family Medicine

## 2018-01-06 ENCOUNTER — Other Ambulatory Visit: Payer: Self-pay

## 2018-01-06 ENCOUNTER — Ambulatory Visit (INDEPENDENT_AMBULATORY_CARE_PROVIDER_SITE_OTHER): Payer: BLUE CROSS/BLUE SHIELD | Admitting: Family Medicine

## 2018-01-06 VITALS — BP 120/72 | HR 70 | Temp 98.9°F | Resp 16 | Ht 61.0 in | Wt 174.0 lb

## 2018-01-06 DIAGNOSIS — M7582 Other shoulder lesions, left shoulder: Secondary | ICD-10-CM

## 2018-01-06 DIAGNOSIS — R202 Paresthesia of skin: Secondary | ICD-10-CM

## 2018-01-06 DIAGNOSIS — M5030 Other cervical disc degeneration, unspecified cervical region: Secondary | ICD-10-CM | POA: Diagnosis not present

## 2018-01-06 DIAGNOSIS — M19012 Primary osteoarthritis, left shoulder: Secondary | ICD-10-CM | POA: Diagnosis not present

## 2018-01-06 DIAGNOSIS — M50322 Other cervical disc degeneration at C5-C6 level: Secondary | ICD-10-CM | POA: Diagnosis not present

## 2018-01-06 MED ORDER — METHYLPREDNISOLONE 4 MG PO TBPK: ORAL_TABLET | ORAL | 0 refills | Status: DC

## 2018-01-06 NOTE — Telephone Encounter (Signed)
Called back to patient, as have had no response as to status of her records/reports/film, in order to schedule as soon as possible.  Left voice messages at both #'s.

## 2018-01-06 NOTE — Telephone Encounter (Signed)
Patient called back and relayed that she saw her primary care doctor yesterday, Dr Buelah Manis, and she treated her shoulder problem. Latricia Heft ordered at [facility] for her, and that she may be referred back by Dr Buelah Manis to our office if needed. Patient aware of other pending appointment and surgery for knee.

## 2018-01-06 NOTE — Progress Notes (Signed)
   Subjective:    Patient ID: Danielle Hicks, female    DOB: October 24, 1956, 62 y.o.   MRN: [id number]  Patient presents for ER F/U (rotator cuff tendonitis)   Pt her for ER follow up. Does some repetitive motions at work. Left shoulder pain for past week or so, then began to have tingling and numbness into fingertips that worsened. When she moves arm a certain way then tingling goes throughout arm Seen at [facility] given Toradol 10mg . Told to f/u with PCP./ Sling  Note has surgery scheduled for Right knee next month   Does get some neck discomfort , has not dropped any items She did dislocate arm > 20 years ago   DM- last A1C in Jan 6.6, but CBG have been running up , last CBG 117, Average 130 over past 30 days    Review Of Systems:  GEN- denies fatigue, fever, weight loss,weakness, recent illness HEENT- denies eye drainage, change in vision, nasal discharge, CVS- denies chest pain, palpitations RESP- denies SOB, cough, wheeze ABD- denies N/V, change in stools, abd pain GU- denies dysuria, hematuria, dribbling, incontinence MSK- + joint pain, muscle aches, injury Neuro- denies headache, dizziness, syncope, seizure activity       Objective:    BP 120/72   Pulse 70   Temp 98.9 F (37.2 C) (Oral)   Resp 16   Ht 5\' 1"  (1.549 m)   Wt 174 lb (78.9 kg)   SpO2 98%   BMI 32.88 kg/m  GEN- NAD, alert and oriented x3 HEENT- PERRL, EOMI, non injected sclera, pink conjunctiva, MMM, oropharynx clear Neck- Supple, good ROM, neg spurlings  CVS- RRR, no murmur RESP-CTAB MSK- fair ROM LUE, compared to right, +empty can, + neers ?, biceps in tact          Assessment & Plan:      Problem List Items Addressed This Visit      Unprioritized   DDD (degenerative disc disease), cervical    Xray obtain of shoulder showing some AC joint arthritis And Neck as likley nerve symptoms coming from neck Xray shows some DDD and mild foraminal narrowing but this is on right side not left  so does not quite add up with symptoms   D/C toradol, start medrol dosepak to helo with inflammation  She is still wearing sling feels comfortable with this, also use heat/ice  Referral to her orthopedics to evaluate as well   FMLA out of work for 3 days       Relevant Medications   ketorolac (TORADOL) 10 MG tablet   methylPREDNISolone (MEDROL DOSEPAK) 4 MG TBPK tablet    Other Visit Diagnoses    Paresthesia of left upper extremity    -  Primary   Relevant Orders   Ambulatory referral to Orthopedic Surgery   DG Cervical Spine Complete (Completed)   DG Shoulder Left (Completed)   Rotator cuff tendinitis, left       Relevant Medications   ketorolac (TORADOL) 10 MG tablet   methylPREDNISolone (MEDROL DOSEPAK) 4 MG TBPK tablet   Other Relevant Orders   Ambulatory referral to Orthopedic Surgery   DG Cervical Spine Complete (Completed)   DG Shoulder Left (Completed)      Note: This dictation was prepared with Dragon dictation along with smaller phrase technology. Any transcriptional errors that result from this process are unintentional.

## 2018-01-06 NOTE — Patient Instructions (Addendum)
Take the medrol dose pak Referral to orthopedics Note for work Stop the toradol F/U as previous

## 2018-01-06 NOTE — Assessment & Plan Note (Signed)
Xray obtain of shoulder showing some AC joint arthritis And Neck as likley nerve symptoms coming from neck Xray shows some DDD and mild foraminal narrowing but this is on right side not left so does not quite add up with symptoms   D/C toradol, start medrol dosepak to helo with inflammation  She is still wearing sling feels comfortable with this, also use heat/ice  Referral to her orthopedics to evaluate as well   FMLA out of work for 3 days

## 2018-01-07 ENCOUNTER — Telehealth: Payer: Self-pay | Admitting: Radiology

## 2018-01-07 NOTE — Telephone Encounter (Signed)
Called [facility] [place] for auth of [place] [address] total knee replacement Case number is [id number] spoke to Collie Siad A this is approved    [employer] for approval Fonda Kinder) also spoke to [place] ref #[id number] since they are not primary insurance coverage there is not a need for pre certification

## 2018-01-14 ENCOUNTER — Other Ambulatory Visit: Payer: Self-pay | Admitting: Orthopedic Surgery

## 2018-01-14 DIAGNOSIS — M1712 Unilateral primary osteoarthritis, left knee: Secondary | ICD-10-CM

## 2018-01-15 ENCOUNTER — Telehealth: Payer: Self-pay | Admitting: *Deleted

## 2018-01-15 NOTE — Telephone Encounter (Signed)
Received fax from [facility] for STD forms.   Patient seen in our office on 01/06/2018 for L shoulder pain as F/U from ER at [facility]. MD gave #3 days of FMLA.   Verbalized that fee may be charged and is per provider prerogative.   Forms routed to provider.

## 2018-01-18 NOTE — Patient Instructions (Signed)
Danielle Hicks  01/18/2018     @PREFPERIOPPHARMACY @   Your procedure is scheduled on 01/26/2018.  Report to [facility] at 8:25 A.M.  Call this number if you have problems the morning of surgery:  [phone]   Remember:  Do not eat food or drink liquids after midnight.  Take these medicines the morning of surgery with A SIP OF WATER Protonix, Lisinopril, Hydrocodone if needed, Amlodipine   Do not wear jewelry, make-up or nail polish.  Do not wear lotions, powders, or perfumes, or deodorant.  Do not shave 48 hours prior to surgery.  Men may shave face and neck.  Do not bring valuables to the hospital.  [facility] is not responsible for any belongings or valuables.  Contacts, dentures or bridgework may not be worn into surgery.  Leave your suitcase in the car.  After surgery it may be brought to your room.  For patients admitted to the hospital, discharge time will be determined by your treatment team.  Patients discharged the day of surgery will not be allowed to drive home.    Please read over the following fact sheets that you were given. Total Joint Packet, Surgical Site Infection Prevention and Anesthesia Post-op Instructions     PATIENT INSTRUCTIONS POST-ANESTHESIA  IMMEDIATELY FOLLOWING SURGERY:  Do not drive or operate machinery for the first twenty four hours after surgery.  Do not make any important decisions for twenty four hours after surgery or while taking narcotic pain medications or sedatives.  If you develop intractable nausea and vomiting or a severe headache please notify your doctor immediately.  FOLLOW-UP:  Please make an appointment with your surgeon as instructed. You do not need to follow up with anesthesia unless specifically instructed to do so.  WOUND CARE INSTRUCTIONS (if applicable):  Keep a dry clean dressing on the anesthesia/puncture wound site if there is drainage.  Once the wound has quit draining you may leave it open to air.  Generally  you should leave the bandage intact for twenty four hours unless there is drainage.  If the epidural site drains for more than 36-48 hours please call the anesthesia department.  QUESTIONS?:  Please feel free to call your physician or the hospital operator if you have any questions, and they will be happy to assist you.      Total Knee Replacement Total knee replacement is a procedure to replace the knee joint with an artificial (prosthetic) knee joint. The purpose of this surgery is to reduce knee pain and improve knee function. The prosthetic knee joint (prosthesis) may be made of metal, plastic or ceramic. It replaces parts of the thigh bone (femur), lower leg bone (tibia), and kneecap (patella) that are removed during the procedure. Tell a health care provider about:  Any allergies you have.  All medicines you are taking, including vitamins, herbs, eye drops, creams, and over-the-counter medicines.  Any problems you or family members have had with anesthetic medicines.  Any blood disorders you have.  Any surgeries you have had.  Any medical conditions you have.  Whether you are pregnant or may be pregnant. What are the risks? Generally, this is a safe procedure. However, problems may occur, including:  Infection.  Bleeding.  Allergic reactions to medicines.  Damage to other structures or organs.  Decreased range of motion of the knee.  Instability of the knee.  Loosening of the prosthetic joint.  Knee pain that does not go away (chronic pain).  What happens before  the procedure?  Ask your health care provider about: ? Changing or stopping your regular medicines. This is especially important if you are taking diabetes medicines or blood thinners. ? Taking medicines such as aspirin and ibuprofen. These medicines can thin your blood. Do not take these medicines before your procedure if your health care provider instructs you not to.  Have dental care and routine  cleanings completed before your procedure. Plan to not have dental work done for 3 months after your procedure. Germs from anywhere in your body, including your mouth, can travel to your new joint and infect it.  Follow instructions from your health care provider about eating or drinking restrictions.  Ask your health care provider how your surgical site will be marked or identified.  You may be given antibiotic medicine to help prevent infection.  If your health care provider prescribes physical therapy, do exercises as instructed.  Do not use any tobacco products, such as cigarettes, chewing tobacco, or e-cigarettes. If you need help quitting, ask your health care provider.  You may have a physical exam.  You may have tests, such as: ? X-rays. ? MRI. ? CT scan. ? Bone scans.  You may have a blood or urine sample taken.  Plan to have someone take you home after the procedure.  If you will be going home right after the procedure, plan to have someone with you for at least 24 hours. It is recommended that you have someone to help care for you for at least 4-6 weeks after your procedure. What happens during the procedure?  To reduce your risk of infection: ? Your health care team will wash or sanitize their hands. ? Your skin will be washed with soap.  An IV tube will be inserted into one of your veins.  You will be given one or more of the following: ? A medicine to help you relax (sedative). ? A medicine to numb the area (local anesthetic). ? A medicine to make you fall asleep (general anesthetic). ? A medicine that is injected into your spine to numb the area below and slightly above the injection site (spinal anesthetic). ? A medicine that is injected into an area of your body to numb everything below the injection site (regional anesthetic).  An incision will be made in your knee.  Damaged cartilage and bone will be removed from your femur, tibia, and patella.  Parts of  the prosthesis (liners) will be placed over the areas of bone and cartilage that were removed. A metal liner will be placed over your femur, and plastic liners will be placed over your tibia and the underside of your patella.  One or more small tubes (drains) may be placed near your incision to help drain extra fluid from your surgical site.  Your incision will be closed with stitches (sutures), skin glue, or adhesive strips. Medicine may be applied to your incision.  A bandage (dressing) will be placed over your incision. The procedure may vary among health care providers and hospitals. What happens after the procedure?  Your blood pressure, heart rate, breathing rate, and blood oxygen level will be monitored often until the medicines you were given have worn off.  You may continue to receive fluids and medicines through an IV tube.  You will have some pain. Pain medicines will be available to help you.  You may have fluid coming from one or more drains in your incision.  You may have to wear compression stockings. These  stockings help to prevent blood clots and reduce swelling in your legs.  You will be encouraged to move around as much as possible.  You may be given a continuous passive motion machine to use at home. You will be shown how to use this machine.  Do not drive for 24 hours if you received a sedative. This information is not intended to replace advice given to you by your health care provider. Make sure you discuss any questions you have with your health care provider. Document Released: 02/02/2001 Document Revised: 11/29/2016 Document Reviewed: 10/03/2015 Elsevier Interactive Patient Education  [employer].

## 2018-01-21 ENCOUNTER — Encounter (HOSPITAL_COMMUNITY)
Admission: RE | Admit: 2018-01-21 | Discharge: 2018-01-21 | Disposition: A | Discharge status: Home / Self Care | Payer: BLUE CROSS/BLUE SHIELD | Source: Ambulatory Visit | Attending: Orthopedic Surgery | Admitting: Orthopedic Surgery

## 2018-01-21 ENCOUNTER — Encounter (HOSPITAL_COMMUNITY): Payer: Self-pay

## 2018-01-21 ENCOUNTER — Other Ambulatory Visit: Payer: Self-pay

## 2018-01-21 DIAGNOSIS — R9431 Abnormal electrocardiogram [ECG] [EKG]: Secondary | ICD-10-CM | POA: Insufficient documentation

## 2018-01-21 DIAGNOSIS — Z01810 Encounter for preprocedural cardiovascular examination: Secondary | ICD-10-CM | POA: Insufficient documentation

## 2018-01-21 DIAGNOSIS — Z01812 Encounter for preprocedural laboratory examination: Secondary | ICD-10-CM | POA: Insufficient documentation

## 2018-01-21 HISTORY — DX: Headache, unspecified: R51.9

## 2018-01-21 HISTORY — DX: Unspecified osteoarthritis, unspecified site: M19.90

## 2018-01-21 HISTORY — DX: Headache: R51

## 2018-01-21 LAB — CBC WITH DIFFERENTIAL/PLATELET
Basophils Absolute: 0 10*3/uL (ref 0.0–0.1)
Basophils Relative: 0 %
Eosinophils Absolute: 0.2 10*3/uL (ref 0.0–0.7)
Eosinophils Relative: 2 %
HCT: 36.3 % (ref 36.0–46.0)
Hemoglobin: 11.7 g/dL — ABNORMAL LOW (ref 12.0–15.0)
Lymphocytes Relative: 36 %
Lymphs Abs: 3.2 10*3/uL (ref 0.7–4.0)
MCH: 26.2 pg (ref 26.0–34.0)
MCHC: 32.2 g/dL (ref 30.0–36.0)
MCV: 81.2 fL (ref 78.0–100.0)
Monocytes Absolute: 0.5 10*3/uL (ref 0.1–1.0)
Monocytes Relative: 6 %
Neutro Abs: 5.1 10*3/uL (ref 1.7–7.7)
Neutrophils Relative %: 56 %
Platelets: 325 10*3/uL (ref 150–400)
RBC: 4.47 MIL/uL (ref 3.87–5.11)
RDW: 17.2 % — ABNORMAL HIGH (ref 11.5–15.5)
WBC: 9 10*3/uL (ref 4.0–10.5)

## 2018-01-21 LAB — COMPREHENSIVE METABOLIC PANEL
ALT: 14 U/L (ref 14–54)
AST: 13 U/L — ABNORMAL LOW (ref 15–41)
Albumin: 3.7 g/dL (ref 3.5–5.0)
Alkaline Phosphatase: 67 U/L (ref 38–126)
Anion gap: 12 (ref 5–15)
BUN: 12 mg/dL (ref 6–20)
CO2: 25 mmol/L (ref 22–32)
Calcium: 9.4 mg/dL (ref 8.9–10.3)
Chloride: 101 mmol/L (ref 101–111)
Creatinine, Ser: 0.99 mg/dL (ref 0.44–1.00)
GFR calc Af Amer: >60 mL/min (ref >60)
GFR calc non Af Amer: >60 mL/min (ref >60)
Glucose, Bld: 127 mg/dL — ABNORMAL HIGH (ref 65–99)
Potassium: 3.4 mmol/L — ABNORMAL LOW (ref 3.5–5.1)
Sodium: 138 mmol/L (ref 135–145)
Total Bilirubin: 0.5 mg/dL (ref 0.3–1.2)
Total Protein: 7.1 g/dL (ref 6.5–8.1)

## 2018-01-21 LAB — PROTIME-INR
INR: 0.89
Prothrombin Time: 12 seconds (ref 11.4–15.2)

## 2018-01-21 LAB — SURGICAL PCR SCREEN
MRSA, PCR: NEGATIVE
Staphylococcus aureus: NEGATIVE

## 2018-01-21 LAB — HEMOGLOBIN A1C
Hgb A1c MFr Bld: 6.6 % — ABNORMAL HIGH (ref 4.8–5.6)
Mean Plasma Glucose: 142.72 mg/dL

## 2018-01-21 LAB — GLUCOSE, CAPILLARY: Glucose-Capillary: 115 mg/dL — ABNORMAL HIGH (ref 65–99)

## 2018-01-21 LAB — ABO/RH: ABO/RH(D): O POS

## 2018-01-21 LAB — PREPARE RBC (CROSSMATCH)

## 2018-01-21 NOTE — Pre-Procedure Instructions (Signed)
HgbA1C routed to PCP. 

## 2018-01-22 ENCOUNTER — Ambulatory Visit (INDEPENDENT_AMBULATORY_CARE_PROVIDER_SITE_OTHER): Payer: BLUE CROSS/BLUE SHIELD | Admitting: Orthopedic Surgery

## 2018-01-22 VITALS — BP 138/92 | HR 73 | Ht 61.0 in | Wt 168.0 lb

## 2018-01-22 DIAGNOSIS — M4722 Other spondylosis with radiculopathy, cervical region: Secondary | ICD-10-CM

## 2018-01-22 MED ORDER — POTASSIUM CHLORIDE CRYS ER 20 MEQ PO TBCR: 20.0000 meq | EXTENDED_RELEASE_TABLET | Freq: Three times a day (TID) | ORAL | 0 refills | Status: DC

## 2018-01-22 NOTE — Progress Notes (Signed)
Progress Note  NEW PROBLEM   Patient ID: Danielle Hicks, female   DOB: 25-Jan-1956, 62 y.o.   MRN: [id number]  Chief Complaint  Patient presents with  . Shoulder Pain    Left shoulder pain, referred by Dr. Buelah Manis, no injury, also seen in the ER.    62 year old female scheduled for total knee on Tuesday presented to the ER with neck and shoulder pain with numbness and tingling and dull aching gnawing symptoms for now 1-2 weeks  Associated with clicking popping decreased range of motion cervical spine some mild discomfort in the left shoulder with forward elevation this was relieved with a steroid Dosepak     Review of Systems  Constitutional: Negative for chills, fever and weight loss.  Respiratory: Negative for shortness of breath.   Cardiovascular: Negative for chest pain.  Neurological: Positive for tingling and sensory change. Negative for focal weakness.   No outpatient medications have been marked as taking for the 01/22/18 encounter (Office Visit) with Carole Civil, MD.    Past Medical History:  Diagnosis Date  . Arthritis   . GERD (gastroesophageal reflux disease)   . Headache   . Hypertension   . Type 2 diabetes mellitus (HCC)      Allergies  Allergen Reactions  . Penicillins Other (See Comments)    Gave her a rash as a child Has patient had a PCN reaction causing immediate rash, facial/tongue/throat swelling, SOB or lightheadedness with hypotension: Unknown Has patient had a PCN reaction causing severe rash involving mucus membranes or skin necrosis: Unknown Has patient had a PCN reaction that required hospitalization: Unknown Has patient had a PCN reaction occurring within the last 10 years: No If all of the above answers are "NO", then may proceed with Cephalosporin use.     BP (!) 138/92   Pulse 73   Ht 5\' 1"  (1.549 m)   Wt 168 lb (76.2 kg)   BMI 31.74 kg/m    Physical Exam  Constitutional: She is oriented to person, place, and time. She  appears well-developed and well-nourished.  Neck:    Neurological: She is alert and oriented to person, place, and time.  Psychiatric: She has a normal mood and affect. Judgment normal.  Vitals reviewed.   Right Shoulder Exam   Tenderness  The patient is experiencing no tenderness.  Range of Motion  The patient has normal right shoulder ROM.  Muscle Strength  The patient has normal right shoulder strength.  Tests  Apprehension: negative Sulcus: absent  Other  Erythema: absent Scars: absent Sensation: normal Pulse: present   Left Shoulder Exam  Left shoulder exam is normal.  Tenderness  The patient is experiencing no tenderness.   Range of Motion  The patient has normal left shoulder ROM.  Muscle Strength  The patient has normal left shoulder strength.  Tests  Apprehension: negative Impingement: negative Sulcus: absent  Other  Erythema: absent Scars: absent Sensation: normal Pulse: present      Neck x-ray shows disc disease shoulder x-rays show some AC joint arthritis glenohumeral joint normal  More specifically the neck film showed disc space narrowing at C3-4 C5-6 foraminal narrowing at C5-6 facet arthritis at C4  Encounter Diagnosis  Name Primary?  . Cervical spondylosis with radiculopathy Yes    Symptomatic treatment should not prevent her from having her knee replacement we will see her on Tuesday as scheduled     Arther Abbott, MD 01/22/2018 9:55 AM

## 2018-01-22 NOTE — Addendum Note (Signed)
Addended byCandice Camp on: 01/22/2018 10:07 AM   Modules accepted: Orders

## 2018-01-25 ENCOUNTER — Encounter: Payer: Self-pay | Admitting: Orthopedic Surgery

## 2018-01-25 MED ORDER — SODIUM CHLORIDE 0.9 % IV SOLN
1000.0000 mg | INTRAVENOUS | Status: AC
  Administered 2018-01-26: 1000 mg via INTRAVENOUS
  Filled 2018-01-25: qty 10

## 2018-01-25 NOTE — H&P (Signed)
Danielle Hicks is a 62 y.o. female    HPI:  Knee Pain: left knee evaluation    62 yo female presents for evaluation of left knee for possible surgery   She complains of dull aching medial left knee pain now for several months associated with swelling decreased range of motion trouble getting in a chair        Family History  Problem Relation Age of Onset  . Diabetes Mother    . Hypertension Mother    . Stroke Sister      Social History         Tobacco Use  . Smoking status: Current Some Day Smoker      Types: Cigarettes  . Smokeless tobacco: Never Used  . Tobacco comment: trying to quit   Substance Use Topics  . Alcohol use: No      Alcohol/week: 0.0 oz  . Drug use: No            Past Medical History:  Diagnosis Date  . GERD (gastroesophageal reflux disease)    . Hypertension    . Type 2 diabetes mellitus ([place])           Past Surgical History:  Procedure Laterality Date  . ABDOMINAL HYSTERECTOMY          Current Outpatient Medications:  .  amLODipine (NORVASC) 10 MG tablet, take 1 tablet by mouth once daily, Disp: 30 tablet, Rfl: 6 .  HYDROcodone-acetaminophen (NORCO/VICODIN) 5-325 MG tablet, One tablet by mouth every six hours as needed for pain.  Seven day limit, Disp: 28 tablet, Rfl: 0 .  lisinopril-hydrochlorothiazide (PRINZIDE,ZESTORETIC) 10-12.5 MG tablet, take 1 tablet by mouth once daily, Disp: 30 tablet, Rfl: 6 .  metFORMIN (GLUCOPHAGE) 500 MG tablet, Take 1 tablet (500 mg total) by mouth 2 (two) times daily with a meal., Disp: 60 tablet, Rfl: 6 .  Multiple Vitamins-Calcium (ONE-A-DAY WOMENS FORMULA PO), Take by mouth., Disp: , Rfl:  .  pantoprazole (PROTONIX) 40 MG tablet, Take 1 tablet (40 mg total) by mouth daily., Disp: 90 tablet, Rfl: 3      Allergies  Allergen Reactions  . Penicillins        Gave her a rash as a child      reports that she has been smoking cigarettes.  she has never used smokeless tobacco. She reports that she does not drink  alcohol or use drugs.      Family History  Problem Relation Age of Onset  . Diabetes Mother    . Hypertension Mother    . Stroke Sister      Physical Exam  Constitutional: She is oriented to person, place, and time. She appears well-developed and well-nourished.  Neurological: She is alert and oriented to person, place, and time.  Psychiatric: She has a normal mood and affect. Judgment normal.  Vitals reviewed.   Knee:left Varus aligned knee:  with no erythema or effusion or obvious bony abnormalities. Palpation abrmal with no warmth but severe joint line tenderness  ROM 115 in flexion and 5 extension  Ligaments with solid consistent endpoints including ACL, PCL, LCL, MCL. Negative Mcmurray's and provocative meniscal tests. Mild painful patellar compression. Patellar and quadriceps tendons unremarkable. Hamstring and quadriceps strength is normal. Neuro vascular exam intact    Right knee looks similar large bursal swelling as noted on the left   Varus alignment.  115 degrees of flexion 5 extension ligament solid with firm endpoints mild patella femoral compression pain patellar and  quadriceps tendon normal hamstring and quadricep strength normal neurovascular exam is intact     The x-ray done on February 6 shows varus alignment to the knee complete obliteration of the medial joint space no major osteophytes minimal subchondral bone sclerosis   Plan is to do a left total knee arthroplasty   The patient's job requires a lot of climbing kneeling squatting and bending and she is aware that she return to the same job duties.  She understands her risks for complications are increased by history of smoking and diabetes   The procedure has been fully reviewed with the patient; The risks and benefits of surgery have been discussed and explained and understood. Alternative treatment has also been reviewed, questions were encouraged and answered. The postoperative plan is also been  reviewed.

## 2018-01-26 ENCOUNTER — Other Ambulatory Visit: Payer: Self-pay

## 2018-01-26 ENCOUNTER — Inpatient Hospital Stay (HOSPITAL_COMMUNITY): Payer: BLUE CROSS/BLUE SHIELD

## 2018-01-26 ENCOUNTER — Inpatient Hospital Stay (HOSPITAL_COMMUNITY): Payer: BLUE CROSS/BLUE SHIELD | Admitting: Anesthesiology

## 2018-01-26 ENCOUNTER — Encounter (HOSPITAL_COMMUNITY)
Admission: RE | Disposition: A | Discharge status: Home Health | Payer: Self-pay | Source: Ambulatory Visit | Attending: Orthopedic Surgery

## 2018-01-26 ENCOUNTER — Inpatient Hospital Stay (HOSPITAL_COMMUNITY)
Admission: RE | Admit: 2018-01-26 | Discharge: 2018-01-28 | DRG: 470 | Disposition: A | Discharge status: Home Health | Payer: BLUE CROSS/BLUE SHIELD | Source: Ambulatory Visit | Attending: Orthopedic Surgery | Admitting: Orthopedic Surgery

## 2018-01-26 ENCOUNTER — Encounter (HOSPITAL_COMMUNITY): Payer: Self-pay | Admitting: *Deleted

## 2018-01-26 DIAGNOSIS — K219 Gastro-esophageal reflux disease without esophagitis: Secondary | ICD-10-CM | POA: Diagnosis present

## 2018-01-26 DIAGNOSIS — Z90710 Acquired absence of both cervix and uterus: Secondary | ICD-10-CM | POA: Diagnosis not present

## 2018-01-26 DIAGNOSIS — Z96659 Presence of unspecified artificial knee joint: Secondary | ICD-10-CM

## 2018-01-26 DIAGNOSIS — M1712 Unilateral primary osteoarthritis, left knee: Secondary | ICD-10-CM | POA: Diagnosis present

## 2018-01-26 DIAGNOSIS — Z880 Allergy status to penicillin: Secondary | ICD-10-CM | POA: Diagnosis not present

## 2018-01-26 DIAGNOSIS — F17210 Nicotine dependence, cigarettes, uncomplicated: Secondary | ICD-10-CM | POA: Diagnosis present

## 2018-01-26 DIAGNOSIS — Z833 Family history of diabetes mellitus: Secondary | ICD-10-CM | POA: Diagnosis not present

## 2018-01-26 DIAGNOSIS — E119 Type 2 diabetes mellitus without complications: Secondary | ICD-10-CM | POA: Diagnosis present

## 2018-01-26 DIAGNOSIS — Z23 Encounter for immunization: Secondary | ICD-10-CM | POA: Diagnosis not present

## 2018-01-26 DIAGNOSIS — I10 Essential (primary) hypertension: Secondary | ICD-10-CM | POA: Diagnosis present

## 2018-01-26 HISTORY — PX: TOTAL KNEE ARTHROPLASTY: SHX125

## 2018-01-26 LAB — GLUCOSE, CAPILLARY
Glucose-Capillary: 110 mg/dL — ABNORMAL HIGH (ref 65–99)
Glucose-Capillary: 99 mg/dL (ref 65–99)
Glucose-Capillary: 180 mg/dL — ABNORMAL HIGH (ref 65–99)
Glucose-Capillary: 203 mg/dL — ABNORMAL HIGH (ref 65–99)

## 2018-01-26 SURGERY — ARTHROPLASTY, KNEE, TOTAL
Anesthesia: Spinal | Site: Knee | Laterality: Left

## 2018-01-26 MED ORDER — CELECOXIB 400 MG PO CAPS
400.0000 mg | ORAL_CAPSULE | Freq: Once | ORAL | Status: AC
  Administered 2018-01-26: 400 mg via ORAL

## 2018-01-26 MED ORDER — METOCLOPRAMIDE HCL 5 MG/ML IJ SOLN: 5.0000 mg | Freq: Three times a day (TID) | INTRAMUSCULAR | Status: DC | PRN

## 2018-01-26 MED ORDER — BUPIVACAINE-EPINEPHRINE (PF) 0.5% -1:200000 IJ SOLN
INTRAMUSCULAR | Status: DC | PRN
  Administered 2018-01-26: 20 mL via PERINEURAL

## 2018-01-26 MED ORDER — SODIUM CHLORIDE 0.9 % IJ SOLN
INTRAMUSCULAR | Status: AC
  Filled 2018-01-26: qty 40

## 2018-01-26 MED ORDER — ONDANSETRON HCL 4 MG/2ML IJ SOLN
INTRAMUSCULAR | Status: AC
  Filled 2018-01-26: qty 2

## 2018-01-26 MED ORDER — PREGABALIN 50 MG PO CAPS
ORAL_CAPSULE | ORAL | Status: AC
  Filled 2018-01-26: qty 1

## 2018-01-26 MED ORDER — AMLODIPINE BESYLATE 5 MG PO TABS
10.0000 mg | ORAL_TABLET | Freq: Every day | ORAL | Status: DC
  Administered 2018-01-26 – 2018-01-28 (×3): 10 mg via ORAL
  Filled 2018-01-26 (×3): qty 2

## 2018-01-26 MED ORDER — DOCUSATE SODIUM 100 MG PO CAPS
100.0000 mg | ORAL_CAPSULE | Freq: Two times a day (BID) | ORAL | Status: DC
  Administered 2018-01-26 – 2018-01-28 (×5): 100 mg via ORAL
  Filled 2018-01-26 (×5): qty 1

## 2018-01-26 MED ORDER — HYDROCODONE-ACETAMINOPHEN 7.5-325 MG PO TABS: 1.0000 | ORAL_TABLET | ORAL | Status: DC | PRN

## 2018-01-26 MED ORDER — OXYCODONE HCL 5 MG PO TABS
ORAL_TABLET | ORAL | Status: AC
  Filled 2018-01-26: qty 1

## 2018-01-26 MED ORDER — ASPIRIN EC 325 MG PO TBEC
325.0000 mg | DELAYED_RELEASE_TABLET | Freq: Every day | ORAL | Status: DC
  Administered 2018-01-27 – 2018-01-28 (×2): 325 mg via ORAL
  Filled 2018-01-26 (×2): qty 1

## 2018-01-26 MED ORDER — HYDROMORPHONE HCL 1 MG/ML IJ SOLN: 0.2500 mg | INTRAMUSCULAR | Status: DC | PRN

## 2018-01-26 MED ORDER — DEXTROSE 5 % IV SOLN
INTRAVENOUS | Status: DC | PRN
  Administered 2018-01-26: 10:00:00 via INTRAVENOUS

## 2018-01-26 MED ORDER — SODIUM CHLORIDE 0.9 % IV SOLN
INTRAVENOUS | Status: DC | PRN
  Administered 2018-01-26: 60 mL

## 2018-01-26 MED ORDER — HYDROCHLOROTHIAZIDE 12.5 MG PO CAPS
12.5000 mg | ORAL_CAPSULE | Freq: Every day | ORAL | Status: DC
  Administered 2018-01-26 – 2018-01-28 (×3): 12.5 mg via ORAL
  Filled 2018-01-26 (×3): qty 1

## 2018-01-26 MED ORDER — LACTATED RINGERS IV SOLN
INTRAVENOUS | Status: DC
  Administered 2018-01-26: 1000 mL via INTRAVENOUS
  Administered 2018-01-26: 11:00:00 via INTRAVENOUS

## 2018-01-26 MED ORDER — 0.9 % SODIUM CHLORIDE (POUR BTL) OPTIME
TOPICAL | Status: DC | PRN
  Administered 2018-01-26: 1000 mL

## 2018-01-26 MED ORDER — ALUM & MAG HYDROXIDE-SIMETH 200-200-20 MG/5ML PO SUSP: 30.0000 mL | ORAL | Status: DC | PRN

## 2018-01-26 MED ORDER — PHENOL 1.4 % MT LIQD: 1.0000 | OROMUCOSAL | Status: DC | PRN

## 2018-01-26 MED ORDER — MIDAZOLAM HCL 5 MG/5ML IJ SOLN
INTRAMUSCULAR | Status: DC | PRN
  Administered 2018-01-26: 2 mg via INTRAVENOUS

## 2018-01-26 MED ORDER — BUPIVACAINE IN DEXTROSE 0.75-8.25 % IT SOLN
INTRATHECAL | Status: AC
  Filled 2018-01-26: qty 4

## 2018-01-26 MED ORDER — GABAPENTIN 300 MG PO CAPS
300.0000 mg | ORAL_CAPSULE | Freq: Three times a day (TID) | ORAL | Status: DC
  Administered 2018-01-26 – 2018-01-28 (×6): 300 mg via ORAL
  Filled 2018-01-26 (×6): qty 1

## 2018-01-26 MED ORDER — METHOCARBAMOL 1000 MG/10ML IJ SOLN
500.0000 mg | Freq: Four times a day (QID) | INTRAVENOUS | Status: DC | PRN
  Filled 2018-01-26: qty 5

## 2018-01-26 MED ORDER — INFLUENZA VAC SPLIT QUAD 0.5 ML IM SUSY
0.5000 mL | PREFILLED_SYRINGE | INTRAMUSCULAR | Status: AC
  Administered 2018-01-27: 0.5 mL via INTRAMUSCULAR
  Filled 2018-01-26: qty 0.5

## 2018-01-26 MED ORDER — ACETAMINOPHEN 325 MG PO TABS: 325.0000 mg | ORAL_TABLET | Freq: Four times a day (QID) | ORAL | Status: DC | PRN

## 2018-01-26 MED ORDER — DIPHENHYDRAMINE HCL 12.5 MG/5ML PO ELIX: 12.5000 mg | ORAL_SOLUTION | ORAL | Status: DC | PRN

## 2018-01-26 MED ORDER — VANCOMYCIN HCL IN DEXTROSE 1-5 GM/200ML-% IV SOLN
1000.0000 mg | Freq: Two times a day (BID) | INTRAVENOUS | Status: AC
  Administered 2018-01-26: 1000 mg via INTRAVENOUS
  Filled 2018-01-26: qty 200

## 2018-01-26 MED ORDER — BUPIVACAINE IN DEXTROSE 0.75-8.25 % IT SOLN
INTRATHECAL | Status: DC | PRN
  Administered 2018-01-26: 13 mg via INTRATHECAL

## 2018-01-26 MED ORDER — PROPOFOL 500 MG/50ML IV EMUL
INTRAVENOUS | Status: DC | PRN
  Administered 2018-01-26: 11:00:00 via INTRAVENOUS
  Administered 2018-01-26: 50 ug/kg/min via INTRAVENOUS

## 2018-01-26 MED ORDER — PROPOFOL 10 MG/ML IV BOLUS
INTRAVENOUS | Status: AC
  Filled 2018-01-26: qty 40

## 2018-01-26 MED ORDER — METOCLOPRAMIDE HCL 10 MG PO TABS: 5.0000 mg | ORAL_TABLET | Freq: Three times a day (TID) | ORAL | Status: DC | PRN

## 2018-01-26 MED ORDER — TRAMADOL HCL 50 MG PO TABS
50.0000 mg | ORAL_TABLET | Freq: Four times a day (QID) | ORAL | Status: DC
Start: 2018-01-26 — End: 2018-01-28
  Administered 2018-01-26 – 2018-01-28 (×8): 50 mg via ORAL
  Filled 2018-01-26 (×8): qty 1

## 2018-01-26 MED ORDER — ACETAMINOPHEN 500 MG PO TABS
500.0000 mg | ORAL_TABLET | Freq: Four times a day (QID) | ORAL | Status: AC
  Administered 2018-01-26 – 2018-01-27 (×3): 500 mg via ORAL
  Filled 2018-01-26 (×3): qty 1

## 2018-01-26 MED ORDER — MIDAZOLAM HCL 2 MG/2ML IJ SOLN
INTRAMUSCULAR | Status: AC
  Filled 2018-01-26: qty 2

## 2018-01-26 MED ORDER — CHLORHEXIDINE GLUCONATE 4 % EX LIQD: 60.0000 mL | Freq: Once | CUTANEOUS | Status: DC

## 2018-01-26 MED ORDER — HYDROCODONE-ACETAMINOPHEN 5-325 MG PO TABS
1.0000 | ORAL_TABLET | ORAL | Status: DC | PRN
  Administered 2018-01-26 – 2018-01-28 (×3): 2 via ORAL
  Filled 2018-01-26 (×2): qty 2
  Filled 2018-01-26: qty 1
  Filled 2018-01-26: qty 2

## 2018-01-26 MED ORDER — ADULT MULTIVITAMIN W/MINERALS CH
1.0000 | ORAL_TABLET | Freq: Every day | ORAL | Status: DC
  Administered 2018-01-26 – 2018-01-28 (×3): 1 via ORAL
  Filled 2018-01-26 (×3): qty 1

## 2018-01-26 MED ORDER — DEXAMETHASONE SODIUM PHOSPHATE 10 MG/ML IJ SOLN
10.0000 mg | Freq: Once | INTRAMUSCULAR | Status: AC
  Administered 2018-01-27: 10 mg via INTRAVENOUS
  Filled 2018-01-26: qty 1

## 2018-01-26 MED ORDER — ONDANSETRON HCL 4 MG/2ML IJ SOLN
4.0000 mg | Freq: Once | INTRAMUSCULAR | Status: AC
  Administered 2018-01-26: 4 mg via INTRAVENOUS

## 2018-01-26 MED ORDER — LIDOCAINE HCL (PF) 1 % IJ SOLN
INTRAMUSCULAR | Status: AC
  Filled 2018-01-26: qty 5

## 2018-01-26 MED ORDER — OXYCODONE HCL 5 MG PO TABS
5.0000 mg | ORAL_TABLET | Freq: Once | ORAL | Status: AC
  Administered 2018-01-26: 5 mg via ORAL

## 2018-01-26 MED ORDER — BUPIVACAINE LIPOSOME 1.3 % IJ SUSP
INTRAMUSCULAR | Status: AC
  Filled 2018-01-26: qty 20

## 2018-01-26 MED ORDER — CELECOXIB 400 MG PO CAPS
ORAL_CAPSULE | ORAL | Status: AC
  Filled 2018-01-26: qty 1

## 2018-01-26 MED ORDER — KETOROLAC TROMETHAMINE 15 MG/ML IJ SOLN
7.5000 mg | Freq: Four times a day (QID) | INTRAMUSCULAR | Status: AC
  Administered 2018-01-26 – 2018-01-27 (×4): 7.5 mg via INTRAVENOUS
  Filled 2018-01-26 (×4): qty 1

## 2018-01-26 MED ORDER — ACETAMINOPHEN 500 MG PO TABS
500.0000 mg | ORAL_TABLET | Freq: Once | ORAL | Status: AC
  Administered 2018-01-26: 500 mg via ORAL

## 2018-01-26 MED ORDER — PREGABALIN 50 MG PO CAPS
50.0000 mg | ORAL_CAPSULE | Freq: Once | ORAL | Status: AC
  Administered 2018-01-26: 50 mg via ORAL

## 2018-01-26 MED ORDER — FENTANYL CITRATE (PF) 100 MCG/2ML IJ SOLN
INTRAMUSCULAR | Status: DC | PRN
  Administered 2018-01-26: 25 ug via INTRAVENOUS
  Administered 2018-01-26: 25 ug via INTRATHECAL

## 2018-01-26 MED ORDER — ACETAMINOPHEN 500 MG PO TABS
ORAL_TABLET | ORAL | Status: AC
  Filled 2018-01-26: qty 1

## 2018-01-26 MED ORDER — EPHEDRINE SULFATE 50 MG/ML IJ SOLN
INTRAMUSCULAR | Status: AC
  Filled 2018-01-26: qty 1

## 2018-01-26 MED ORDER — FENTANYL CITRATE (PF) 100 MCG/2ML IJ SOLN
25.0000 ug | Freq: Once | INTRAMUSCULAR | Status: AC
  Administered 2018-01-26: 25 ug via INTRAVENOUS

## 2018-01-26 MED ORDER — SODIUM CHLORIDE 0.9 % IR SOLN
Status: DC | PRN
  Administered 2018-01-26: 3000 mL

## 2018-01-26 MED ORDER — FENTANYL CITRATE (PF) 100 MCG/2ML IJ SOLN
INTRAMUSCULAR | Status: AC
  Filled 2018-01-26: qty 2

## 2018-01-26 MED ORDER — VANCOMYCIN HCL IN DEXTROSE 1-5 GM/200ML-% IV SOLN
1000.0000 mg | INTRAVENOUS | Status: AC
  Administered 2018-01-26: 1000 mg via INTRAVENOUS
  Filled 2018-01-26: qty 200

## 2018-01-26 MED ORDER — MIDAZOLAM HCL 2 MG/2ML IJ SOLN
1.0000 mg | INTRAMUSCULAR | Status: DC
  Administered 2018-01-26: 2 mg via INTRAVENOUS

## 2018-01-26 MED ORDER — BUPIVACAINE-EPINEPHRINE (PF) 0.5% -1:200000 IJ SOLN
INTRAMUSCULAR | Status: AC
  Filled 2018-01-26: qty 30

## 2018-01-26 MED ORDER — SODIUM CHLORIDE 0.9 % IV SOLN
INTRAVENOUS | Status: DC
  Administered 2018-01-26 – 2018-01-27 (×3): via INTRAVENOUS

## 2018-01-26 MED ORDER — INSULIN ASPART 100 UNIT/ML ~~LOC~~ SOLN
0.0000 [IU] | Freq: Three times a day (TID) | SUBCUTANEOUS | Status: DC
  Filled 2018-01-26: qty 0.09

## 2018-01-26 MED ORDER — ONDANSETRON HCL 4 MG/2ML IJ SOLN: 4.0000 mg | Freq: Four times a day (QID) | INTRAMUSCULAR | Status: DC | PRN

## 2018-01-26 MED ORDER — LISINOPRIL 10 MG PO TABS
10.0000 mg | ORAL_TABLET | Freq: Every day | ORAL | Status: DC
  Administered 2018-01-26 – 2018-01-28 (×3): 10 mg via ORAL
  Filled 2018-01-26 (×3): qty 1

## 2018-01-26 MED ORDER — DEXTROSE 5 % IV SOLN
500.0000 mg | Freq: Once | INTRAVENOUS | Status: AC
  Administered 2018-01-26: 500 mg via INTRAVENOUS
  Filled 2018-01-26: qty 550

## 2018-01-26 MED ORDER — BISACODYL 10 MG RE SUPP: 10.0000 mg | Freq: Every day | RECTAL | Status: DC | PRN

## 2018-01-26 MED ORDER — SODIUM CHLORIDE 0.9 % IV SOLN
1000.0000 mg | Freq: Once | INTRAVENOUS | Status: AC
  Administered 2018-01-26: 1000 mg via INTRAVENOUS
  Filled 2018-01-26: qty 10

## 2018-01-26 MED ORDER — PANTOPRAZOLE SODIUM 40 MG PO TBEC
40.0000 mg | DELAYED_RELEASE_TABLET | Freq: Every day | ORAL | Status: DC
  Administered 2018-01-26 – 2018-01-28 (×3): 40 mg via ORAL
  Filled 2018-01-26 (×3): qty 1

## 2018-01-26 MED ORDER — FLEET ENEMA 7-19 GM/118ML RE ENEM: 1.0000 | ENEMA | Freq: Once | RECTAL | Status: DC | PRN

## 2018-01-26 MED ORDER — ONDANSETRON HCL 4 MG PO TABS: 4.0000 mg | ORAL_TABLET | Freq: Four times a day (QID) | ORAL | Status: DC | PRN

## 2018-01-26 MED ORDER — MORPHINE SULFATE (PF) 2 MG/ML IV SOLN: 0.5000 mg | INTRAVENOUS | Status: DC | PRN

## 2018-01-26 MED ORDER — SODIUM CHLORIDE 0.9 % IJ SOLN
INTRAMUSCULAR | Status: AC
  Filled 2018-01-26: qty 10

## 2018-01-26 MED ORDER — METHOCARBAMOL 500 MG PO TABS: 500.0000 mg | ORAL_TABLET | Freq: Four times a day (QID) | ORAL | Status: DC | PRN

## 2018-01-26 MED ORDER — PANTOPRAZOLE SODIUM 40 MG PO TBEC: 40.0000 mg | DELAYED_RELEASE_TABLET | Freq: Every day | ORAL | Status: DC

## 2018-01-26 MED ORDER — METFORMIN HCL 500 MG PO TABS
500.0000 mg | ORAL_TABLET | Freq: Two times a day (BID) | ORAL | Status: DC
  Administered 2018-01-26 – 2018-01-28 (×4): 500 mg via ORAL
  Filled 2018-01-26 (×4): qty 1

## 2018-01-26 MED ORDER — LISINOPRIL-HYDROCHLOROTHIAZIDE 10-12.5 MG PO TABS: 1.0000 | ORAL_TABLET | Freq: Every day | ORAL | Status: DC

## 2018-01-26 MED ORDER — MENTHOL 3 MG MT LOZG: 1.0000 | LOZENGE | OROMUCOSAL | Status: DC | PRN

## 2018-01-26 MED ORDER — SENNOSIDES-DOCUSATE SODIUM 8.6-50 MG PO TABS: 1.0000 | ORAL_TABLET | Freq: Every evening | ORAL | Status: DC | PRN

## 2018-01-26 MED ORDER — CELECOXIB 100 MG PO CAPS
200.0000 mg | ORAL_CAPSULE | Freq: Two times a day (BID) | ORAL | Status: DC
  Administered 2018-01-26 – 2018-01-28 (×4): 200 mg via ORAL
  Filled 2018-01-26 (×4): qty 2

## 2018-01-26 SURGICAL SUPPLY — 68 items
BAG HAMPER (MISCELLANEOUS) ×2 IMPLANT
BANDAGE ELASTIC 4 LF NS (GAUZE/BANDAGES/DRESSINGS) ×4 IMPLANT
BANDAGE ELASTIC 6 LF NS (GAUZE/BANDAGES/DRESSINGS) ×2 IMPLANT
BANDAGE ESMARK 6X9 LF (GAUZE/BANDAGES/DRESSINGS) ×1 IMPLANT
BIT DRILL 3.2X128 (BIT) IMPLANT
BLADE HEX COATED 2.75 (ELECTRODE) ×2 IMPLANT
BLADE SAGITTAL 25.0X1.27X90 (BLADE) ×2 IMPLANT
BNDG CMPR 9X6 STRL LF SNTH (GAUZE/BANDAGES/DRESSINGS) ×1
BNDG CMPR MED 5X4 ELC HKLP NS (GAUZE/BANDAGES/DRESSINGS) ×2
BNDG CMPR MED 5X6 ELC HKLP NS (GAUZE/BANDAGES/DRESSINGS) ×1
BNDG ESMARK 6X9 LF (GAUZE/BANDAGES/DRESSINGS) ×2
CAP KNEE TOTAL 3 SIGMA ×1 IMPLANT
CEMENT HV SMART SET (Cement) ×4 IMPLANT
CLOTH BEACON ORANGE TIMEOUT ST (SAFETY) ×2 IMPLANT
COOLER CRYO CUFF IC AND MOTOR (MISCELLANEOUS) ×2 IMPLANT
COVER LIGHT HANDLE STERIS (MISCELLANEOUS) ×4 IMPLANT
CUFF CRYO KNEE18X23 MED (MISCELLANEOUS) ×1 IMPLANT
CUFF TOURNIQUET SINGLE 34IN LL (TOURNIQUET CUFF) ×1 IMPLANT
DECANTER SPIKE VIAL GLASS SM (MISCELLANEOUS) ×3 IMPLANT
DRAPE BACK TABLE (DRAPES) ×2 IMPLANT
DRAPE EXTREMITY T 121X128X90 (DRAPE) ×2 IMPLANT
DRESSING AQUACEL AG ADV 3.5X12 (MISCELLANEOUS) ×1 IMPLANT
DRSG AQUACEL AG ADV 3.5X12 (MISCELLANEOUS) ×2
DURAPREP 26ML APPLICATOR (WOUND CARE) ×4 IMPLANT
ELECT REM PT RETURN 9FT ADLT (ELECTROSURGICAL) ×2
ELECTRODE REM PT RTRN 9FT ADLT (ELECTROSURGICAL) ×1 IMPLANT
GLOVE BIO SURGEON STRL SZ7 (GLOVE) ×4 IMPLANT
GLOVE BIOGEL PI IND STRL 7.0 (GLOVE) ×2 IMPLANT
GLOVE BIOGEL PI INDICATOR 7.0 (GLOVE) ×4
GLOVE SKINSENSE NS SZ8.0 LF (GLOVE) ×2
GLOVE SKINSENSE STRL SZ8.0 LF (GLOVE) ×2 IMPLANT
GLOVE SS N UNI LF 8.5 STRL (GLOVE) ×2 IMPLANT
GOWN STRL REUS W/ TWL LRG LVL3 (GOWN DISPOSABLE) ×1 IMPLANT
GOWN STRL REUS W/TWL LRG LVL3 (GOWN DISPOSABLE) ×7 IMPLANT
GOWN STRL REUS W/TWL XL LVL3 (GOWN DISPOSABLE) ×2 IMPLANT
HANDPIECE INTERPULSE COAX TIP (DISPOSABLE) ×2
HOOD W/PEELAWAY (MISCELLANEOUS) ×9 IMPLANT
INST SET MAJOR BONE (KITS) ×2 IMPLANT
IV NS IRRIG 3000ML ARTHROMATIC (IV SOLUTION) ×2 IMPLANT
KIT BLADEGUARD II DBL (SET/KITS/TRAYS/PACK) ×2 IMPLANT
KIT ROOM TURNOVER AP CYSTO (KITS) ×2 IMPLANT
MANIFOLD NEPTUNE II (INSTRUMENTS) ×2 IMPLANT
MARKER SKIN DUAL TIP RULER LAB (MISCELLANEOUS) ×2 IMPLANT
NDL HYPO 21X1.5 SAFETY (NEEDLE) ×1 IMPLANT
NEEDLE HYPO 21X1.5 SAFETY (NEEDLE) ×2 IMPLANT
NS IRRIG 1000ML POUR BTL (IV SOLUTION) ×2 IMPLANT
PACK TOTAL JOINT (CUSTOM PROCEDURE TRAY) ×2 IMPLANT
PAD ARMBOARD 7.5X6 YLW CONV (MISCELLANEOUS) ×2 IMPLANT
PAD DANNIFLEX CPM (ORTHOPEDIC SUPPLIES) ×2 IMPLANT
PILLOW KNEE EXTENSION 0 DEG (MISCELLANEOUS) ×2 IMPLANT
PIN TROCAR 3 INCH (PIN) IMPLANT
SAW OSC TIP CART 19.5X105X1.3 (SAW) ×2 IMPLANT
SET BASIN LINEN APH (SET/KITS/TRAYS/PACK) ×2 IMPLANT
SET HNDPC FAN SPRY TIP SCT (DISPOSABLE) ×1 IMPLANT
STAPLER VISISTAT 35W (STAPLE) ×2 IMPLANT
SUT BRALON NAB BRD #1 30IN (SUTURE) ×4 IMPLANT
SUT MNCRL 0 VIOLET CTX 36 (SUTURE) ×1 IMPLANT
SUT MON AB 0 CT1 (SUTURE) ×2 IMPLANT
SUT MON AB 2-0 CT1 36 (SUTURE) IMPLANT
SUT MONOCRYL 0 CTX 36 (SUTURE) ×1
SYR 20CC LL (SYRINGE) ×4 IMPLANT
SYR 30ML LL (SYRINGE) ×3 IMPLANT
SYR BULB IRRIGATION 50ML (SYRINGE) ×2 IMPLANT
TOWEL OR 17X26 4PK STRL BLUE (TOWEL DISPOSABLE) ×2 IMPLANT
TOWER CARTRIDGE SMART MIX (DISPOSABLE) ×2 IMPLANT
TRAY FOLEY W/METER SILVER 16FR (SET/KITS/TRAYS/PACK) ×2 IMPLANT
WATER STERILE IRR 1000ML POUR (IV SOLUTION) ×4 IMPLANT
YANKAUER SUCT 12FT TUBE ARGYLE (SUCTIONS) ×2 IMPLANT

## 2018-01-26 NOTE — Transfer of Care (Signed)
Immediate Anesthesia Transfer of Care Note  Patient: Danielle Hicks  Procedure(s) Performed: TOTAL KNEE ARTHROPLASTY (Left Knee)  Patient Location: PACU  Anesthesia Type:MAC  Level of Consciousness: awake, alert , oriented and patient cooperative  Airway & Oxygen Therapy: Patient Spontanous Breathing  Post-op Assessment: Report given to RN and Post -op Vital signs reviewed and stable  Post vital signs: Reviewed and stable  Last Vitals:  Vitals:   01/26/18 0935 01/26/18 0940  BP: 117/77 116/79  Pulse:    Resp: 15 13  Temp:    SpO2: 95% 100%    Last Pain:  Vitals:   01/26/18 0835  TempSrc: Oral  PainSc: 4       Patients Stated Pain Goal: 8 (70/96/43 [id number])  Complications: No apparent anesthesia complications

## 2018-01-26 NOTE — Evaluation (Signed)
Physical Therapy Evaluation Patient Details Name: Danielle Hicks MRN: [id number] DOB: 1956/10/01 Today's Date: 01/26/2018   History of Present Illness   PT with chronic left knee pain who has failed conservative therapy and is now having a Lt TKR.   Clinical Impression  Ms. Cisney is very pleasant and willing to participate in therapy.  She was able to complete all exercises and, in fact, wanted to get up.  Therapist explained that due to the spinal it would be best if we wait until tomorrow to get up.      Follow Up Recommendations Home health PT;Outpatient PT    Equipment Recommendations  Rolling walker with 5" wheels    Recommendations for Other Services   none    Precautions / Restrictions Precautions Precautions: Other (comment) Precaution Comments: Spinal Restrictions Weight Bearing Restrictions: No      Mobility  Bed Mobility               General bed mobility comments: Not tested at this time due to spinal                  Pertinent Vitals/Pain Pain Assessment: 0-10 Pain Score: 3  Pain Location: Lt knee Pain Descriptors / Indicators: Aching Pain Intervention(s): Limited activity within patient's tolerance;Other (comment)(Pressed call button for nurse to be notified that pt pain was increasing )    Home Living Family/patient expects to be discharged to:: Private residence Living Arrangements: Spouse/significant other Available Help at Discharge: Family Type of Home: House Home Access: Stairs to enter Entrance Stairs-Rails: Right Entrance Stairs-Number of Steps: 1 Home Layout: One level Home Equipment: None      Prior Function Level of Independence: Independent                  Extremity/Trunk Assessment        Lower Extremity Assessment Lower Extremity Assessment: Overall WFL for tasks assessed       Communication   Communication: No difficulties  Cognition Arousal/Alertness: Awake/alert Behavior During Therapy: WFL  for tasks assessed/performed Overall Cognitive Status: Within Functional Limits for tasks assessed                                        General Comments      Exercises Total Joint Exercises Ankle Circles/Pumps: AROM;Both;10 reps Quad Sets: Both;10 reps Gluteal Sets: Both;10 reps Heel Slides: Left;5 reps Hip ABduction/ADduction: Left;10 reps   Assessment/Plan    PT Assessment Patient needs continued PT services  PT Problem List Decreased strength;Decreased range of motion;Decreased activity tolerance;Pain;Decreased mobility;Decreased balance       PT Treatment Interventions Gait training;Therapeutic activities;Therapeutic exercise;Functional mobility training;Stair training;Patient/family education    PT Goals (Current goals can be found in the Care Plan section)  Acute Rehab PT Goals Patient Stated Goal: to go home  PT Goal Formulation: With patient Time For Goal Achievement: 01/28/18 Potential to Achieve Goals: Good    Frequency BID   Barriers to discharge    none noted           End of Session   Activity Tolerance: Patient tolerated treatment well Patient left: in bed;with call bell/phone within reach;with family/visitor present   PT Visit Diagnosis: Difficulty in walking, not elsewhere classified (R26.2);Pain;Other (comment)(stiffness of knee ) Pain - Right/Left: Left Pain - part of body: Knee    Time: 1346-1411 PT Time Calculation (min) (ACUTE ONLY):  25 min   Charges:   PT Evaluation $PT Eval Low Complexity: [place], PT CLT [phone] 01/26/2018, 2:12 PM

## 2018-01-26 NOTE — Interval H&P Note (Signed)
History and Physical Interval Note:  01/26/2018 9:42 AM  Danielle Hicks  has presented today for surgery, with the diagnosis of PRIMARY OSTEOARTHRITIS KNEE  The various methods of treatment have been discussed with the patient and family. After consideration of risks, benefits and other options for treatment, the patient has consented to  Procedure(s): TOTAL KNEE ARTHROPLASTY (Left) as a surgical intervention .  The patient's history has been reviewed, patient examined, no change in status, stable for surgery.  I have reviewed the patient's chart and labs.  Questions were answered to the patient's satisfaction.     Arther Abbott

## 2018-01-26 NOTE — Anesthesia Postprocedure Evaluation (Signed)
Anesthesia Post Note  Patient: Danielle Hicks  Procedure(s) Performed: TOTAL KNEE ARTHROPLASTY (Left Knee)  Patient location during evaluation: PACU Anesthesia Type: Spinal Level of consciousness: oriented, awake and alert and patient cooperative Pain management: pain level controlled Vital Signs Assessment: post-procedure vital signs reviewed and stable Respiratory status: spontaneous breathing and respiratory function stable Cardiovascular status: stable Postop Assessment: no apparent nausea or vomiting and patient able to bend at knees Anesthetic complications: no     Last Vitals:  Vitals:   01/26/18 1300 01/26/18 1315  BP: (!) 136/56 127/79  Pulse: (!) 52 (!) 50  Resp: (!) 23 12  Temp: (!) 36.4 C   SpO2: 99% 97%    Last Pain:  Vitals:   01/26/18 1315  TempSrc:   PainSc: 0-No pain                 ADAMS, AMY A

## 2018-01-26 NOTE — Brief Op Note (Addendum)
01/26/2018  12:05 PM  PATIENT:  Cali E Roza  62 y.o. female  PRE-OPERATIVE DIAGNOSIS:  PRIMARY OSTEOARTHRITIS KNEE  POST-OPERATIVE DIAGNOSIS:  PRIMARY OSTEOARTHRITIS KNEE  PROCEDURE:  Procedure(s): TOTAL KNEE ARTHROPLASTY (Left) [id number]  DEPUY SIGMA FB PS 2.1F 2T 10 PS NO PATELLA  SURGEON:  Surgeon(s) and Role:    * Carole Civil, MD - Primary  PHYSICIAN ASSISTANT:   ASSISTANTS: betty ashley and cynthia wrenn   ANESTHESIA:   spinal  EBL:  25 mL   BLOOD ADMINISTERED:none  DRAINS: none   LOCAL MEDICATIONS USED:  MARCAINE   , Amount: 30 ml and OTHER exparel 20   SPECIMEN:  No Specimen  DISPOSITION OF SPECIMEN:  N/A  COUNTS:  YES  TOURNIQUET:   Total Tourniquet Time Documented: Thigh (Left) - 86 minutes Total: Thigh (Left) - 86 minutes   DICTATION: .Viviann Spare Dictation  PLAN OF CARE: Admit to inpatient   PATIENT DISPOSITION:  PACU - hemodynamically stable.   Delay start of Pharmacological VTE agent (>24hrs) due to surgical blood loss or risk of bleeding: yes

## 2018-01-26 NOTE — Anesthesia Procedure Notes (Signed)
Spinal  Patient location during procedure: OR Start time: 01/26/2018 10:02 AM Staffing Resident/CRNA: Mickel Baas, CRNA Preanesthetic Checklist Completed: patient identified, site marked, surgical consent, pre-op evaluation, timeout performed, IV checked, risks and benefits discussed and monitors and equipment checked Spinal Block Patient position: left lateral decubitus Prep: Betadine Patient monitoring: heart rate, cardiac monitor, continuous pulse ox and blood pressure Approach: left paramedian Location: L3-4 Injection technique: single-shot Needle Needle type: Spinocan  Needle gauge: 22 G Needle length: 9 cm Assessment Sensory level: T8 Additional Notes  ATTEMPTS:1 TRAY IZ:[id number] TRAY EXPIRATION DATE: 05/10/19

## 2018-01-26 NOTE — Anesthesia Procedure Notes (Signed)
Procedure Name: MAC Date/Time: 01/26/2018 9:47 AM Performed by: Andree Elk Caidance Sybert A, CRNA Pre-anesthesia Checklist: Patient identified, Timeout performed, Emergency Drugs available, Suction available and Patient being monitored Oxygen Delivery Method: Simple face mask

## 2018-01-26 NOTE — Care Management (Signed)
Patient Information   Patient Name Vanilla, [place] ([id number]) Sex Female DOB [birth year]  Room Bed  A324 A324-01  Patient Demographics   Address [address] [place] [address] Phone [phone] [facility]) [phone] (Work) [phone] (Mobile) *Preferred* E-mail Address [email or web address]  Patient Ethnicity & Race   Ethnic Group Patient Race  Not Hispanic or Latino Black or African American  Emergency Contact(s)   Name Relation Home Work Mobile  STEPHANIA, MACFARLANE  [phone]  [phone]  Documents on File    Status Date Received Description  Documents for the Patient  [place] [facility] Received 10/04/12   [place] E-Signature HIPAA Notice of Privacy Received 46/27/03 BSFM  Driver's License Received 50/09/38 EXP 04/12/2024  Insurance Card Not Received    Advance Directives/Living Will/HCPOA/POA Not Received    Insurance Card Not Received    [employer] [facility] HIPAA NOTICE OF PRIVACY - Scanned Not Received    AMB Correspondence Not Received  02/14 Office Note [employer] [address]  Insurance Card Received 12/31/12   Insurance Card Not Received    AMB Intake Forms/Questionnaires Not Received    Release of Information Not Received    [place] E-Signature HIPAA Notice of Privacy Spanish Received 07/04/13   Advanced Beneficiary Notice (ABN) Not Received    Insurance Card Received 12/12/13 new card  Insurance Card Not Received    Insurance Card   bsbc  E-Signature AOB Spanish Not Received    Other Photo ID Not Received    Insurance Card   blue cross  AMB Correspondence  18/29/93 PRECERTIFICATION NOTIFICATION INDEPENDENCE [employer]  Insurance Card   blue cross  Insurance Card Received 01/24/16 BCBS 2017  Release of Information Received 01/24/16 DPR Signed on 3.16.17 / Cancellation - No Show Policy RCGD  AMB Correspondence  11/03/15 NOTICE OF CANCELLATION [employer]  Insurance Card   blue cross  Insurance Card Received 03/11/16 BCBS/BCBS/RCGD/HHS  HIM ROI  Authorization  03/17/16   Insurance Card Received 04/01/16 BCBSx2/ROSM  Insurance Card Received 06/02/17 bcbs 11/10/2016  Insurance Card Received 06/02/17 bcbs 11/10/2016  Insurance Card Received 12/31/16 BCBS/BCBS/ROSM  Insurance Card Received 12/31/16 BCBS/BCBS/ROSM  Insurance Card Received 01/26/17 ROSM/BCBS and BCBS  Release of Information Received 06/02/17 [facility] DPR Please not discuss with anyone. May leave a message on cell phone.  Insurance Card Received 12/01/17 bcbs prime 2019  Insurance Card Received 12/01/17 bcbs 2019 secondary  Insurance Card Received 12/16/17 ROSM/BCBS & BCBS  AMB Outside ED Note Received 01/04/18 [facility] [place]  AMB Provider Completed Forms Received 01/06/18 FMAL CIGNA  AMB Outside Hospital Record Received 01/04/18 [facility]  AMB Correspondence Received 01/07/18 IP APPROVAL CLINICAL SERVICES  AMB Provider Completed Forms Received 01/15/18 LETTER DFISABILITY MEDICAL REQUEST CIGNA  AMB Outside Hospital Record Received (Deleted) 01/04/18 [facility]  Patient Photo   Photo of Patient  Documents for the Encounter  AOB (Assignment of Insurance Benefits) Not Received    E-signature AOB Signed 01/26/18   MEDICARE RIGHTS Not Received    E-signature Medicare Rights     Admission Information   Attending Provider Admitting Provider Admission Type Admission Date/Time  Carole Civil, MD Carole Civil, MD Elective 01/26/18 0801  Discharge Date Hospital Service Auth/Cert Status Service Area   Surgery Incomplete [place]  Unit Room/Bed Admission Status   AP-DEPT 300 A324/A324-01 Admission (Confirmed)   Admission   Complaint  PRIMARY [facility] Account   Name Acct ID Class Status Primary Coverage  Italiano, Rayyan  E [id number] Inpatient Open BLUE CROSS BLUE SHIELD - BCBS OTHER      Guarantor Account (for Hospital Account [id number])   Name Relation to Blanco? Acct Type  Ronaldo Miyamoto Self CHSA Yes Personal/Family  Address Phone    [place], [place] [address] [phone]) [phone])        Coverage Information (for Hospital Account [id number])   1. BLUE CROSS BLUE SHIELD/BCBS OTHER   F/O Payor/Plan Precert #  BLUE CROSS BLUE SHIELD/BCBS OTHER   Subscriber Subscriber #  Issabella, Rix [id number]  Address Phone  [address] [place], [place] [address] [phone]  2. BLUE CROSS BLUE SHIELD/BCBS OTHER   F/O Payor/Plan Precert #  BLUE CROSS BLUE SHIELD/BCBS OTHER   Subscriber Subscriber #  Meilin, Brosh [id number]  Address Phone  [address] [place], [place] [address]

## 2018-01-26 NOTE — Anesthesia Preprocedure Evaluation (Addendum)
Anesthesia Evaluation  Patient identified by MRN, date of birth, ID band Patient awake    Reviewed: Allergy & Precautions, NPO status , Patient's Chart, lab work & pertinent test results  Airway Mallampati: III  TM Distance: <3 FB Neck ROM: Full    Dental  (+) Teeth Intact   Pulmonary Current Smoker,    breath sounds clear to auscultation       Cardiovascular hypertension, Pt. on medications  Rhythm:Regular Rate:Normal     Neuro/Psych  Headaches, PSYCHIATRIC DISORDERS Depression    GI/Hepatic GERD  Medicated,  Endo/Other  diabetes, Type 2  Renal/GU      Musculoskeletal  (+) Arthritis ,   Abdominal   Peds  Hematology   Anesthesia Other Findings   Reproductive/Obstetrics                             Anesthesia Physical Anesthesia Plan  ASA: III  Anesthesia Plan: Spinal   Post-op Pain Management:    Induction: Intravenous  PONV Risk Score and Plan:   Airway Management Planned: Simple Face Mask  Additional Equipment:   Intra-op Plan:   Post-operative Plan:   Informed Consent: I have reviewed the patients History and Physical, chart, labs and discussed the procedure including the risks, benefits and alternatives for the proposed anesthesia with the patient or authorized representative who has indicated his/her understanding and acceptance.     Plan Discussed with:   Anesthesia Plan Comments:         Anesthesia Quick Evaluation

## 2018-01-27 ENCOUNTER — Encounter (HOSPITAL_COMMUNITY): Payer: Self-pay | Admitting: Orthopedic Surgery

## 2018-01-27 LAB — BASIC METABOLIC PANEL
Anion gap: 9 (ref 5–15)
BUN: 10 mg/dL (ref 6–20)
CO2: 24 mmol/L (ref 22–32)
Calcium: 8.5 mg/dL — ABNORMAL LOW (ref 8.9–10.3)
Chloride: 102 mmol/L (ref 101–111)
Creatinine, Ser: 0.68 mg/dL (ref 0.44–1.00)
GFR calc Af Amer: >60 mL/min (ref >60)
GFR calc non Af Amer: >60 mL/min (ref >60)
Glucose, Bld: 119 mg/dL — ABNORMAL HIGH (ref 65–99)
Potassium: 3.8 mmol/L (ref 3.5–5.1)
Sodium: 135 mmol/L (ref 135–145)

## 2018-01-27 LAB — CBC
HCT: 32.7 % — ABNORMAL LOW (ref 36.0–46.0)
Hemoglobin: 10.4 g/dL — ABNORMAL LOW (ref 12.0–15.0)
MCH: 25.9 pg — ABNORMAL LOW (ref 26.0–34.0)
MCHC: 31.8 g/dL (ref 30.0–36.0)
MCV: 81.5 fL (ref 78.0–100.0)
Platelets: 306 10*3/uL (ref 150–400)
RBC: 4.01 MIL/uL (ref 3.87–5.11)
RDW: 17 % — ABNORMAL HIGH (ref 11.5–15.5)
WBC: 9.3 10*3/uL (ref 4.0–10.5)

## 2018-01-27 LAB — GLUCOSE, CAPILLARY
Glucose-Capillary: 143 mg/dL — ABNORMAL HIGH (ref 65–99)
Glucose-Capillary: 169 mg/dL — ABNORMAL HIGH (ref 65–99)
Glucose-Capillary: 238 mg/dL — ABNORMAL HIGH (ref 65–99)
Glucose-Capillary: 231 mg/dL — ABNORMAL HIGH (ref 65–99)

## 2018-01-27 NOTE — Clinical Social Work Note (Signed)
CSW consult received for SNF placement. This appears to be a standing order. PT recommending home with [place] [facility]. RN CM will assist with this dc need. Will clear the consult.

## 2018-01-27 NOTE — Progress Notes (Signed)
Physical Therapy Treatment Patient Details Name: Danielle Hicks MRN: [id number] DOB: [birth year] Today's Date: 01/27/2018  LEFT KNEE ROM:  2-90 degrees AMBULATION DISTANCE:  75 feet using RW with Supervision    History of Present Illness Danielle Hicks is a 62 y/o femlae s/p Left TKA 01/26/17 with hx of dull aching medial left knee pain now for several months associated with swelling decreased range of motion trouble getting in a chair    PT Comments    Patient demonstrates good return for completing quad sets and self stretching of left knee using RLE, ambulated in hallway without loss of balance and tolerated sitting up in chair with LLE dangling after therapy.  Patient will benefit from continued physical therapy in hospital and recommended venue below to increase strength, balance, endurance for safe ADLs and gait.   Follow Up Recommendations  Home health PT;Supervision - Intermittent     Equipment Recommendations  Rolling walker with 5" wheels    Recommendations for Other Services       Precautions / Restrictions Precautions Precautions: Fall Precaution Comments: s/p Left TKA Restrictions Weight Bearing Restrictions: Yes Other Position/Activity Restrictions: WBAT LLE, no pillows underneath left knee    Mobility  Bed Mobility Overal bed mobility: Needs Assistance Bed Mobility: Supine to Sit     Supine to sit: Min guard     General bed mobility comments: assist to move LLE off bed  Transfers Overall transfer level: Needs assistance Equipment used: Rolling walker (2 wheeled) Transfers: Sit to/from [facility] Sit to Stand: Supervision Stand pivot transfers: Supervision       General transfer comment: slightly labored movement  Ambulation/Gait Ambulation/Gait assistance: Supervision Ambulation Distance (Feet): 75 Feet Assistive device: Rolling walker (2 wheeled) Gait Pattern/deviations: Decreased step length - left;Decreased stance time  - left;Decreased stride length   Gait velocity interpretation: Below normal speed for age/gender General Gait Details: demonstrates fair/good return for left heel to toe stepping after verbal cues, no loss of balance, mostly limited due to c/o fatigue   Stairs            Wheelchair Mobility    Modified Rankin (Stroke Patients Only)       Balance Overall balance assessment: Needs assistance Sitting-balance support: Feet supported;No upper extremity supported Sitting balance-Leahy Scale: Good     Standing balance support: No upper extremity supported;During functional activity Standing balance-Leahy Scale: Fair Standing balance comment: fair/good with RW                            Cognition Arousal/Alertness: Awake/alert Behavior During Therapy: WFL for tasks assessed/performed Overall Cognitive Status: Within Functional Limits for tasks assessed                                        Exercises Total Joint Exercises Quad Sets: Supine;AROM;Strengthening;Left;10 reps Heel Slides: Supine;AROM;Strengthening;Left;10 reps Knee Flexion: Left(seated self flexion of left knee using RLE) Goniometric ROM: LEFT KNEE: 2-90 DEGREES    General Comments        Pertinent Vitals/Pain Pain Score: 8  Pain Location: left knee Pain Descriptors / Indicators: Aching;Discomfort Pain Intervention(s): Limited activity within patient's tolerance;Monitored during session    Home Living                      Prior Function  PT Goals (current goals can now be found in the care plan section) Acute Rehab PT Goals Patient Stated Goal: to go home  PT Goal Formulation: With patient/family Time For Goal Achievement: 01/28/18 Potential to Achieve Goals: Good Progress towards PT goals: Progressing toward goals    Frequency    7X/week      PT Plan Current plan remains appropriate    Co-evaluation              AM-PAC PT "6  Clicks" Daily Activity  Outcome Measure  Difficulty turning over in bed (including adjusting bedclothes, sheets and blankets)?: None Difficulty moving from lying on back to sitting on the side of the bed? : A Little Difficulty sitting down on and standing up from a chair with arms (e.g., wheelchair, bedside commode, etc,.)?: A Little Help needed moving to and from a bed to chair (including a wheelchair)?: A Little Help needed walking in hospital room?: A Little Help needed climbing 3-5 steps with a railing? : A Little 6 Click Score: 19    End of Session   Activity Tolerance: Patient tolerated treatment well;Patient limited by fatigue Patient left: in chair;with call bell/phone within reach;with family/visitor present Nurse Communication: Mobility status PT Visit Diagnosis: Unsteadiness on feet (R26.81);Other abnormalities of gait and mobility (R26.89);Muscle weakness (generalized) (M62.81) Pain - Right/Left: Left Pain - part of body: Knee     Time: 1131-1155 PT Time Calculation (min) (ACUTE ONLY): 24 min  Charges:  $Therapeutic Activity: 23-37 mins                    G Codes:       2:11 PM, 02-08-2018 Lonell Grandchild, MPT Physical Therapist with [facility] 336 [phone] office [phone] mobile phone

## 2018-01-27 NOTE — Anesthesia Postprocedure Evaluation (Signed)
Anesthesia Post Note  Patient: Danielle Hicks  Procedure(s) Performed: TOTAL KNEE ARTHROPLASTY (Left Knee)  Patient location during evaluation: Nursing Unit Anesthesia Type: Spinal Level of consciousness: awake and alert, oriented and patient cooperative Pain management: pain level controlled Vital Signs Assessment: post-procedure vital signs reviewed and stable Respiratory status: spontaneous breathing Cardiovascular status: stable Postop Assessment: no apparent nausea or vomiting Anesthetic complications: no     Last Vitals:  Vitals:   01/26/18 2052 01/27/18 0500  BP: 125/77 107/67  Pulse: 61 66  Resp: 18 18  Temp: 36.7 C 36.7 C  SpO2: 98% 99%    Last Pain:  Vitals:   01/27/18 0644  TempSrc:   PainSc: 3                  ADAMS, AMY A

## 2018-01-27 NOTE — Progress Notes (Signed)
Patient ID: Danielle Hicks, female   DOB: 05/18/1956, 62 y.o.   MRN: [id number] BP 107/67 (BP Location: Right Arm)   Pulse 66   Temp 98 F (36.7 C) (Oral)   Resp 18   Ht 5\' 1"  (1.549 m)   Wt 168 lb (76.2 kg)   SpO2 99%   BMI 31.74 kg/m   BMP Latest Ref Rng & Units 01/27/2018 01/21/2018 12/01/2017  Glucose 65 - 99 mg/dL 119(H) 127(H) 114(H)  BUN 6 - 20 mg/dL 10 12 9   Creatinine 0.44 - 1.00 mg/dL 0.68 0.99 0.79  BUN/Creat Ratio 6 - 22 (calc) - - NOT APPLICABLE  Sodium 264 - 145 mmol/L 135 138 139  Potassium 3.5 - 5.1 mmol/L 3.8 3.4(L) 4.2  Chloride 101 - 111 mmol/L 102 101 104  CO2 22 - 32 mmol/L 24 25 26   Calcium 8.9 - 10.3 mg/dL 8.5(L) 9.4 9.7   CBC Latest Ref Rng & Units 01/27/2018 01/21/2018 12/01/2017  WBC 4.0 - 10.5 K/uL 9.3 9.0 8.4  Hemoglobin 12.0 - 15.0 g/dL 10.4(L) 11.7(L) 12.6  Hematocrit 36.0 - 46.0 % 32.7(L) 36.3 37.9  Platelets 150 - 400 K/uL 306 325 374   Today are patient is stable with good pain control glucose at 120 currently on a glucose protocol.  Plan is to advance patient with physical therapy and discharge tomorrow

## 2018-01-27 NOTE — Op Note (Signed)
01/26/2018  12:05 PM  PATIENT:  Danielle Hicks  62 y.o. 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  The patella cartilage was pristine.   The patient was identified by 2 approved identification mechanisms. The operative extremity was evaluated and found to be acceptable for surgical treatment today. The chart was reviewed. The surgical site was confirmed and marked.  The patient was taken to the operating room and given appropriate antibiotic vancomycin 1 g secondary to allergy to penicillin. This is consistent with the SCIP protocol.  The patient was given the following anesthetic: Spinal  The patient was then placed supine on the operating table. A Foley catheter was inserted. The operative extremity was prepped and draped sterilely from the toes to the groin.  Timeout procedure was executed confirming the patient's name, surgical site, antibiotic administration, x-rays available, and implants available.  The operative limb,  was exsanguinated with a six-inch Esmarch and the tourniquet was inflated to 300 mmHg.  A straight midline incision was made over the left KNEE and taken down to the extensor mechanism. A medial arthrotomy was performed. The patella was everted and the patellofemoral band was released. The anterior cruciate ligament and PCL were resected.  The anterior horns of the lateral and medial meniscus were resected. The medial soft tissue sleeve was elevated to the mid coronal plane.  A three-eighths inch drill bit was used to enter the femoral canal which was decompressed with  suction and irrigation until clear. The distal femoral cutting guide was set for 11 mm distal resection,  5valgus alignment, for a left knee. The distal femur was resected and checked for flatness.   The external alignment guide for the tibial resection was then applied to the distal and proximal tibia and set for anatomic slope along with 10 MM resection  from the higher lateral side.   Rotational alignment was set using the malleolus, the tibial tubercle and the tibial spines.  The proximal tibia was resected along with  residual menisci. The tibia was sized using a base plate to a size 2.0.   The extension gap was confirmed with a 10 mm spacer block  The Depuy Sigma sizing femoral guide was placed and the femur was sized to a size 2.5. A 4-in-1 cutting block was placed along with collateral ligament retractors and the distal femoral cuts were completed.   Spacer blocks were used to confirm equal flexion extension gaps with releases done as needed. A size 10 MM spacer block gave equal stability and flexion extension.  The correct sized notch cutting guide for the femur was then applied and the notch cut was made.  Trial reduction was completed using 2.5 femoral and 2.0 tibial trial implants. Patella tracking was normal  Patelloplasty was performed but no resection was needed patella thickness was 20 mm  The proximal tibia was prepared using the size 2.0 base plate.  Thorough irrigation was performed and the bone was dried and prepared for cement. The cement was mixed on the back table using third generation preparation techniques  20 cc of dilute Exparel was injected into the soft tissues including  the posterior capsule.  The implants were then cemented in place and excess cement was removed. The cement was allowed to cure. Irrigation was repeated and excess and residual bone fragments and cement were removed.  A medium-size Hemovac was placed in the joint with 10 openings in the  tube left in the joint.  The extensor mechanism was closed with #1 Bralon suture followed by subcutaneous tissue closure using 0 Monocryl suture  30 cc of Marcaine with epinephrine was injected into the joint  Skin approximation was performed using staples  A sterile dressing was applied, followed by a ace wrapped from foot to thigh and then a Cryo/Cuff which was activated   The patient was taken recovery room in stable condition     SURGEON:  Surgeon(s) and Role:    * Carole Civil, MD - Primary  PHYSICIAN ASSISTANT:   ASSISTANTS: betty ashley and cynthia wrenn   ANESTHESIA:   spinal  EBL:  25 mL   BLOOD ADMINISTERED:none  DRAINS: none   LOCAL MEDICATIONS USED:  MARCAINE   , Amount: 30 ml and OTHER exparel 20   SPECIMEN:  No Specimen  DISPOSITION OF SPECIMEN:  N/A  COUNTS:  YES  TOURNIQUET:   Total Tourniquet Time Documented: Thigh (Left) - 86 minutes Total: Thigh (Left) - 86 minutes   DICTATION: .Viviann Spare Dictation  PLAN OF CARE: Admit to inpatient   PATIENT DISPOSITION:  PACU - hemodynamically stable.   Delay start of Pharmacological VTE agent (>24hrs) due to surgical blood loss or risk of bleeding: yes

## 2018-01-27 NOTE — Addendum Note (Signed)
Addendum  created 01/27/18 0859 by Mickel Baas, CRNA   Sign clinical note

## 2018-01-28 LAB — CBC
HCT: 30.7 % — ABNORMAL LOW (ref 36.0–46.0)
Hemoglobin: 10 g/dL — ABNORMAL LOW (ref 12.0–15.0)
MCH: 26 pg (ref 26.0–34.0)
MCHC: 32.6 g/dL (ref 30.0–36.0)
MCV: 79.9 fL (ref 78.0–100.0)
Platelets: 289 10*3/uL (ref 150–400)
RBC: 3.84 MIL/uL — ABNORMAL LOW (ref 3.87–5.11)
RDW: 16.7 % — ABNORMAL HIGH (ref 11.5–15.5)
WBC: 10.8 10*3/uL — ABNORMAL HIGH (ref 4.0–10.5)

## 2018-01-28 LAB — GLUCOSE, CAPILLARY
Glucose-Capillary: 135 mg/dL — ABNORMAL HIGH (ref 65–99)
Glucose-Capillary: 159 mg/dL — ABNORMAL HIGH (ref 65–99)

## 2018-01-28 MED ORDER — HYDROCODONE-ACETAMINOPHEN 10-325 MG PO TABS: 1.0000 | ORAL_TABLET | ORAL | 0 refills | Status: DC | PRN

## 2018-01-28 MED ORDER — DOCUSATE SODIUM 100 MG PO CAPS: 100.0000 mg | ORAL_CAPSULE | Freq: Two times a day (BID) | ORAL | 0 refills | Status: DC

## 2018-01-28 MED ORDER — ASPIRIN 325 MG PO TBEC: 325.0000 mg | DELAYED_RELEASE_TABLET | Freq: Every day | ORAL | 0 refills | Status: DC

## 2018-01-28 NOTE — Care Management Note (Signed)
Case Management Note  Patient Details  Name: Danielle Hicks MRN: [id number] Date of Birth: [birth year]  Subjective/Objective:              S/p TKR. Pt from home with husband. ind with ADL's PTA. Pt will need HH PT, RW and CPM. MD office has already sent referrals to HH/DME companies pta.      Action/Plan: Shelby will deliver DME to pt room today prior to DC. Tim, [facility] rep, aware of DC today and pt will be scheduled to be seen tomorrow. Pt communicates no further needs or concerns.   Expected Discharge Date:    01/28/18              Expected Discharge Plan:  [place]  In-House Referral:  NA  Discharge planning Services  CM Consult  Post Acute Care Choice:  Home Health, Durable Medical Equipment Choice offered to:  Patient  DME Arranged:  [place] rolling DME Agency:  [place]:  PT [place]:  [facility]  Status of Service:  Completed, signed off  If discussed at [employer], dates discussed:    Additional Comments:  Sherald Barge, RN 01/28/2018, 10:54 AM

## 2018-01-28 NOTE — Discharge Summary (Signed)
Physician Discharge Summary  Patient ID: Danielle Hicks MRN: [id number] DOB/AGE: 1956-06-16 62 y.o.  Admit date: 01/26/2018 Discharge date: 01/28/2018  Admission Diagnoses: LEFT KNEE OA   Discharge Diagnoses: SAME   Discharged Condition: stable  Procedure: LEFT TKA DEPUY SIGMA FB PS   Hospital Course: NORMAL     LEFT KNEE ROM:  2-90 degrees AMBULATION DISTANCE:  75 feet using RW with Supervision    CBC Latest Ref Rng & Units 01/28/2018 01/27/2018 01/21/2018  WBC 4.0 - 10.5 K/uL 10.8(H) 9.3 9.0  Hemoglobin 12.0 - 15.0 g/dL 10.0(L) 10.4(L) 11.7(L)  Hematocrit 36.0 - 46.0 % 30.7(L) 32.7(L) 36.3  Platelets 150 - 400 K/uL 289 306 325   BMP Latest Ref Rng & Units 01/27/2018 01/21/2018 12/01/2017  Glucose 65 - 99 mg/dL 119(H) 127(H) 114(H)  BUN 6 - 20 mg/dL 10 12 9   Creatinine 0.44 - 1.00 mg/dL 0.68 0.99 0.79  BUN/Creat Ratio 6 - 22 (calc) - - NOT APPLICABLE  Sodium 353 - 145 mmol/L 135 138 139  Potassium 3.5 - 5.1 mmol/L 3.8 3.4(L) 4.2  Chloride 101 - 111 mmol/L 102 101 104  CO2 22 - 32 mmol/L 24 25 26   Calcium 8.9 - 10.3 mg/dL 8.5(L) 9.4 9.7       Discharge Exam: Blood pressure (!) 144/82, pulse 72, temperature 98.8 F (37.1 C), temperature source Oral, resp. rate 18, height 5\' 1"  (1.549 m), weight 168 lb (76.2 kg), SpO2 99 %.   Disposition: Discharge disposition: 01-Home or Self Care       Discharge Instructions    CPM   Complete by:  As directed    Continuous passive motion machine (CPM):      Use the CPM from 0 to 75 for 8 hours per day.      You may increase by 10 per day.  You may break it up into 2 or 3 sessions per day.      Use CPM for 2 weeks or until you are told to stop.   Call MD / Call 911   Complete by:  As directed    If you experience chest pain or shortness of breath, CALL 911 and be transported to the hospital emergency room.  If you develope a fever above 101 F, pus (white drainage) or increased drainage or redness at the wound, or calf pain,  call your surgeon's office.   Change dressing   Complete by:  As directed    DO NOT Change dressing   Constipation Prevention   Complete by:  As directed    Drink plenty of fluids.  Prune juice may be helpful.  You may use a stool softener, such as Colace (over the counter) 100 mg twice a day.  Use MiraLax (over the counter) for constipation as needed.   Diet - low sodium heart healthy   Complete by:  As directed    Discharge instructions   Complete by:  As directed    USE BONE FOAM 3 X A DAY FOR 1 HOUR   Do not put a pillow under the knee. Place it under the heel.   Complete by:  As directed    Increase activity slowly as tolerated   Complete by:  As directed      Allergies as of 01/28/2018      Reactions   Penicillins Other (See Comments)   Gave her a rash as a child Has patient had a PCN reaction causing immediate rash, facial/tongue/throat swelling, SOB or lightheadedness  with hypotension: Unknown Has patient had a PCN reaction causing severe rash involving mucus membranes or skin necrosis: Unknown Has patient had a PCN reaction that required hospitalization: Unknown Has patient had a PCN reaction occurring within the last 10 years: No If all of the above answers are "NO", then may proceed with Cephalosporin use.      Medication List    STOP taking these medications   HYDROcodone-acetaminophen 5-325 MG tablet Commonly known as:  NORCO/VICODIN Replaced by:  HYDROcodone-acetaminophen 10-325 MG tablet   ibuprofen 200 MG tablet Commonly known as:  ADVIL,MOTRIN     TAKE these medications   amLODipine 10 MG tablet Commonly known as:  NORVASC take 1 tablet by mouth once daily What changed:    how much to take  how to take this  when to take this   aspirin 325 MG EC tablet Take 1 tablet (325 mg total) by mouth daily with breakfast. Start taking on:  01/29/2018   docusate sodium 100 MG capsule Commonly known as:  COLACE Take 1 capsule (100 mg total) by mouth 2 (two)  times daily.   HYDROcodone-acetaminophen 10-325 MG tablet Commonly known as:  NORCO Take 1 tablet by mouth every 4 (four) hours as needed. Replaces:  HYDROcodone-acetaminophen 5-325 MG tablet   lisinopril-hydrochlorothiazide 10-12.5 MG tablet Commonly known as:  PRINZIDE,ZESTORETIC take 1 tablet by mouth once daily   metFORMIN 500 MG tablet Commonly known as:  GLUCOPHAGE Take 1 tablet (500 mg total) by mouth 2 (two) times daily with a meal.   ONE-A-DAY WOMENS FORMULA PO Take 1 tablet by mouth daily.   pantoprazole 40 MG tablet Commonly known as:  PROTONIX Take 1 tablet (40 mg total) by mouth daily.   potassium chloride SA 20 MEQ tablet Commonly known as:  K-DUR,KLOR-CON Take 1 tablet (20 mEq total) by mouth 3 (three) times daily.   trolamine salicylate 10 % cream Commonly known as:  ASPERCREME Apply 1 application topically as needed for muscle pain.            Durable Medical Equipment  (From admission, onward)        Start     Ordered   01/26/18 1440  For home use only DME Walker rolling  Once    Question:  Patient needs a walker to treat with the following condition  Answer:  S/P TKR (total knee replacement)   01/26/18 1439       Discharge Care Instructions  (From admission, onward)        Start     Ordered   01/28/18 0000  Change dressing    Comments:  DO NOT Change dressing   01/28/18 1306     Follow-up Information    Home, [facility].   Specialty:  [facility] Contact information: [address] [place] [address] [phone]           Signed: Arther Abbott 01/28/2018, 1:06 PM

## 2018-01-28 NOTE — Progress Notes (Signed)
Physical Therapy Treatment Patient Details Name: Danielle Hicks MRN: [id number] DOB: 14-Sep-1956 Today's Date: 01/28/2018   LEFT KNEE ROM:  0-95 degrees AMBULATION DISTANCE:  120 feet using RW with Mod Indep     History of Present Illness Danielle Hicks is a 62 y/o femlae s/p Left TKA 01/26/17 with hx of dull aching medial left knee pain now for several months associated with swelling decreased range of motion trouble getting in a chair    PT Comments    Patient demonstrates increased endurance/distance for gait training without loss of balance with good return for left heel to toe stepping, reviewed in HEP with written instructions provided and understanding acknowledged.  Patient will benefit from continued physical therapy in hospital and recommended venue below to increase strength, balance, endurance for safe ADLs and gait.   Follow Up Recommendations  Home health PT;Supervision - Intermittent     Equipment Recommendations  Rolling walker with 5" wheels    Recommendations for Other Services       Precautions / Restrictions Precautions Precautions: Fall Precaution Comments: s/p Left TKA Restrictions Weight Bearing Restrictions: Yes LLE Weight Bearing: Weight bearing as tolerated Other Position/Activity Restrictions: WBAT LLE, no pillows underneath left knee    Mobility  Bed Mobility Overal bed mobility: Independent                Transfers Overall transfer level: Modified independent                  Ambulation/Gait Ambulation/Gait assistance: Modified independent (Device/Increase time) Ambulation Distance (Feet): 120 Feet Assistive device: Rolling walker (2 wheeled) Gait Pattern/deviations: Decreased step length - left;Decreased stance time - left;Decreased stride length   Gait velocity interpretation: Below normal speed for age/gender General Gait Details: demonstrates good return for left heel to toe stepping without loss of balance,  increased left knee extension during heel strike phase    Stairs            Wheelchair Mobility    Modified Rankin (Stroke Patients Only)       Balance Overall balance assessment: Mild deficits observed, not formally tested                                          Cognition Arousal/Alertness: Awake/alert Behavior During Therapy: WFL for tasks assessed/performed Overall Cognitive Status: Within Functional Limits for tasks assessed                                        Exercises Total Joint Exercises Ankle Circles/Pumps: Supine;AROM;Strengthening;Left;10 reps Quad Sets: Supine;AROM;Strengthening;Left;5 reps Short Arc Quad: Supine;AROM;Strengthening;Left;5 reps Heel Slides: Supine;AROM;Strengthening;Left;5 reps Knee Flexion: Seated;PROM(self left flexion using RLE x 3 sets of 30 sec holds) Goniometric ROM: LEFT KNEE: 0-95 degrees    General Comments        Pertinent Vitals/Pain Pain Assessment: 0-10 Pain Score: 3  Pain Location: left knee Pain Descriptors / Indicators: Aching;Discomfort    Home Living                      Prior Function            PT Goals (current goals can now be found in the care plan section) Acute Rehab PT Goals PT Goal Formulation: With patient/family Time For Goal Achievement:  02/13/18 Potential to Achieve Goals: Good Progress towards PT goals: Progressing toward goals    Frequency    7X/week      PT Plan Current plan remains appropriate    Co-evaluation              AM-PAC PT "6 Clicks" Daily Activity  Outcome Measure  Difficulty turning over in bed (including adjusting bedclothes, sheets and blankets)?: None Difficulty moving from lying on back to sitting on the side of the bed? : None Difficulty sitting down on and standing up from a chair with arms (e.g., wheelchair, bedside commode, etc,.)?: None Help needed moving to and from a bed to chair (including a  wheelchair)?: None Help needed walking in hospital room?: None Help needed climbing 3-5 steps with a railing? : A Little 6 Click Score: 23    End of Session   Activity Tolerance: Patient tolerated treatment well Patient left: in bed;with call bell/phone within reach(seated at bedside) Nurse Communication: Mobility status PT Visit Diagnosis: Unsteadiness on feet (R26.81);Other abnormalities of gait and mobility (R26.89);Muscle weakness (generalized) (M62.81) Pain - Right/Left: Left Pain - part of body: Knee     Time: 1420-1443 PT Time Calculation (min) (ACUTE ONLY): 23 min  Charges:  $Therapeutic Activity: 23-37 mins                    G Codes:       3:06 PM, 2018/02/13 Danielle Hicks, MPT Physical Therapist with [facility] [phone] office [phone] mobile phone

## 2018-01-29 LAB — BPAM RBC
Blood Product Expiration Date: 201904052359
Blood Product Expiration Date: 201904092359
Unit Type and Rh: 5100
Unit Type and Rh: 5100

## 2018-01-29 LAB — TYPE AND SCREEN
ABO/RH(D): O POS
Antibody Screen: NEGATIVE
Unit division: 0
Unit division: 0

## 2018-02-01 ENCOUNTER — Telehealth: Payer: Self-pay | Admitting: Orthopedic Surgery

## 2018-02-01 NOTE — Telephone Encounter (Signed)
Call received from home care physical therapist Weyman Rodney at [facility] - PH# [phone]; requests verbal orders for home therapy 3x a week for 2 weeks.

## 2018-02-01 NOTE — Telephone Encounter (Signed)
I called to give verbal order, the written orders were previously faxed.

## 2018-02-05 ENCOUNTER — Telehealth: Payer: Self-pay | Admitting: Orthopedic Surgery

## 2018-02-05 ENCOUNTER — Other Ambulatory Visit: Payer: Self-pay | Admitting: Orthopedic Surgery

## 2018-02-05 NOTE — Telephone Encounter (Signed)
Patient called to ask if the "numbness" that she is feeling in her left knee, post operative total knee surgery 01/26/18 is normal. She is awRe of scheduled appointment 02/10/18.

## 2018-02-06 ENCOUNTER — Other Ambulatory Visit: Payer: Self-pay | Admitting: Orthopedic Surgery

## 2018-02-08 NOTE — Telephone Encounter (Signed)
I talked to her on Friday offered reassurance, she has some numbness at the incision. Explained this is expected, may improve, may remain numb, she will discuss with you at follow up  To you FYI

## 2018-02-09 ENCOUNTER — Other Ambulatory Visit: Payer: Self-pay | Admitting: Orthopedic Surgery

## 2018-02-09 ENCOUNTER — Telehealth: Payer: Self-pay | Admitting: Radiology

## 2018-02-09 DIAGNOSIS — Z96652 Presence of left artificial knee joint: Secondary | ICD-10-CM | POA: Insufficient documentation

## 2018-02-09 DIAGNOSIS — Z96659 Presence of unspecified artificial knee joint: Secondary | ICD-10-CM

## 2018-02-09 MED ORDER — HYDROCODONE-ACETAMINOPHEN 7.5-325 MG PO TABS: 1.0000 | ORAL_TABLET | Freq: Four times a day (QID) | ORAL | 0 refills | Status: DC | PRN

## 2018-02-09 NOTE — Telephone Encounter (Signed)
Patient has called about her meds, her my chart request appears to have been lost in the system I put in a ticket. Dr Aline Brochure refilled it today. I sent patient a my chart message to advise.

## 2018-02-09 NOTE — Telephone Encounter (Signed)
Done

## 2018-02-10 ENCOUNTER — Encounter: Payer: Self-pay | Admitting: Orthopedic Surgery

## 2018-02-10 ENCOUNTER — Ambulatory Visit (INDEPENDENT_AMBULATORY_CARE_PROVIDER_SITE_OTHER): Payer: Self-pay | Admitting: Orthopedic Surgery

## 2018-02-10 DIAGNOSIS — Z96652 Presence of left artificial knee joint: Secondary | ICD-10-CM

## 2018-02-10 NOTE — Progress Notes (Signed)
POSTOP VISIT BP 130/82   Pulse 84   Ht 5\' 1"  (1.549 m)   Wt 168 lb (76.2 kg)   BMI 31.74 kg/m   Encounter Diagnosis  Name Primary?  . S/P total knee replacement, left 01/26/18    No complaints  Range of motion passive 0-1 12 active 105  Ambulation with a cane  The surgical site incision clean dry and intact with no drainage  The neurovascular examination of the extremity is normal in terms of sensation pulse and perfusion  Postoperative plan  Outpatient therapy scale Street return 1 month

## 2018-02-11 ENCOUNTER — Encounter: Payer: Self-pay | Admitting: Orthopedic Surgery

## 2018-02-16 ENCOUNTER — Ambulatory Visit (HOSPITAL_COMMUNITY): Payer: BLUE CROSS/BLUE SHIELD | Attending: Orthopedic Surgery | Admitting: Physical Therapy

## 2018-02-16 ENCOUNTER — Other Ambulatory Visit: Payer: Self-pay

## 2018-02-16 ENCOUNTER — Encounter (HOSPITAL_COMMUNITY): Payer: Self-pay | Admitting: Physical Therapy

## 2018-02-16 DIAGNOSIS — M25662 Stiffness of left knee, not elsewhere classified: Secondary | ICD-10-CM

## 2018-02-16 DIAGNOSIS — R262 Difficulty in walking, not elsewhere classified: Secondary | ICD-10-CM | POA: Diagnosis present

## 2018-02-16 DIAGNOSIS — M25562 Pain in left knee: Secondary | ICD-10-CM | POA: Diagnosis present

## 2018-02-16 NOTE — Patient Instructions (Addendum)
Access Code: [email or web address]  URL: [email or web address]  Date: 02/16/2018  Prepared by: Rayetta Humphrey   Exercises  Supine Quad Set - 10 reps - 3 sets - 5 hold - 2x daily - 7x weekly  Supine Heel Slide - 10 reps - 1 sets - 3-5 hold - 3x daily - 7x weekly  Mini Squat with Counter Support - 10 reps - 1 sets - 3-5 hold - 3x daily - 7x weekly  Standing Heel Raise - 10 reps - 1 sets - 3 hold - 3x daily - 7x weekly  Access Code: [email or web address]  URL: [email or web address]  Date: 02/16/2018  Prepared by: Rayetta Humphrey   Exercises  Sidelying Hip Abduction - 10 reps - 1 sets - 3-5 hold - 3x daily - 7x weekly

## 2018-02-16 NOTE — Therapy (Signed)
[place], [place], [address] Phone: [phone]   Fax:  [phone]  Physical Therapy Evaluation  Patient Details  Name: Danielle Hicks MRN: [id number] Date of Birth: 1963-09-07 Referring Provider: Arther Abbott    ROM 9-1o5 Ambulating with cane for 5 minutes.   Encounter Date: 02/16/2018  PT End of Session - 02/16/18 1603    Visit Number  1    Number of Visits  16    Date for PT Re-Evaluation  03/18/18    Authorization Type  BC[employer]    PT Start Time  1520    PT Stop Time  1600    PT Time Calculation (min)  40 min    Activity Tolerance  Patient tolerated treatment well    Behavior During Therapy  WFL for tasks assessed/performed       Past Medical History:  Diagnosis Date  . Arthritis   . GERD (gastroesophageal reflux disease)   . Headache   . Hypertension   . Type 2 diabetes mellitus ([place])     Past Surgical History:  Procedure Laterality Date  . ABDOMINAL HYSTERECTOMY    . BLEPHAROPLASTY Bilateral   . HEMORRHOID SURGERY    . TOTAL KNEE ARTHROPLASTY Left 01/26/2018   Procedure: TOTAL KNEE ARTHROPLASTY;  Surgeon: Carole Civil, MD;  Location: [facility];  Service: Orthopedics;  Laterality: Left;    There were no vitals filed for this visit.   Subjective Assessment - 02/16/18 1407    Subjective  Ms. Kulik states that she had her left TKR on 01/26/2018.  She had home health which ended on 02/12/2018.    Pertinent History  DM, HTN    How long can you sit comfortably?  15 minutes and then she wants to move it around    How long can you stand comfortably?  10 minutes     How long can you walk comfortably?  walks with a cane for less five minutes     Patient Stated Goals  To get back walking; get back to sleeping better.     Currently in Pain?  Yes    Pain Score  3  worst in the past week has been a 10/10     Pain Location  Knee    Pain Orientation  Left    Pain Descriptors / Indicators   Aching    Pain Type  Acute pain    Pain Onset  1 to 4 weeks ago    Aggravating Factors   activity     Pain Relieving Factors  medication     Effect of Pain on Daily Activities  limits     Multiple Pain Sites  No         [facility] PT Assessment - 02/16/18 0001      Assessment   Medical Diagnosis  LT TKR     Referring Provider  Arther Abbott     Onset Date/Surgical Date  01/26/18    Next MD Visit  03/10/2018    Prior Therapy  acute, HH      Precautions   Precautions  None      Restrictions   Weight Bearing Restrictions  No      Balance Screen   Has the patient fallen in the past 6 months  No    Has the patient had a decrease in activity level because of a fear of falling?   Yes    Is the patient reluctant  to leave their home because of a fear of falling?   No      Home Film/video editor residence      Prior Function   Level of Independence  Independent    Vocation  Full time employment    Vocation Requirements  PT needs to go up and down 4 steps about 4 times a day; Sitting and Standing     Leisure  walk, biking and traveling       Cognition   Overall Cognitive Status  Within Functional Limits for tasks assessed      Observation/Other Assessments   Focus on Therapeutic Outcomes (FOTO)   58      Functional Tests   Functional tests  Single leg stance;Sit to Stand      Single Leg Stance   Comments  RT:  18" LT 10.51      Sit to Stand   Comments  5x 11.71      ROM / Strength   AROM / PROM / Strength  AROM;Strength      AROM   AROM Assessment Site  Knee    Right/Left Knee  Left    Left Knee Extension  9    Left Knee Flexion  105      Strength   Strength Assessment Site  Hip;Knee;Ankle    Right/Left Hip  Right;Left    Right Hip Extension  3/5    Left Hip Flexion  5/5    Left Hip Extension  3/5    Left Hip ABduction  3+/5    Right/Left Knee  Right;Left    Right Knee Extension  3+/5    Left Knee Flexion  4+/5    Right/Left Ankle   Right;Left    Right Ankle Dorsiflexion  5/5    Left Ankle Dorsiflexion  4/5      Ambulation/Gait   Ambulation Distance (Feet)  502 Feet    Assistive device  Straight cane    Gait Comments  3'                Objective measurements completed on examination: See above findings.      [place] Adult PT Treatment/Exercise - 02/16/18 0001      Exercises   Exercises  Knee/Hip      Knee/Hip Exercises: Standing   Heel Raises  Both;5 reps    Functional Squat  5 reps      Knee/Hip Exercises: Seated   Long Arc Quad  Left;5 reps      Knee/Hip Exercises: Supine   Quad Sets  Left;5 reps    Heel Slides  Left;5 reps      Knee/Hip Exercises: Sidelying   Hip ABduction  Left;5 reps             PT Education - 02/16/18 1418    Education provided  Yes    Education Details  HEP    Person(s) Educated  Patient    Methods  Explanation;Handout    Comprehension  Verbalized understanding;Returned demonstration       PT Short Term Goals - 02/16/18 1610      PT SHORT TERM GOAL #1   Title  Pain in pt Lt knee of be no greater than a 6/10 to allow pt to sleep for four hours straight.     Time  3    Period  Weeks    Status  New    Target Date  03/09/18  PT SHORT TERM GOAL #2   Title  Pt to be able to single leg stance on both LE for 15 " to allow pt to feel confident walking without a cane.     Time  3    Period  Weeks    Status  New      PT SHORT TERM GOAL #3   Title  Pt to be able to stand for 15 mintues to prepare a small meal.     Time  3    Period  Weeks    Status  New      PT SHORT TERM GOAL #4   Title  PT to be able to walk for 20 minutes without increased pain to complete short shopping trips.     Time  3    Period  Weeks    Status  New        PT Long Term Goals - 02/16/18 1612      PT LONG TERM GOAL #1   Title  PT pain to be no greater than a 3/10 in her left knee to allow pt to sleep for 6 hours without difficulty.     Time  6    Period  Weeks     Status  New    Target Date  03/30/18      PT LONG TERM GOAL #2   Title  Pt to be able to stand for over 20 mintues without having increased Lt knee pain to be able to make a meal.     Time  6    Period  Weeks    Status  New      PT LONG TERM GOAL #3   Title  Pt to be able to walk for 40 minutes without increased Lt knee pain to complete shopping trips.     Time  6    Status  New      PT LONG TERM GOAL #4   Title  Pt Lt LE mm to increase to at least a 4+/5 to be able to go up and down steps in a reciprocal manner.     Time  6    Period  Weeks    Status  New      PT LONG TERM GOAL #5   Title  PT to be able to single leg stance for at least 40 seconds to allow pt to feel comfortable walking on uneven ground without her cane.     Time  6    Period  Weeks      Additional Long Term Goals   Additional Long Term Goals  Yes      PT LONG TERM GOAL #6   Title  PT Lt knee ROM to be below 4 degrees for extension and at least 120 degrees to allow pt to walk with a normal gait and squat without difficutty.     Time  6    Period  Weeks    Status  New             Plan - 02/16/18 1604    Clinical Impression Statement  Ms. Venard is a 62 yo female who underwent a left TKR on 01/26/2018.  She has been referred to skilled physical therapy to improve her functional mobility.  Evaluation demonstrates decreased ROM, decreased strength , decreased activity toleance, decreased balance,increased pain and difficulty in walking.  Ms. Riedinger will benefit from skilled physical therapy to address these issues and  maximize her functional ability.     History and Personal Factors relevant to plan of care:  HTN, CM    Clinical Presentation  Stable    Clinical Decision Making  Low    Rehab Potential  Good    PT Frequency  3x / week    PT Duration  6 weeks    PT Treatment/Interventions  Cryotherapy;Gait training;Stair training;Functional mobility training;Therapeutic activities;Therapeutic  exercise;Balance training;Neuromuscular re-education;Patient/family education;Manual techniques    PT Next Visit Plan  begin rockerboard, SLS, standing and supine terminal extension, active hamstring stretch . Progress to gluteal maximus and medius strengthening.     PT Home Exercise Plan  heel raises; functional squat, LAQ, Qset, heel slide and sidelying abduction        Patient will benefit from skilled therapeutic intervention in order to improve the following deficits and impairments:  Decreased activity tolerance, Decreased balance, Decreased range of motion, Difficulty walking, Decreased strength, Pain  Visit Diagnosis: Stiffness of left knee, not elsewhere classified - Plan: PT plan of care cert/re-cert  Acute pain of left knee - Plan: PT plan of care cert/re-cert  Difficulty in walking, not elsewhere classified - Plan: PT plan of care cert/re-cert     Problem List Patient Active Problem List   Diagnosis Date Noted  . S/P total knee replacement, left 01/26/18 02/09/2018  . Primary osteoarthritis of left knee   . DDD (degenerative disc disease), cervical 01/06/2018  . Dyshidrotic eczema 08/20/2015  . Intertrigo 01/31/2015  . GERD (gastroesophageal reflux disease) 01/31/2015  . Thoracic back pain 12/13/2013  . Tinea pedis 07/06/2013  . Leg cramps 01/07/2013  . Major depressive disorder, recurrent episode, unspecified 01/02/2013  . Controlled type 2 diabetes mellitus without complication, without long-term current use of insulin ([place]) 10/04/2012  . Essential hypertension, benign 10/04/2012  . Tobacco use 10/04/2012    Rayetta Humphrey, PT CLT [phone] 02/16/2018, 4:24 PM  [facility] [address] [place], [place], [address] Phone: [phone]   Fax:  [phone]  Name: JAZSMINE MACARI MRN: [id number] Date of Birth: 1956-07-03

## 2018-02-17 ENCOUNTER — Encounter (HOSPITAL_COMMUNITY): Payer: Self-pay

## 2018-02-17 ENCOUNTER — Ambulatory Visit (HOSPITAL_COMMUNITY): Payer: BLUE CROSS/BLUE SHIELD

## 2018-02-17 ENCOUNTER — Other Ambulatory Visit: Payer: Self-pay | Admitting: Orthopedic Surgery

## 2018-02-17 DIAGNOSIS — R262 Difficulty in walking, not elsewhere classified: Secondary | ICD-10-CM

## 2018-02-17 DIAGNOSIS — M25562 Pain in left knee: Secondary | ICD-10-CM

## 2018-02-17 DIAGNOSIS — Z96659 Presence of unspecified artificial knee joint: Secondary | ICD-10-CM

## 2018-02-17 DIAGNOSIS — M25662 Stiffness of left knee, not elsewhere classified: Secondary | ICD-10-CM

## 2018-02-17 MED ORDER — HYDROCODONE-ACETAMINOPHEN 5-325 MG PO TABS
1.0000 | ORAL_TABLET | Freq: Four times a day (QID) | ORAL | 0 refills | Status: DC | PRN
Start: 2018-02-17 — End: 2018-02-25

## 2018-02-17 NOTE — Telephone Encounter (Signed)
Hydrocodone-Acetaminophen 7.5/325 mg  Qty 28 Tablets  Take 1 tablet by mouth every 6 (six) hours as needed for moderate pain.  PATIENT USES [facility] IN [place]

## 2018-02-17 NOTE — Telephone Encounter (Signed)
Dose changed

## 2018-02-17 NOTE — Therapy (Signed)
[facility] [address] [place], [place], [address] Phone: [phone]   Fax:  [phone]  Physical Therapy Treatment  Patient Details  Name: Danielle Hicks MRN: [id number] Date of Birth: 20-Apr-1956 Referring Provider: Arther Abbott    Encounter Date: 02/17/2018  PT End of Session - 02/17/18 1051    Visit Number  2    Number of Visits  16    Date for PT Re-Evaluation  03/18/18 minireassess 03/09/18    Authorization Type  BCBS    Authorization Time Period  cert: 8/1-->4/48/18    Authorization - Visit Number  2    Authorization - Number of Visits  10    PT Start Time  1035    PT Stop Time  1115    PT Time Calculation (min)  40 min    Activity Tolerance  Patient tolerated treatment well    Behavior During Therapy  [facility] for tasks assessed/performed       Past Medical History:  Diagnosis Date  . Arthritis   . GERD (gastroesophageal reflux disease)   . Headache   . Hypertension   . Type 2 diabetes mellitus ([place])     Past Surgical History:  Procedure Laterality Date  . ABDOMINAL HYSTERECTOMY    . BLEPHAROPLASTY Bilateral   . HEMORRHOID SURGERY    . TOTAL KNEE ARTHROPLASTY Left 01/26/2018   Procedure: TOTAL KNEE ARTHROPLASTY;  Surgeon: Carole Civil, MD;  Location: [facility];  Service: Orthopedics;  Laterality: Left;    There were no vitals filed for this visit.  Subjective Assessment - 02/17/18 1040    Subjective  Pt stated she has new pain on anterior surface of knee, pain scale 5/10 sore, achey and tender.      Patient Stated Goals  To get back walking; get back to sleeping better.     Currently in Pain?  Yes    Pain Score  5     Pain Location  Knee    Pain Orientation  Left;Anterior;Medial    Pain Descriptors / Indicators  Aching    Pain Type  Acute pain    Pain Onset  1 to 4 weeks ago    Pain Frequency  Intermittent    Aggravating Factors   activity    Pain Relieving Factors  medication, ice    Effect of  Pain on Daily Activities  limits                        [facility] Adult PT Treatment/Exercise - 02/17/18 0001      Knee/Hip Exercises: Stretches   Active Hamstring Stretch  2 reps;30 seconds Supine      Knee/Hip Exercises: Standing   Heel Raises  10 reps    Functional Squat  10 reps chair behind for mechanics    Rocker Board  2 minutes    SLS  next session    Gait Training  heel to toe mechanics x 226 ft      Knee/Hip Exercises: Seated   Long Arc Quad  10 reps      Knee/Hip Exercises: Supine   Quad Sets  10 reps    Short Arc Quad Sets  10 reps    Heel Slides  10 reps    Knee Extension  AROM;Limitations    Knee Extension Limitations  9    Knee Flexion  AROM;Limitations    Knee Flexion Limitations  105  Knee/Hip Exercises: Sidelying   Hip ABduction  10 reps             PT Education - 02/17/18 1046    Education provided  Yes    Education Details  Reviewed goals, copy of eval given to pt.  Reviewed compliance and importance of HEP, tactile and verbal cueing to improve mechanics with exerciser    Person(s) Educated  Patient    Methods  Explanation;Demonstration;Handout    Comprehension  Verbalized understanding;Returned demonstration;Need further instruction       PT Short Term Goals - 02/16/18 1610      PT SHORT TERM GOAL #1   Title  Pain in pt Lt knee of be no greater than a 6/10 to allow pt to sleep for four hours straight.     Time  3    Period  Weeks    Status  New    Target Date  03/09/18      PT SHORT TERM GOAL #2   Title  Pt to be able to single leg stance on both LE for 15 " to allow pt to feel confident walking without a cane.     Time  3    Period  Weeks    Status  New      PT SHORT TERM GOAL #3   Title  Pt to be able to stand for 15 mintues to prepare a small meal.     Time  3    Period  Weeks    Status  New      PT SHORT TERM GOAL #4   Title  PT to be able to walk for 20 minutes without increased pain to complete short  shopping trips.     Time  3    Period  Weeks    Status  New        PT Long Term Goals - 02/16/18 1612      PT LONG TERM GOAL #1   Title  PT pain to be no greater than a 3/10 in her left knee to allow pt to sleep for 6 hours without difficulty.     Time  6    Period  Weeks    Status  New    Target Date  03/30/18      PT LONG TERM GOAL #2   Title  Pt to be able to stand for over 20 mintues without having increased Lt knee pain to be able to make a meal.     Time  6    Period  Weeks    Status  New      PT LONG TERM GOAL #3   Title  Pt to be able to walk for 40 minutes without increased Lt knee pain to complete shopping trips.     Time  6    Status  New      PT LONG TERM GOAL #4   Title  Pt Lt LE mm to increase to at least a 4+/5 to be able to go up and down steps in a reciprocal manner.     Time  6    Period  Weeks    Status  New      PT LONG TERM GOAL #5   Title  PT to be able to single leg stance for at least 40 seconds to allow pt to feel comfortable walking on uneven ground without her cane.     Time  6  Period  Weeks      Additional Long Term Goals   Additional Long Term Goals  Yes      PT LONG TERM GOAL #6   Title  PT Lt knee ROM to be below 4 degrees for extension and at least 120 degrees to allow pt to walk with a normal gait and squat without difficutty.     Time  6    Period  Weeks    Status  New            Plan - 02/17/18 1301    Clinical Impression Statement  Reviewed goals and copy of eval given to pt.  Reviewed compliance with HEP and educated importance of adherence/frequency for maximal benefits.  Min cueing for quad activation and tactile/verbal cueing to relax gluteals during quad sets.  Added TKE exercises to improve extension.  Min cueing for mechanics with squats as well to reduce strain on anterior knee.  AROM 9-105 degrees at EOS.  Pt reports pain reduced following therex.  Reviewed RICE techniques to assist with pain and edema control  following therapy.      Rehab Potential  Good    PT Frequency  3x / week    PT Duration  6 weeks    PT Treatment/Interventions  Cryotherapy;Gait training;Stair training;Functional mobility training;Therapeutic activities;Therapeutic exercise;Balance training;Neuromuscular re-education;Patient/family education;Manual techniques    PT Next Visit Plan  Add SLS and continue knee mobiltiy exercises and quad strengthening, and hamstring stretches standing next session.  Progress to gluteal maximus and medius strengthening.      PT Home Exercise Plan  heel raises; functional squat, LAQ, Qset, heel slide and sidelying abduction        Patient will benefit from skilled therapeutic intervention in order to improve the following deficits and impairments:  Decreased activity tolerance, Decreased balance, Decreased range of motion, Difficulty walking, Decreased strength, Pain  Visit Diagnosis: Stiffness of left knee, not elsewhere classified  Acute pain of left knee  Difficulty in walking, not elsewhere classified     Problem List Patient Active Problem List   Diagnosis Date Noted  . S/P total knee replacement, left 01/26/18 02/09/2018  . Primary osteoarthritis of left knee   . DDD (degenerative disc disease), cervical 01/06/2018  . Dyshidrotic eczema 08/20/2015  . Intertrigo 01/31/2015  . GERD (gastroesophageal reflux disease) 01/31/2015  . Thoracic back pain 12/13/2013  . Tinea pedis 07/06/2013  . Leg cramps 01/07/2013  . Major depressive disorder, recurrent episode, unspecified 01/02/2013  . Controlled type 2 diabetes mellitus without complication, without long-term current use of insulin ([place]) 10/04/2012  . Essential hypertension, benign 10/04/2012  . Tobacco use 10/04/2012   Ihor Austin, [place]; [place]  Aldona Lento 02/17/2018, 1:07 PM  [facility] [address] [place], [place], [address] Phone: [phone]   Fax:   [phone]  Name: Danielle Hicks MRN: [id number] Date of Birth: [birth year]

## 2018-02-17 NOTE — Telephone Encounter (Signed)
Noted thanks °

## 2018-02-19 ENCOUNTER — Encounter (HOSPITAL_COMMUNITY): Payer: Self-pay

## 2018-02-19 ENCOUNTER — Ambulatory Visit (HOSPITAL_COMMUNITY): Payer: BLUE CROSS/BLUE SHIELD

## 2018-02-19 DIAGNOSIS — M25562 Pain in left knee: Secondary | ICD-10-CM

## 2018-02-19 DIAGNOSIS — R262 Difficulty in walking, not elsewhere classified: Secondary | ICD-10-CM

## 2018-02-19 DIAGNOSIS — M25662 Stiffness of left knee, not elsewhere classified: Secondary | ICD-10-CM | POA: Diagnosis not present

## 2018-02-19 NOTE — Therapy (Signed)
[facility] [address] [place], [place], [address] Phone: [phone]   Fax:  [phone]  Physical Therapy Treatment  Patient Details  Name: Danielle Hicks MRN: [id number] Date of Birth: 12/19/1955 Referring Provider: Arther Abbott    Encounter Date: 02/19/2018  PT End of Session - 02/19/18 1705    Visit Number  3    Number of Visits  16    Date for PT Re-Evaluation  03/18/18 minireassess 03/09/18    Authorization Type  BCBS    Authorization Time Period  cert: 8/5-->8/85/02    Authorization - Visit Number  3    Authorization - Number of Visits  10    PT Start Time  7741    PT Stop Time  1650    PT Time Calculation (min)  45 min    Activity Tolerance  Patient tolerated treatment well    Behavior During Therapy  [facility] for tasks assessed/performed       Past Medical History:  Diagnosis Date  . Arthritis   . GERD (gastroesophageal reflux disease)   . Headache   . Hypertension   . Type 2 diabetes mellitus ([place])     Past Surgical History:  Procedure Laterality Date  . ABDOMINAL HYSTERECTOMY    . BLEPHAROPLASTY Bilateral   . HEMORRHOID SURGERY    . TOTAL KNEE ARTHROPLASTY Left 01/26/2018   Procedure: TOTAL KNEE ARTHROPLASTY;  Surgeon: Carole Civil, MD;  Location: [facility];  Service: Orthopedics;  Laterality: Left;    There were no vitals filed for this visit.  Subjective Assessment - 02/19/18 1618    Subjective  Pt stated she has increased edema today, has been on her feet more today.  Knee is stiff believed partially due to the rainy weather.  Pain scale 4/10 stiff and achey pain.      Pertinent History  DM, HTN    Patient Stated Goals  To get back walking; get back to sleeping better.     Currently in Pain?  Yes    Pain Score  4     Pain Location  Knee    Pain Orientation  Left;Anterior    Pain Descriptors / Indicators  Aching;Tightness;Sore    Pain Type  Acute pain    Pain Onset  1 to 4 weeks ago    Pain  Frequency  Intermittent    Aggravating Factors   activity    Pain Relieving Factors  medication, ice    Effect of Pain on Daily Activities  limits                       [facility] Adult PT Treatment/Exercise - 02/19/18 0001      Exercises   Exercises  Knee/Hip      Knee/Hip Exercises: Stretches   Active Hamstring Stretch  3 reps;30 seconds      Knee/Hip Exercises: Standing   Heel Raises  15 reps toe raises 15x    Knee Flexion  --    Terminal Knee Extension  Left;10 reps;Theraband    Theraband Level (Terminal Knee Extension)  Level 2 (Red)    Functional Squat  10 reps    Rocker Board  2 minutes    SLS  Lt 29", Rt 27" max of 3      Knee/Hip Exercises: Supine   Quad Sets  15 reps    Short Arc Quad Sets  15 reps    Heel Slides  15 reps  Bridges  10 reps    Straight Leg Raises  10 reps;Limitations    Straight Leg Raises Limitations  quad set prior SLR, slow and controlled    Knee Extension  AROM;Limitations    Knee Extension Limitations  8    Knee Flexion  AROM;Limitations    Knee Flexion Limitations  105      Manual Therapy   Manual Therapy  Edema management;Other (comment)    Manual therapy comments  Manual complete separate than rest of tx    Edema Management  Retro massage with LE elevated and ankle pumps    Other Manual Therapy  Measurement complete for compression hose: ankle 8", calf 14", thigh 22.25"             PT Education - 02/19/18 1656    Education provided  Yes    Education Details  Educated on benefits with compression hose, measurements taken and pt. given paperwork for purchase to assist with edema control and pain.      Person(s) Educated  Patient    Methods  Explanation;Handout    Comprehension  Verbalized understanding       PT Short Term Goals - 02/16/18 1610      PT SHORT TERM GOAL #1   Title  Pain in pt Lt knee of be no greater than a 6/10 to allow pt to sleep for four hours straight.     Time  3    Period  Weeks    Status   New    Target Date  03/09/18      PT SHORT TERM GOAL #2   Title  Pt to be able to single leg stance on both LE for 15 " to allow pt to feel confident walking without a cane.     Time  3    Period  Weeks    Status  New      PT SHORT TERM GOAL #3   Title  Pt to be able to stand for 15 mintues to prepare a small meal.     Time  3    Period  Weeks    Status  New      PT SHORT TERM GOAL #4   Title  PT to be able to walk for 20 minutes without increased pain to complete short shopping trips.     Time  3    Period  Weeks    Status  New        PT Long Term Goals - 02/16/18 1612      PT LONG TERM GOAL #1   Title  PT pain to be no greater than a 3/10 in her left knee to allow pt to sleep for 6 hours without difficulty.     Time  6    Period  Weeks    Status  New    Target Date  03/30/18      PT LONG TERM GOAL #2   Title  Pt to be able to stand for over 20 mintues without having increased Lt knee pain to be able to make a meal.     Time  6    Period  Weeks    Status  New      PT LONG TERM GOAL #3   Title  Pt to be able to walk for 40 minutes without increased Lt knee pain to complete shopping trips.     Time  6    Status  New  PT LONG TERM GOAL #4   Title  Pt Lt LE mm to increase to at least a 4+/5 to be able to go up and down steps in a reciprocal manner.     Time  6    Period  Weeks    Status  New      PT LONG TERM GOAL #5   Title  PT to be able to single leg stance for at least 40 seconds to allow pt to feel comfortable walking on uneven ground without her cane.     Time  6    Period  Weeks      Additional Long Term Goals   Additional Long Term Goals  Yes      PT LONG TERM GOAL #6   Title  PT Lt knee ROM to be below 4 degrees for extension and at least 120 degrees to allow pt to walk with a normal gait and squat without difficutty.     Time  6    Period  Weeks    Status  New            Plan - 02/19/18 1706    Clinical Impression Statement  Session  focus with knee mobilty and proximal strengthening.  Added bridges, standing TKE, SLS and SLR for quad and gluteal strengthening/stability.  EOS with manual techniques to assist with edema control.  Pt educated with benefits of compression hose, given paperwork of [employer] compression hose store and measurements take.  No reports of increased pain through session.      Rehab Potential  Good    PT Frequency  3x / week    PT Duration  6 weeks    PT Treatment/Interventions  Cryotherapy;Gait training;Stair training;Functional mobility training;Therapeutic activities;Therapeutic exercise;Balance training;Neuromuscular re-education;Patient/family education;Manual techniques    PT Next Visit Plan  Continue session focus with knee mobility exercises and quad/gluteal strengthening.  Added standing gastroc and hamstring stretches and knee drives next session.      PT Home Exercise Plan  heel raises; functional squat, LAQ, Qset, heel slide and sidelying abduction        Patient will benefit from skilled therapeutic intervention in order to improve the following deficits and impairments:  Decreased activity tolerance, Decreased balance, Decreased range of motion, Difficulty walking, Decreased strength, Pain  Visit Diagnosis: Stiffness of left knee, not elsewhere classified  Acute pain of left knee  Difficulty in walking, not elsewhere classified     Problem List Patient Active Problem List   Diagnosis Date Noted  . S/P total knee replacement, left 01/26/18 02/09/2018  . Primary osteoarthritis of left knee   . DDD (degenerative disc disease), cervical 01/06/2018  . Dyshidrotic eczema 08/20/2015  . Intertrigo 01/31/2015  . GERD (gastroesophageal reflux disease) 01/31/2015  . Thoracic back pain 12/13/2013  . Tinea pedis 07/06/2013  . Leg cramps 01/07/2013  . Major depressive disorder, recurrent episode, unspecified 01/02/2013  . Controlled type 2 diabetes mellitus without complication, without  long-term current use of insulin ([place]) 10/04/2012  . Essential hypertension, benign 10/04/2012  . Tobacco use 10/04/2012   Ihor Austin, [place]; [place]  Aldona Lento 02/19/2018, 5:12 PM  [facility] [address] [place], [place], [address] Phone: [phone]   Fax:  [phone]  Name: Danielle Hicks MRN: [id number] Date of Birth: 01-24-1956

## 2018-02-22 ENCOUNTER — Encounter (HOSPITAL_COMMUNITY): Payer: Self-pay

## 2018-02-22 ENCOUNTER — Ambulatory Visit (HOSPITAL_COMMUNITY): Payer: BLUE CROSS/BLUE SHIELD

## 2018-02-22 DIAGNOSIS — M25662 Stiffness of left knee, not elsewhere classified: Secondary | ICD-10-CM | POA: Diagnosis not present

## 2018-02-22 DIAGNOSIS — M25562 Pain in left knee: Secondary | ICD-10-CM

## 2018-02-22 DIAGNOSIS — R262 Difficulty in walking, not elsewhere classified: Secondary | ICD-10-CM

## 2018-02-22 NOTE — Therapy (Signed)
[facility] [address] [place], [place], [address] Phone: [phone]   Fax:  [phone]  Physical Therapy Treatment  Patient Details  Name: Danielle Hicks MRN: [id number] Date of Birth: Feb 28, 1956 Referring Provider: Arther Abbott    Encounter Date: 02/22/2018  PT End of Session - 02/22/18 1222    Visit Number  4    Number of Visits  16    Date for PT Re-Evaluation  03/18/18 minireassess 03/09/18    Authorization Type  BCBS    Authorization Time Period  cert: 9/6-->01/02/96    Authorization - Visit Number  4    Authorization - Number of Visits  10    PT Start Time  9892    PT Stop Time  1115    PT Time Calculation (min)  46 min    Activity Tolerance  Patient tolerated treatment well    Behavior During Therapy  [facility] for tasks assessed/performed       Past Medical History:  Diagnosis Date  . Arthritis   . GERD (gastroesophageal reflux disease)   . Headache   . Hypertension   . Type 2 diabetes mellitus ([place])     Past Surgical History:  Procedure Laterality Date  . ABDOMINAL HYSTERECTOMY    . BLEPHAROPLASTY Bilateral   . HEMORRHOID SURGERY    . TOTAL KNEE ARTHROPLASTY Left 01/26/2018   Procedure: TOTAL KNEE ARTHROPLASTY;  Surgeon: Carole Civil, MD;  Location: [facility];  Service: Orthopedics;  Laterality: Left;    There were no vitals filed for this visit.  Subjective Assessment - 02/22/18 1037    Subjective  Pain scale 4/10 stiff and achey pain.  Worked it quite a bit this weekend so more sore. Felt a pop like something in her incision gave way.     Pertinent History  DM, HTN    How long can you sit comfortably?  15 minutes and then she wants to move it around    How long can you stand comfortably?  10 minutes     How long can you walk comfortably?  walks with a cane for less five minutes     Patient Stated Goals  To get back walking; get back to sleeping better.     Currently in Pain?  Yes    Pain Score  4      Pain Location  Knee    Pain Orientation  Left;Lateral    Pain Descriptors / Indicators  Aching;Tightness;Sore    Pain Type  Acute pain    Pain Onset  --    Pain Frequency  Intermittent                       [facility] Adult PT Treatment/Exercise - 02/22/18 0001      Knee/Hip Exercises: Stretches   Active Hamstring Stretch  3 reps;30 seconds    Knee: Self-Stretch to increase Flexion  Left;10 seconds x10    Gastroc Stretch  Both;2 reps;60 seconds      Knee/Hip Exercises: Standing   Heel Raises  15 reps toe raises 15x    Terminal Knee Extension  Left;10 reps;Theraband    Theraband Level (Terminal Knee Extension)  Level 2 (Red)    Rocker Board  2 minutes    SLS  30 sec bil x2      Knee/Hip Exercises: Supine   Knee Extension  AROM;Limitations    Knee Extension Limitations  5    Knee Flexion  AROM;Limitations    Knee Flexion Limitations  107      Manual Therapy   Manual Therapy  Soft tissue mobilization;Myofascial release    Manual therapy comments  Manual complete separate than rest of tx    Soft tissue mobilization  Patellar tendon, distal ITB    Myofascial Release  direct scar massage             PT Education - 02/22/18 1221    Education provided  Yes    Education Details  scar massage, massage to ITB, progression of recovery from surgery, importance of HEP with progression    Person(s) Educated  Patient    Methods  Explanation    Comprehension  Verbalized understanding       PT Short Term Goals - 02/16/18 1610      PT SHORT TERM GOAL #1   Title  Pain in pt Lt knee of be no greater than a 6/10 to allow pt to sleep for four hours straight.     Time  3    Period  Weeks    Status  New    Target Date  03/09/18      PT SHORT TERM GOAL #2   Title  Pt to be able to single leg stance on both LE for 15 " to allow pt to feel confident walking without a cane.     Time  3    Period  Weeks    Status  New      PT SHORT TERM GOAL #3   Title  Pt to be  able to stand for 15 mintues to prepare a small meal.     Time  3    Period  Weeks    Status  New      PT SHORT TERM GOAL #4   Title  PT to be able to walk for 20 minutes without increased pain to complete short shopping trips.     Time  3    Period  Weeks    Status  New        PT Long Term Goals - 02/16/18 1612      PT LONG TERM GOAL #1   Title  PT pain to be no greater than a 3/10 in her left knee to allow pt to sleep for 6 hours without difficulty.     Time  6    Period  Weeks    Status  New    Target Date  03/30/18      PT LONG TERM GOAL #2   Title  Pt to be able to stand for over 20 mintues without having increased Lt knee pain to be able to make a meal.     Time  6    Period  Weeks    Status  New      PT LONG TERM GOAL #3   Title  Pt to be able to walk for 40 minutes without increased Lt knee pain to complete shopping trips.     Time  6    Status  New      PT LONG TERM GOAL #4   Title  Pt Lt LE mm to increase to at least a 4+/5 to be able to go up and down steps in a reciprocal manner.     Time  6    Period  Weeks    Status  New      PT LONG TERM GOAL #5  Title  PT to be able to single leg stance for at least 40 seconds to allow pt to feel comfortable walking on uneven ground without her cane.     Time  6    Period  Weeks      Additional Long Term Goals   Additional Long Term Goals  Yes      PT LONG TERM GOAL #6   Title  PT Lt knee ROM to be below 4 degrees for extension and at least 120 degrees to allow pt to walk with a normal gait and squat without difficutty.     Time  6    Period  Weeks    Status  New            Plan - 02/22/18 1223    Clinical Impression Statement  Patient exhibited an increase in AROM flexion to 107 and extension to 5. Discussed indirect scar massage and soft tissue mobilization to lateral knee to hep decrease pain. Patient verbalized she really worked on her HEP over the weekend. Continue with ROM, strength, balance and  manual therapies progressing dynamic and closed chair exercises as able.     History and Personal Factors relevant to plan of care:  HTN, CM    Rehab Potential  Good    PT Frequency  3x / week    PT Duration  6 weeks    PT Treatment/Interventions  Cryotherapy;Gait training;Stair training;Functional mobility training;Therapeutic activities;Therapeutic exercise;Balance training;Neuromuscular re-education;Patient/family education;Manual techniques    PT Next Visit Plan  Continue session focus with knee mobility exercises and quad/gluteal strengthening.  Added standing gastroc and hamstring stretches and knee drives. Has patient ordered compression garment?      PT Home Exercise Plan  heel raises; functional squat, LAQ, Qset, heel slide and sidelying abduction; 02/22/18 - scar massage, ITB soft tissue mobs.        Patient will benefit from skilled therapeutic intervention in order to improve the following deficits and impairments:  Decreased activity tolerance, Decreased balance, Decreased range of motion, Difficulty walking, Decreased strength, Pain  Visit Diagnosis: Stiffness of left knee, not elsewhere classified  Acute pain of left knee  Difficulty in walking, not elsewhere classified     Problem List Patient Active Problem List   Diagnosis Date Noted  . S/P total knee replacement, left 01/26/18 02/09/2018  . Primary osteoarthritis of left knee   . DDD (degenerative disc disease), cervical 01/06/2018  . Dyshidrotic eczema 08/20/2015  . Intertrigo 01/31/2015  . GERD (gastroesophageal reflux disease) 01/31/2015  . Thoracic back pain 12/13/2013  . Tinea pedis 07/06/2013  . Leg cramps 01/07/2013  . Major depressive disorder, recurrent episode, unspecified 01/02/2013  . Controlled type 2 diabetes mellitus without complication, without long-term current use of insulin ([place]) 10/04/2012  . Essential hypertension, benign 10/04/2012  . Tobacco use 10/04/2012    Floria Raveling. Hartnett-Rands,  MS, PT [facility] #[id number] 02/22/2018, 12:28 PM  [facility] [address] [place], [place], [address] Phone: [phone]   Fax:  [phone]  Name: TAYSIA RIVERE MRN: [id number] Date of Birth: 1956/06/27

## 2018-02-24 ENCOUNTER — Ambulatory Visit (HOSPITAL_COMMUNITY): Payer: BLUE CROSS/BLUE SHIELD | Admitting: Physical Therapy

## 2018-02-24 DIAGNOSIS — R262 Difficulty in walking, not elsewhere classified: Secondary | ICD-10-CM

## 2018-02-24 DIAGNOSIS — M25562 Pain in left knee: Secondary | ICD-10-CM

## 2018-02-24 DIAGNOSIS — M25662 Stiffness of left knee, not elsewhere classified: Secondary | ICD-10-CM | POA: Diagnosis not present

## 2018-02-24 NOTE — Therapy (Signed)
[facility] [address] [place], [place], [address] Phone: [phone]   Fax:  [phone]  Physical Therapy Treatment  Patient Details  Name: Danielle Hicks MRN: [id number] Date of Birth: 01-03-1956 Referring Provider: Arther Abbott    Encounter Date: 02/24/2018  PT End of Session - 02/24/18 1347    Visit Number  5    Number of Visits  16    Date for PT Re-Evaluation  03/18/18 minireassess 03/09/18    Authorization Type  BCBS    Authorization Time Period  cert: 0/3-->5/00/93    Authorization - Visit Number  5    Authorization - Number of Visits  10    PT Start Time  8182 pt was late    PT Stop Time  1345    PT Time Calculation (min)  30 min    Activity Tolerance  Patient tolerated treatment well    Behavior During Therapy  [facility] for tasks assessed/performed       Past Medical History:  Diagnosis Date  . Arthritis   . GERD (gastroesophageal reflux disease)   . Headache   . Hypertension   . Type 2 diabetes mellitus ([place])     Past Surgical History:  Procedure Laterality Date  . ABDOMINAL HYSTERECTOMY    . BLEPHAROPLASTY Bilateral   . HEMORRHOID SURGERY    . TOTAL KNEE ARTHROPLASTY Left 01/26/2018   Procedure: TOTAL KNEE ARTHROPLASTY;  Surgeon: Carole Civil, MD;  Location: [facility];  Service: Orthopedics;  Laterality: Left;    There were no vitals filed for this visit.  Subjective Assessment - 02/24/18 1317    Subjective  Pt states she is sore.  Reports 5/10 pain today.  States she did not order a compression garment as she feels she no longer needs one.     Currently in Pain?  Yes    Pain Score  5     Pain Location  Knee    Pain Orientation  Left    Pain Descriptors / Indicators  Aching;Tightness    Pain Type  Acute pain;Surgical pain                       [facility] Adult PT Treatment/Exercise - 02/24/18 0001      Knee/Hip Exercises: Stretches   Active Hamstring Stretch  3 reps;30 seconds    Knee: Self-Stretch to increase Flexion  Left;10 seconds;Limitations    Knee: Self-Stretch Limitations  10 reps onto 12" box    Gastroc Stretch  Both;2 reps;60 seconds;Limitations    Gastroc Stretch Limitations  slant board      Knee/Hip Exercises: Standing   Heel Raises  15 reps    Terminal Knee Extension  Left;Theraband;15 reps    Theraband Level (Terminal Knee Extension)  Level 2 (Red)    Lateral Step Up  Left;10 reps;Hand Hold: 1;Step Height: 4"    Forward Step Up  Left;10 reps;Hand Hold: 1;Step Height: 4"      Knee/Hip Exercises: Supine   Quad Sets  15 reps    Short Arc Quad Sets  15 reps    Heel Slides  15 reps    Knee Extension  AROM;Limitations    Knee Extension Limitations  3    Knee Flexion  AROM;Limitations    Knee Flexion Limitations  110      Manual Therapy   Manual Therapy  Myofascial release;Soft tissue mobilization    Manual therapy comments  Manual complete separate than  rest of tx    Soft tissue mobilization  to perimeter of knee to improve tissue mobilty    Myofascial Release  scar tissue and adhesions               PT Short Term Goals - 02/16/18 1610      PT SHORT TERM GOAL #1   Title  Pain in pt Lt knee of be no greater than a 6/10 to allow pt to sleep for four hours straight.     Time  3    Period  Weeks    Status  New    Target Date  03/09/18      PT SHORT TERM GOAL #2   Title  Pt to be able to single leg stance on both LE for 15 " to allow pt to feel confident walking without a cane.     Time  3    Period  Weeks    Status  New      PT SHORT TERM GOAL #3   Title  Pt to be able to stand for 15 mintues to prepare a small meal.     Time  3    Period  Weeks    Status  New      PT SHORT TERM GOAL #4   Title  PT to be able to walk for 20 minutes without increased pain to complete short shopping trips.     Time  3    Period  Weeks    Status  New        PT Long Term Goals - 02/16/18 1612      PT LONG TERM GOAL #1   Title  PT pain to  be no greater than a 3/10 in her left knee to allow pt to sleep for 6 hours without difficulty.     Time  6    Period  Weeks    Status  New    Target Date  03/30/18      PT LONG TERM GOAL #2   Title  Pt to be able to stand for over 20 mintues without having increased Lt knee pain to be able to make a meal.     Time  6    Period  Weeks    Status  New      PT LONG TERM GOAL #3   Title  Pt to be able to walk for 40 minutes without increased Lt knee pain to complete shopping trips.     Time  6    Status  New      PT LONG TERM GOAL #4   Title  Pt Lt LE mm to increase to at least a 4+/5 to be able to go up and down steps in a reciprocal manner.     Time  6    Period  Weeks    Status  New      PT LONG TERM GOAL #5   Title  PT to be able to single leg stance for at least 40 seconds to allow pt to feel comfortable walking on uneven ground without her cane.     Time  6    Period  Weeks      Additional Long Term Goals   Additional Long Term Goals  Yes      PT LONG TERM GOAL #6   Title  PT Lt knee ROM to be below 4 degrees for extension and at least 120 degrees  to allow pt to walk with a normal gait and squat without difficutty.     Time  6    Period  Weeks    Status  New            Plan - 02/24/18 1348    Clinical Impression Statement  Pt was late for appt. Continued with focus on improving ROM of knee.  Added lateral and forward step ups today without pain or issues.  Manual completed with large spasm in quad at proximal end of scar.  Able to resolve this and decrease density of scar tissue.  Pt with improved AROM at end of session 3-110 (was 5-107 last session).  Encouraged pateint to complete self massage.    Rehab Potential  Good    PT Frequency  3x / week    PT Duration  6 weeks    PT Treatment/Interventions  Cryotherapy;Gait training;Stair training;Functional mobility training;Therapeutic activities;Therapeutic exercise;Balance training;Neuromuscular  re-education;Patient/family education;Manual techniques    PT Next Visit Plan  Continue session focus with knee mobility exercises and quad/gluteal strengthening.  Continue to progress functional strength as ROM has improved.      PT Home Exercise Plan  heel raises; functional squat, LAQ, Qset, heel slide and sidelying abduction; 02/22/18 - scar massage, ITB soft tissue mobs.        Patient will benefit from skilled therapeutic intervention in order to improve the following deficits and impairments:  Decreased activity tolerance, Decreased balance, Decreased range of motion, Difficulty walking, Decreased strength, Pain  Visit Diagnosis: Stiffness of left knee, not elsewhere classified  Acute pain of left knee  Difficulty in walking, not elsewhere classified     Problem List Patient Active Problem List   Diagnosis Date Noted  . S/P total knee replacement, left 01/26/18 02/09/2018  . Primary osteoarthritis of left knee   . DDD (degenerative disc disease), cervical 01/06/2018  . Dyshidrotic eczema 08/20/2015  . Intertrigo 01/31/2015  . GERD (gastroesophageal reflux disease) 01/31/2015  . Thoracic back pain 12/13/2013  . Tinea pedis 07/06/2013  . Leg cramps 01/07/2013  . Major depressive disorder, recurrent episode, unspecified 01/02/2013  . Controlled type 2 diabetes mellitus without complication, without long-term current use of insulin ([place]) 10/04/2012  . Essential hypertension, benign 10/04/2012  . Tobacco use 10/04/2012   Teena Irani, PTA/CLT [phone]  Teena Irani 02/24/2018, 1:51 PM  [facility] [address] [place], [place], [address] Phone: [phone]   Fax:  [phone]  Name: Danielle Hicks MRN: [id number] Date of Birth: [birth year]

## 2018-02-25 ENCOUNTER — Other Ambulatory Visit: Payer: Self-pay | Admitting: Orthopedic Surgery

## 2018-02-25 MED ORDER — HYDROCODONE-ACETAMINOPHEN 5-325 MG PO TABS: 1.0000 | ORAL_TABLET | Freq: Four times a day (QID) | ORAL | 0 refills | Status: DC | PRN

## 2018-02-25 NOTE — Telephone Encounter (Signed)
Hydrocodone-Acetaminophen  5/325 mg  Qty  28 Tablets  Take 1 tablet by mouth every 6 (six) hours as needed for moderate pain.  PATIENT USES Danielle Hicks

## 2018-02-26 ENCOUNTER — Ambulatory Visit (HOSPITAL_COMMUNITY): Payer: BLUE CROSS/BLUE SHIELD

## 2018-02-26 ENCOUNTER — Encounter (HOSPITAL_COMMUNITY): Payer: Self-pay

## 2018-02-26 DIAGNOSIS — M25562 Pain in left knee: Secondary | ICD-10-CM

## 2018-02-26 DIAGNOSIS — M25662 Stiffness of left knee, not elsewhere classified: Secondary | ICD-10-CM | POA: Diagnosis not present

## 2018-02-26 DIAGNOSIS — R262 Difficulty in walking, not elsewhere classified: Secondary | ICD-10-CM

## 2018-02-26 NOTE — Patient Instructions (Signed)
Knee Extension: Terminal - Standing (Single Leg)    Face anchor in shoulder width stance, band around knee. Allow tension of band to slightly bend knee. Pull leg back, straightening knee. Repeat 15 times per set, hold for 5' seconds. Do 2 sets per session.  Anchor Height: Knee  [email or web address].exer.us/36   Copyright  VHI. All rights reserved.

## 2018-02-26 NOTE — Therapy (Signed)
[facility] [place], [place], [address] Phone: [phone]   Fax:  [phone]  Physical Therapy Treatment  Patient Details  Name: Danielle Hicks MRN: [id number] Date of Birth: 02-Sep-1956 Referring Provider: Arther Abbott, MD   Encounter Date: 02/26/2018  PT End of Session - 02/26/18 1640    Visit Number  6    Number of Visits  16    Date for PT Re-Evaluation  03/18/18 minireassess 03/09/18    Authorization Type  BCBS    Authorization Time Period  cert: 2/3-->5/36/14    Authorization - Visit Number  6    Authorization - Number of Visits  10    PT Start Time  4315    PT Stop Time  4008 2' on bike, no charge    PT Time Calculation (min)  50 min    Activity Tolerance  Patient tolerated treatment well    Behavior During Therapy  [facility] for tasks assessed/performed       Past Medical History:  Diagnosis Date  . Arthritis   . GERD (gastroesophageal reflux disease)   . Headache   . Hypertension   . Type 2 diabetes mellitus ([place])     Past Surgical History:  Procedure Laterality Date  . ABDOMINAL HYSTERECTOMY    . BLEPHAROPLASTY Bilateral   . HEMORRHOID SURGERY    . TOTAL KNEE ARTHROPLASTY Left 01/26/2018   Procedure: TOTAL KNEE ARTHROPLASTY;  Surgeon: Carole Civil, MD;  Location: [facility];  Service: Orthopedics;  Laterality: Left;    There were no vitals filed for this visit.  Subjective Assessment - 02/26/18 1636    Subjective  Pt stated she feels pretty good today, mainly stiffness today.  Reports she has not ordered compression hose yet, feels her swelling is going down. Mainly stiffnes today.    Patient Stated Goals  To get back walking; get back to sleeping better.     Currently in Pain?  Yes    Pain Score  2     Pain Location  Knee    Pain Orientation  Left    Pain Descriptors / Indicators  Dull;Aching;Sore;Tightness    Pain Type  Surgical pain    Pain Onset  1 to 4 weeks ago    Pain Frequency   Intermittent    Aggravating Factors   activity    Pain Relieving Factors  medication, ice    Effect of Pain on Daily Activities  limits         [facility] PT Assessment - 02/26/18 0001      Assessment   Medical Diagnosis  LT TKR     Referring Provider  Arther Abbott, MD    Onset Date/Surgical Date  01/26/18    Next MD Visit  03/10/2018    Prior Therapy  acute, HH      Precautions   Precautions  None                   [facility] Adult PT Treatment/Exercise - 02/26/18 0001      Knee/Hip Exercises: Stretches   Active Hamstring Stretch  3 reps;30 seconds supine    Knee: Self-Stretch to increase Flexion  Left;10 seconds;Limitations    Knee: Self-Stretch Limitations  10 reps onto 12" box    Gastroc Stretch  3 reps;30 seconds    Gastroc Stretch Limitations  slant board      Knee/Hip Exercises: Aerobic   Stationary Bike  seat 8 x 2' for  mobility, not included in charges      Knee/Hip Exercises: Standing   Heel Raises  20 reps Toe raises    Terminal Knee Extension  Left;Theraband;15 reps    Theraband Level (Terminal Knee Extension)  Level 3 (Green) HEP    Lateral Step Up  Left;15 reps;Hand Hold: 1;Step Height: 4"    Forward Step Up  Left;15 reps;Hand Hold: 0;Step Height: 6"    SLS  Rt 38". Lt 34"    Gait Training  235ft no AD, good mechanics      Knee/Hip Exercises: Supine   Quad Sets  15 reps    Short Arc Quad Sets  15 reps    Heel Slides  15 reps    Bridges  20 reps    Straight Leg Raises  2 sets;10 reps    Straight Leg Raises Limitations  cueing for quad set prior SLR, slow and controlled    Knee Extension  AROM;Limitations    Knee Extension Limitations  3    Knee Flexion  AROM;Limitations    Knee Flexion Limitations  110      Manual Therapy   Manual Therapy  Myofascial release;Soft tissue mobilization    Manual therapy comments  Manual complete separate than rest of tx    Soft tissue mobilization  to perimeter of knee to improve tissue mobilty    Myofascial  Release  scar tissue and adhesions               PT Short Term Goals - 02/16/18 1610      PT SHORT TERM GOAL #1   Title  Pain in pt Lt knee of be no greater than a 6/10 to allow pt to sleep for four hours straight.     Time  3    Period  Weeks    Status  New    Target Date  03/09/18      PT SHORT TERM GOAL #2   Title  Pt to be able to single leg stance on both LE for 15 " to allow pt to feel confident walking without a cane.     Time  3    Period  Weeks    Status  New      PT SHORT TERM GOAL #3   Title  Pt to be able to stand for 15 mintues to prepare a small meal.     Time  3    Period  Weeks    Status  New      PT SHORT TERM GOAL #4   Title  PT to be able to walk for 20 minutes without increased pain to complete short shopping trips.     Time  3    Period  Weeks    Status  New        PT Long Term Goals - 02/16/18 1612      PT LONG TERM GOAL #1   Title  PT pain to be no greater than a 3/10 in her left knee to allow pt to sleep for 6 hours without difficulty.     Time  6    Period  Weeks    Status  New    Target Date  03/30/18      PT LONG TERM GOAL #2   Title  Pt to be able to stand for over 20 mintues without having increased Lt knee pain to be able to make a meal.     Time  6  Period  Weeks    Status  New      PT LONG TERM GOAL #3   Title  Pt to be able to walk for 40 minutes without increased Lt knee pain to complete shopping trips.     Time  6    Status  New      PT LONG TERM GOAL #4   Title  Pt Lt LE mm to increase to at least a 4+/5 to be able to go up and down steps in a reciprocal manner.     Time  6    Period  Weeks    Status  New      PT LONG TERM GOAL #5   Title  PT to be able to single leg stance for at least 40 seconds to allow pt to feel comfortable walking on uneven ground without her cane.     Time  6    Period  Weeks      Additional Long Term Goals   Additional Long Term Goals  Yes      PT LONG TERM GOAL #6   Title  PT  Lt knee ROM to be below 4 degrees for extension and at least 120 degrees to allow pt to walk with a normal gait and squat without difficutty.     Time  6    Period  Weeks    Status  New            Plan - 02/26/18 1731    Clinical Impression Statement  Pt progressing well towards goals.  Pt arrived with reports of gait at home wihtout AD, good mechanics noted.  Encouraged pt to continue wiht SPC long distances and dynamic surfaces.  Presents with minimal edema proximal knee and minimal pain.  Session focus wiht knee mobility, functional strengthening and manual technqiues to address soft tissue and myofascial restrictions to improve mobilty.  Increased step up height to 6" as progressing for functional strengthening.  Reviewed HEP wiht additional standing TKE to improve knee extension.  AROM 3-110 degrees at EOS.  Pt stated she has bike at home, instructed proper use to reduce hip compensations and encouraged for use at home.      Rehab Potential  Good    PT Frequency  3x / week    PT Duration  6 weeks    PT Treatment/Interventions  Cryotherapy;Gait training;Stair training;Functional mobility training;Therapeutic activities;Therapeutic exercise;Balance training;Neuromuscular re-education;Patient/family education;Manual techniques    PT Next Visit Plan  Continue session focus with knee mobility exercises and quad/gluteal strengthening.  Continue to progress functional strength as ROM has improved.  Add step downs and sidestepping next session.    PT Home Exercise Plan  heel raises; functional squat, LAQ, Qset, heel slide and sidelying abduction; 02/22/18 - scar massage, ITB soft tissue mobs. ; 02/26/18:  standing TKE with theraband given       Patient will benefit from skilled therapeutic intervention in order to improve the following deficits and impairments:  Decreased activity tolerance, Decreased balance, Decreased range of motion, Difficulty walking, Decreased strength, Pain  Visit  Diagnosis: Stiffness of left knee, not elsewhere classified  Acute pain of left knee  Difficulty in walking, not elsewhere classified     Problem List Patient Active Problem List   Diagnosis Date Noted  . S/P total knee replacement, left 01/26/18 02/09/2018  . Primary osteoarthritis of left knee   . DDD (degenerative disc disease), cervical 01/06/2018  . Dyshidrotic eczema 08/20/2015  .  Intertrigo 01/31/2015  . GERD (gastroesophageal reflux disease) 01/31/2015  . Thoracic back pain 12/13/2013  . Tinea pedis 07/06/2013  . Leg cramps 01/07/2013  . Major depressive disorder, recurrent episode, unspecified 01/02/2013  . Controlled type 2 diabetes mellitus without complication, without long-term current use of insulin ([place]) 10/04/2012  . Essential hypertension, benign 10/04/2012  . Tobacco use 10/04/2012   Ihor Austin, [place]; [place]  Aldona Lento 02/26/2018, 5:39 PM  [facility] [address] [place], [place], [address] Phone: [phone]   Fax:  [phone]  Name: CHELSI WARR MRN: [id number] Date of Birth: [birth year]

## 2018-03-01 ENCOUNTER — Ambulatory Visit (HOSPITAL_COMMUNITY): Payer: BLUE CROSS/BLUE SHIELD | Admitting: Physical Therapy

## 2018-03-01 DIAGNOSIS — R262 Difficulty in walking, not elsewhere classified: Secondary | ICD-10-CM

## 2018-03-01 DIAGNOSIS — M25662 Stiffness of left knee, not elsewhere classified: Secondary | ICD-10-CM

## 2018-03-01 DIAGNOSIS — M25562 Pain in left knee: Secondary | ICD-10-CM

## 2018-03-01 NOTE — Therapy (Signed)
[facility] [place], [place], [address] Phone: [phone]   Fax:  [phone]  Physical Therapy Treatment  Patient Details  Name: Danielle Hicks MRN: [id number] Date of Birth: 08/07/1956 Referring Provider: Arther Abbott, MD   Encounter Date: 03/01/2018  PT End of Session - 03/01/18 1701    Visit Number  7    Number of Visits  16    Date for PT Re-Evaluation  03/18/18 minireassess 03/09/18    Authorization Type  BCBS    Authorization Time Period  cert: 6/9-->05/09/51    Authorization - Visit Number  7    Authorization - Number of Visits  10    PT Start Time  8413    PT Stop Time  1650    PT Time Calculation (min)  45 min    Activity Tolerance  Patient tolerated treatment well    Behavior During Therapy  [facility] for tasks assessed/performed       Past Medical History:  Diagnosis Date  . Arthritis   . GERD (gastroesophageal reflux disease)   . Headache   . Hypertension   . Type 2 diabetes mellitus ([place])     Past Surgical History:  Procedure Laterality Date  . ABDOMINAL HYSTERECTOMY    . BLEPHAROPLASTY Bilateral   . HEMORRHOID SURGERY    . TOTAL KNEE ARTHROPLASTY Left 01/26/2018   Procedure: TOTAL KNEE ARTHROPLASTY;  Surgeon: Carole Civil, MD;  Location: [facility];  Service: Orthopedics;  Laterality: Left;    There were no vitals filed for this visit.  Subjective Assessment - 03/01/18 1714    Subjective  Pt states her Rt knee is hurting as well as her LT.  STates she will have to get that one replaced too.  Currently with 3/10 pain in Lt knee.    Currently in Pain?  Yes    Pain Score  3     Pain Location  Knee    Pain Orientation  Left    Pain Descriptors / Indicators  Sore;Tightness                       [facility] Adult PT Treatment/Exercise - 03/01/18 0001      Knee/Hip Exercises: Stretches   Active Hamstring Stretch  --    Knee: Self-Stretch to increase Flexion  Left;10  seconds;Limitations    Knee: Self-Stretch Limitations  10 reps onto 12" box    Gastroc Stretch  3 reps;30 seconds    Gastroc Stretch Limitations  slant board      Knee/Hip Exercises: Aerobic   Stationary Bike  seat 11 x 2' for mobility, not included in charges      Knee/Hip Exercises: Standing   Heel Raises  20 reps    Lateral Step Up  Left;15 reps;Hand Hold: 1;Step Height: 4"    Forward Step Up  Left;15 reps;Hand Hold: 0;Step Height: 6"    Step Down  Left;15 reps;Hand Hold: 1;Step Height: 4"    SLS with Vectors  10X3" each with UE assist      Knee/Hip Exercises: Supine   Quad Sets  15 reps    Heel Slides  15 reps    Knee Extension  AROM;Limitations    Knee Extension Limitations  2    Knee Flexion  AROM;Limitations    Knee Flexion Limitations  112      Manual Therapy   Manual Therapy  Myofascial release;Soft tissue mobilization    Manual therapy  comments  Manual complete separate than rest of tx    Soft tissue mobilization  to perimeter of knee to improve tissue mobilty    Myofascial Release  scar tissue and adhesions               PT Short Term Goals - 02/16/18 1610      PT SHORT TERM GOAL #1   Title  Pain in pt Lt knee of be no greater than a 6/10 to allow pt to sleep for four hours straight.     Time  3    Period  Weeks    Status  New    Target Date  03/09/18      PT SHORT TERM GOAL #2   Title  Pt to be able to single leg stance on both LE for 15 " to allow pt to feel confident walking without a cane.     Time  3    Period  Weeks    Status  New      PT SHORT TERM GOAL #3   Title  Pt to be able to stand for 15 mintues to prepare a small meal.     Time  3    Period  Weeks    Status  New      PT SHORT TERM GOAL #4   Title  PT to be able to walk for 20 minutes without increased pain to complete short shopping trips.     Time  3    Period  Weeks    Status  New        PT Long Term Goals - 02/16/18 1612      PT LONG TERM GOAL #1   Title  PT pain to  be no greater than a 3/10 in her left knee to allow pt to sleep for 6 hours without difficulty.     Time  6    Period  Weeks    Status  New    Target Date  03/30/18      PT LONG TERM GOAL #2   Title  Pt to be able to stand for over 20 mintues without having increased Lt knee pain to be able to make a meal.     Time  6    Period  Weeks    Status  New      PT LONG TERM GOAL #3   Title  Pt to be able to walk for 40 minutes without increased Lt knee pain to complete shopping trips.     Time  6    Status  New      PT LONG TERM GOAL #4   Title  Pt Lt LE mm to increase to at least a 4+/5 to be able to go up and down steps in a reciprocal manner.     Time  6    Period  Weeks    Status  New      PT LONG TERM GOAL #5   Title  PT to be able to single leg stance for at least 40 seconds to allow pt to feel comfortable walking on uneven ground without her cane.     Time  6    Period  Weeks      Additional Long Term Goals   Additional Long Term Goals  Yes      PT LONG TERM GOAL #6   Title  PT Lt knee ROM to be below 4 degrees for  extension and at least 120 degrees to allow pt to walk with a normal gait and squat without difficutty.     Time  6    Period  Weeks    Status  New            Plan - 03/01/18 1703    Clinical Impression Statement  PT continues to work on her gait at home without use of AD.  Continues to have large mass of scar tissue at both distal and proximal ends of scar.  Able to soften quite a bit with manual, however pt reports it usually returns by next day.  AROM continues to progress with todays 2-112 degrees, up from 3-110 at last visit.   PRogressed this session with added forward step downs and vectors to improve stabiliy and eccentric strength.      Rehab Potential  Good    PT Frequency  3x / week    PT Duration  6 weeks    PT Treatment/Interventions  Cryotherapy;Gait training;Stair training;Functional mobility training;Therapeutic activities;Therapeutic  exercise;Balance training;Neuromuscular re-education;Patient/family education;Manual techniques    PT Next Visit Plan  Continue session focus with knee mobility exercises and quad/gluteal strengthening.  Continue to progress functional strength as ROM has improved.  Add sidestepping next session.    PT Home Exercise Plan  heel raises; functional squat, LAQ, Qset, heel slide and sidelying abduction; 02/22/18 - scar massage, ITB soft tissue mobs. ; 02/26/18:  standing TKE with theraband given       Patient will benefit from skilled therapeutic intervention in order to improve the following deficits and impairments:  Decreased activity tolerance, Decreased balance, Decreased range of motion, Difficulty walking, Decreased strength, Pain  Visit Diagnosis: Stiffness of left knee, not elsewhere classified  Acute pain of left knee  Difficulty in walking, not elsewhere classified     Problem List Patient Active Problem List   Diagnosis Date Noted  . S/P total knee replacement, left 01/26/18 02/09/2018  . Primary osteoarthritis of left knee   . DDD (degenerative disc disease), cervical 01/06/2018  . Dyshidrotic eczema 08/20/2015  . Intertrigo 01/31/2015  . GERD (gastroesophageal reflux disease) 01/31/2015  . Thoracic back pain 12/13/2013  . Tinea pedis 07/06/2013  . Leg cramps 01/07/2013  . Major depressive disorder, recurrent episode, unspecified 01/02/2013  . Controlled type 2 diabetes mellitus without complication, without long-term current use of insulin ([place]) 10/04/2012  . Essential hypertension, benign 10/04/2012  . Tobacco use 10/04/2012   Teena Irani, PTA/CLT [phone]  Teena Irani 03/01/2018, 5:15 PM  [facility] [address] [place], [place], [address] Phone: [phone]   Fax:  [phone]  Name: Danielle Hicks MRN: [id number] Date of Birth: December 17, 1955

## 2018-03-03 ENCOUNTER — Ambulatory Visit (HOSPITAL_COMMUNITY): Payer: BLUE CROSS/BLUE SHIELD | Admitting: Physical Therapy

## 2018-03-03 ENCOUNTER — Telehealth (HOSPITAL_COMMUNITY): Payer: Self-pay | Admitting: Physical Therapy

## 2018-03-03 ENCOUNTER — Encounter (HOSPITAL_COMMUNITY): Payer: Self-pay | Admitting: Physical Therapy

## 2018-03-03 DIAGNOSIS — R262 Difficulty in walking, not elsewhere classified: Secondary | ICD-10-CM

## 2018-03-03 DIAGNOSIS — M25662 Stiffness of left knee, not elsewhere classified: Secondary | ICD-10-CM | POA: Diagnosis not present

## 2018-03-03 DIAGNOSIS — M25562 Pain in left knee: Secondary | ICD-10-CM

## 2018-03-03 NOTE — Therapy (Signed)
[facility] [address] [place], [place], [address] Phone: [phone]   Fax:  [phone]  Physical Therapy Treatment  Patient Details  Name: Danielle Hicks MRN: [id number] Date of Birth: December 06, 1955 Referring Provider: Arther Abbott   Progress Note Reporting Period 02/16/2018 to 03/03/2018 ROM 6-120 Able to ambulate x 30 minutes   See note below for Objective Data and Assessment of Progress/Goals.      Encounter Date: 03/03/2018  PT End of Session - 03/03/18 1503    Visit Number  8    Number of Visits  16    Date for PT Re-Evaluation  03/18/18 minireassess done     Authorization Type  BCBS    Authorization Time Period  cert: 4/0-->0/86/76    Authorization - Visit Number  8    Authorization - Number of Visits  10    PT Start Time  1435    PT Stop Time  1515    PT Time Calculation (min)  40 min    Activity Tolerance  Patient tolerated treatment well    Behavior During Therapy  WFL for tasks assessed/performed       Past Medical History:  Diagnosis Date  . Arthritis   . GERD (gastroesophageal reflux disease)   . Headache   . Hypertension   . Type 2 diabetes mellitus ([place])     Past Surgical History:  Procedure Laterality Date  . ABDOMINAL HYSTERECTOMY    . BLEPHAROPLASTY Bilateral   . HEMORRHOID SURGERY    . TOTAL KNEE ARTHROPLASTY Left 01/26/2018   Procedure: TOTAL KNEE ARTHROPLASTY;  Surgeon: Carole Civil, MD;  Location: [facility];  Service: Orthopedics;  Laterality: Left;    There were no vitals filed for this visit.  Subjective Assessment - 03/03/18 1441    Subjective  Danielle Hicks states that her pain is about a 3/10 now.  She states that she has been cooking and cleaning now.  She is still having difficulty standing for long periods of time.      Pertinent History  DM, HTN    How long can you sit comfortably?  No limitations at this point was 15 minutes and then she wants to move it around    How long  can you stand comfortably?  20 minutes was 10 minutes     How long can you walk comfortably?  walks without a cane in her home.  She can walk for 30 minutes now was walking with  a cane for less five minutes     Patient Stated Goals  To get back walking; get back to sleeping better.     Currently in Pain?  Yes    Pain Score  3     Pain Location  Knee    Pain Orientation  Left    Pain Type  Acute pain    Pain Onset  1 to 4 weeks ago    Pain Frequency  Intermittent    Aggravating Factors   standing to long     Pain Relieving Factors  massaging     Effect of Pain on Daily Activities  limits          [facility] PT Assessment - 03/03/18 0001      Assessment   Medical Diagnosis  LT TKR     Referring Provider  Arther Abbott     Onset Date/Surgical Date  01/26/18    Next MD Visit  03/10/2018    Prior Therapy  acute, HH      Precautions   Precautions  None      Restrictions   Weight Bearing Restrictions  No      Balance Screen   Has the patient fallen in the past 6 months  No    Has the patient had a decrease in activity level because of a fear of falling?   Yes    Is the patient reluctant to leave their home because of a fear of falling?   No      Observation/Other Assessments   Focus on Therapeutic Outcomes (FOTO)   69 was 58       Single Leg Stance   Comments  RT: 42 seconds was 18; 45 seconds was 10      Sit to Stand   Comments  5x 9.72 was 11.71       AROM   Left Knee Extension  6 was 9     Left Knee Flexion  120 with difficutly was 105      Strength   Right Hip Extension  4/5 was 3/5     Left Hip Flexion  5/5    Left Hip Extension  4/5 was 3/5     Left Hip ABduction  5/5 was 3+/5     Right/Left Knee  Right    Right Knee Flexion  5/5    Right Knee Extension  4+/5 was 3+/5     Left Knee Flexion  4+/5    Right Ankle Dorsiflexion  5/5    Left Ankle Dorsiflexion  4+/5 was 4/5       Ambulation/Gait   Ambulation Distance (Feet)  605 Feet was 502 with cane      Assistive device  None    Gait Comments  3"                   [facility] Adult PT Treatment/Exercise - 03/03/18 0001      Knee/Hip Exercises: Stretches   Active Hamstring Stretch  Left;2 reps;30 seconds      Knee/Hip Exercises: Standing   SLS  x3      Knee/Hip Exercises: Supine   Quad Sets  10 reps    Heel Slides  5 reps    Knee Extension Limitations  6    Knee Flexion Limitations  120      Knee/Hip Exercises: Prone   Hip Extension  Both;15 reps               PT Short Term Goals - 03/03/18 1501      PT SHORT TERM GOAL #1   Title  Pain in pt Lt knee of be no greater than a 6/10 to allow pt to sleep for four hours straight.     Time  3    Period  Weeks    Status  Achieved      PT SHORT TERM GOAL #2   Title  Pt to be able to single leg stance on both LE for 15 " to allow pt to feel confident walking without a cane.     Baseline  able to stand for greater than 15" but still using a cane     Time  3    Period  Weeks    Status  Partially Met      PT SHORT TERM GOAL #3   Title  Pt to be able to stand for 15 mintues to prepare a small meal.     Time  3    Period  Weeks    Status  Achieved      PT SHORT TERM GOAL #4   Title  PT to be able to walk for 20 minutes without increased pain to complete short shopping trips.     Time  3    Period  Weeks    Status  Achieved        PT Long Term Goals - 03/03/18 1502      PT LONG TERM GOAL #1   Title  PT pain to be no greater than a 3/10 in her left knee to allow pt to sleep for 6 hours without difficulty.     Baseline  Able to sleep for 5 hours at a time now     Time  6    Period  Weeks    Status  On-going      PT LONG TERM GOAL #2   Title  Pt to be able to stand for over 20 mintues without having increased Lt knee pain to be able to make a meal.     Time  6    Period  Weeks    Status  On-going      PT LONG TERM GOAL #3   Title  Pt to be able to walk for 40 minutes without increased Lt knee pain to  complete shopping trips.     Time  6    Status  On-going      PT LONG TERM GOAL #4   Title  Pt Lt LE mm to increase to at least a 4+/5 to be able to go up and down steps in a reciprocal manner.     Time  6    Period  Weeks    Status  Achieved      PT LONG TERM GOAL #5   Title  PT to be able to single leg stance for at least 40 seconds to allow pt to feel comfortable walking on uneven ground without her cane.     Time  6    Period  Weeks      PT LONG TERM GOAL #6   Title  PT Lt knee ROM to be below 4 degrees for extension and at least 120 degrees to allow pt to walk with a normal gait and squat without difficutty.     Time  6    Period  Weeks    Status  Partially Met            Plan - 03/03/18 1515    Clinical Impression Statement  PT reassessed with only limitation being slight decreased range, slight increased swelling, decreased hip extensor and hamstring strength and slight decreased balalnce.  Pt has progreassed well over all .  Pt goal is to return to work fulll time.  Pt advised to stop using her cane.  We will continue to work on deficits in prepartion for return to work.     Rehab Potential  Good    PT Frequency  3x / week    PT Duration  6 weeks    PT Treatment/Interventions  Cryotherapy;Gait training;Stair training;Functional mobility training;Therapeutic activities;Therapeutic exercise;Balance training;Neuromuscular re-education;Patient/family education;Manual techniques    PT Next Visit Plan  Continue session focus with knee mobility exercises and hamstring /gluteal strengthening.  Continue to progress functional strength as ROM has improved.  Add sidestepping next session.    PT Home Exercise Plan  heel raises; functional squat, LAQ, Qset, heel slide  and sidelying abduction; 02/22/18 - scar massage, ITB soft tissue mobs. ; 02/26/18:  standing TKE with theraband given       Patient will benefit from skilled therapeutic intervention in order to improve the following  deficits and impairments:  Decreased activity tolerance, Decreased balance, Decreased range of motion, Difficulty walking, Decreased strength, Pain  Visit Diagnosis: Stiffness of left knee, not elsewhere classified  Acute pain of left knee  Difficulty in walking, not elsewhere classified     Problem List Patient Active Problem List   Diagnosis Date Noted  . S/P total knee replacement, left 01/26/18 02/09/2018  . Primary osteoarthritis of left knee   . DDD (degenerative disc disease), cervical 01/06/2018  . Dyshidrotic eczema 08/20/2015  . Intertrigo 01/31/2015  . GERD (gastroesophageal reflux disease) 01/31/2015  . Thoracic back pain 12/13/2013  . Tinea pedis 07/06/2013  . Leg cramps 01/07/2013  . Major depressive disorder, recurrent episode, unspecified 01/02/2013  . Controlled type 2 diabetes mellitus without complication, without long-term current use of insulin ([place]) 10/04/2012  . Essential hypertension, benign 10/04/2012  . Tobacco use 10/04/2012   Rayetta Humphrey, PT CLT [phone] 03/03/2018, 3:19 PM  [facility] [address] [place], [place], [address] Phone: [phone]   Fax:  [phone]  Name: BERNADETTE GORES MRN: [id number] Date of Birth: 1956-11-02

## 2018-03-03 NOTE — Telephone Encounter (Signed)
PT did not show for appt. Called and left message regarding missed appt and reminder of next one Friday at 1pm.  Requested pt call and cancel appt if she is unable to make it.  Teena Irani, PTA/CLT [phone]

## 2018-03-05 ENCOUNTER — Ambulatory Visit (HOSPITAL_COMMUNITY): Payer: BLUE CROSS/BLUE SHIELD

## 2018-03-05 ENCOUNTER — Encounter (HOSPITAL_COMMUNITY): Payer: Self-pay

## 2018-03-05 DIAGNOSIS — M25662 Stiffness of left knee, not elsewhere classified: Secondary | ICD-10-CM

## 2018-03-05 DIAGNOSIS — R262 Difficulty in walking, not elsewhere classified: Secondary | ICD-10-CM

## 2018-03-05 DIAGNOSIS — M25562 Pain in left knee: Secondary | ICD-10-CM

## 2018-03-05 NOTE — Therapy (Signed)
[facility] [address] [place], [place], [address] Phone: [phone]   Fax:  [phone]  Physical Therapy Treatment  Patient Details  Name: Danielle Hicks MRN: [id number] Date of Birth: Oct 05, 1956 Referring Provider: Arther Abbott    Encounter Date: 03/05/2018  PT End of Session - 03/05/18 1308    Visit Number  9    Number of Visits  16    Date for PT Re-Evaluation  03/18/18 Minireassess completed on 03/03/18    Authorization Type  BCBS    Authorization Time Period  cert: 2/6-->9/48/54    PT Start Time  1301    PT Stop Time  1345    PT Time Calculation (min)  44 min    Activity Tolerance  Patient tolerated treatment well    Behavior During Therapy  [facility] for tasks assessed/performed       Past Medical History:  Diagnosis Date  . Arthritis   . GERD (gastroesophageal reflux disease)   . Headache   . Hypertension   . Type 2 diabetes mellitus ([place])     Past Surgical History:  Procedure Laterality Date  . ABDOMINAL HYSTERECTOMY    . BLEPHAROPLASTY Bilateral   . HEMORRHOID SURGERY    . TOTAL KNEE ARTHROPLASTY Left 01/26/2018   Procedure: TOTAL KNEE ARTHROPLASTY;  Surgeon: Carole Civil, MD;  Location: [facility];  Service: Orthopedics;  Laterality: Left;    There were no vitals filed for this visit.  Subjective Assessment - 03/05/18 1305    Subjective  Pt stated her knee is stiff today, reports minimal swelling and overall pain.  Pt arrived ambulating no AD, reports she hasn't used since last PT session.      Patient Stated Goals  To get back walking; get back to sleeping better.     Currently in Pain?  Yes    Pain Score  3     Pain Location  Knee    Pain Orientation  Left    Pain Descriptors / Indicators  Tightness    Pain Type  Acute pain    Pain Onset  1 to 4 weeks ago    Pain Frequency  Intermittent    Aggravating Factors   standing too long    Pain Relieving Factors  massaging    Effect of Pain on Daily  Activities  limits                       [facility] Adult PT Treatment/Exercise - 03/05/18 0001      Knee/Hip Exercises: Stretches   Active Hamstring Stretch  Left;3 reps;30 seconds supine    Knee: Self-Stretch to increase Flexion  5 reps;10 seconds    Knee: Self-Stretch Limitations  knee drive for mobility 5x 10" on 12in step    Gastroc Stretch  3 reps;30 seconds    Gastroc Stretch Limitations  slant board      Knee/Hip Exercises: Standing   Heel Raises  20 reps cueing for form with toe raises    Lateral Step Up  Left;15 reps;Hand Hold: 1;Step Height: 4"    Forward Step Up  Left;15 reps;Hand Hold: 0;Step Height: 6"    Step Down  Left;15 reps;Hand Hold: 1;Step Height: 6"    Functional Squat  10 reps    SLS with Vectors  5x 5" no HHA    Other Standing Knee Exercises  sidestep with RTB      Knee/Hip Exercises: Supine   Quad Sets  15 reps    Short Arc [employer]  15 reps    Bridges  20 reps    Knee Extension  AROM;Limitations    Knee Extension Limitations  5    Knee Flexion  AROM;Limitations    Knee Flexion Limitations  118      Manual Therapy   Manual Therapy  Myofascial release;Soft tissue mobilization    Manual therapy comments  Manual complete separate than rest of tx    Edema Management  --    Soft tissue mobilization  to perimeter of knee to improve tissue mobilty    Myofascial Release  scar tissue and adhesions               PT Short Term Goals - 03/03/18 1501      PT SHORT TERM GOAL #1   Title  Pain in pt Lt knee of be no greater than a 6/10 to allow pt to sleep for four hours straight.     Time  3    Period  Weeks    Status  Achieved      PT SHORT TERM GOAL #2   Title  Pt to be able to single leg stance on both LE for 15 " to allow pt to feel confident walking without a cane.     Baseline  able to stand for greater than 15" but still using a cane     Time  3    Period  Weeks    Status  Partially Met      PT SHORT TERM GOAL #3   Title  Pt  to be able to stand for 15 mintues to prepare a small meal.     Time  3    Period  Weeks    Status  Achieved      PT SHORT TERM GOAL #4   Title  PT to be able to walk for 20 minutes without increased pain to complete short shopping trips.     Time  3    Period  Weeks    Status  Achieved        PT Long Term Goals - 03/03/18 1502      PT LONG TERM GOAL #1   Title  PT pain to be no greater than a 3/10 in her left knee to allow pt to sleep for 6 hours without difficulty.     Baseline  Able to sleep for 5 hours at a time now     Time  6    Period  Weeks    Status  On-going      PT LONG TERM GOAL #2   Title  Pt to be able to stand for over 20 mintues without having increased Lt knee pain to be able to make a meal.     Time  6    Period  Weeks    Status  On-going      PT LONG TERM GOAL #3   Title  Pt to be able to walk for 40 minutes without increased Lt knee pain to complete shopping trips.     Time  6    Status  On-going      PT LONG TERM GOAL #4   Title  Pt Lt LE mm to increase to at least a 4+/5 to be able to go up and down steps in a reciprocal manner.     Time  6    Period  Weeks  Status  Achieved      PT LONG TERM GOAL #5   Title  PT to be able to single leg stance for at least 40 seconds to allow pt to feel comfortable walking on uneven ground without her cane.     Time  6    Period  Weeks      PT LONG TERM GOAL #6   Title  PT Lt knee ROM to be below 4 degrees for extension and at least 120 degrees to allow pt to walk with a normal gait and squat without difficutty.     Time  6    Period  Weeks    Status  Partially Met            Plan - 03/05/18 1721    Clinical Impression Statement  Continued session focus with knee mobility and functional strengthening.  Pt present with minimal swelling today and improved gait mechanics.  Added sidestep for gluteal strengthening and increased height wiht step down.  Cueing to imrpove mechanics and eccentric control for  maximal quad strengthening beneftis.  AROM 5-118 degrees (was 6-120 degress last session).  Improved mechanics with squats this session.      Rehab Potential  Good    PT Frequency  3x / week    PT Duration  6 weeks    PT Treatment/Interventions  Cryotherapy;Gait training;Stair training;Functional mobility training;Therapeutic activities;Therapeutic exercise;Balance training;Neuromuscular re-education;Patient/family education;Manual techniques    PT Next Visit Plan  Continue session focus with knee mobility exercises and hamstring /gluteal strengthening.  Continue to progress functional strength as ROM has improved.      PT Home Exercise Plan  heel raises; functional squat, LAQ, Qset, heel slide and sidelying abduction; 02/22/18 - scar massage, ITB soft tissue mobs. ; 02/26/18:  standing TKE with theraband given       Patient will benefit from skilled therapeutic intervention in order to improve the following deficits and impairments:  Decreased activity tolerance, Decreased balance, Decreased range of motion, Difficulty walking, Decreased strength, Pain  Visit Diagnosis: Stiffness of left knee, not elsewhere classified  Acute pain of left knee  Difficulty in walking, not elsewhere classified     Problem List Patient Active Problem List   Diagnosis Date Noted  . S/P total knee replacement, left 01/26/18 02/09/2018  . Primary osteoarthritis of left knee   . DDD (degenerative disc disease), cervical 01/06/2018  . Dyshidrotic eczema 08/20/2015  . Intertrigo 01/31/2015  . GERD (gastroesophageal reflux disease) 01/31/2015  . Thoracic back pain 12/13/2013  . Tinea pedis 07/06/2013  . Leg cramps 01/07/2013  . Major depressive disorder, recurrent episode, unspecified 01/02/2013  . Controlled type 2 diabetes mellitus without complication, without long-term current use of insulin ([place]) 10/04/2012  . Essential hypertension, benign 10/04/2012  . Tobacco use 10/04/2012   Ihor Austin, [place];  [place]  Aldona Lento 03/05/2018, 5:26 PM  [facility] [address] [place], [place], [address] Phone: [phone]   Fax:  [phone]  Name: TANEKIA RYANS MRN: [id number] Date of Birth: 1956/03/19

## 2018-03-08 ENCOUNTER — Other Ambulatory Visit: Payer: Self-pay | Admitting: Orthopedic Surgery

## 2018-03-08 ENCOUNTER — Telehealth (HOSPITAL_COMMUNITY): Payer: Self-pay | Admitting: Physical Therapy

## 2018-03-08 ENCOUNTER — Telehealth: Payer: Self-pay | Admitting: Orthopedic Surgery

## 2018-03-08 ENCOUNTER — Ambulatory Visit (HOSPITAL_COMMUNITY): Payer: BLUE CROSS/BLUE SHIELD | Admitting: Physical Therapy

## 2018-03-08 MED ORDER — HYDROCODONE-ACETAMINOPHEN 5-325 MG PO TABS: 1.0000 | ORAL_TABLET | Freq: Four times a day (QID) | ORAL | 0 refills | Status: DC | PRN

## 2018-03-08 NOTE — Telephone Encounter (Signed)
Hydrocodone-Acetaminophen  5/325 mg  Qty 28 Tablets  Take 1 tablet by mouth every 6 (six) hours as needed for moderate pain.  PATIENT USES [facility] IN [employer].

## 2018-03-08 NOTE — Telephone Encounter (Signed)
She is not feeling well and will not be here today  °

## 2018-03-10 ENCOUNTER — Encounter: Payer: Self-pay | Admitting: Orthopedic Surgery

## 2018-03-10 ENCOUNTER — Encounter (HOSPITAL_COMMUNITY): Payer: Self-pay

## 2018-03-10 ENCOUNTER — Ambulatory Visit (HOSPITAL_COMMUNITY): Payer: BLUE CROSS/BLUE SHIELD | Attending: Orthopedic Surgery

## 2018-03-10 ENCOUNTER — Ambulatory Visit (INDEPENDENT_AMBULATORY_CARE_PROVIDER_SITE_OTHER): Payer: Self-pay | Admitting: Orthopedic Surgery

## 2018-03-10 DIAGNOSIS — R262 Difficulty in walking, not elsewhere classified: Secondary | ICD-10-CM | POA: Insufficient documentation

## 2018-03-10 DIAGNOSIS — M25562 Pain in left knee: Secondary | ICD-10-CM | POA: Diagnosis present

## 2018-03-10 DIAGNOSIS — Z96652 Presence of left artificial knee joint: Secondary | ICD-10-CM

## 2018-03-10 DIAGNOSIS — M25662 Stiffness of left knee, not elsewhere classified: Secondary | ICD-10-CM | POA: Insufficient documentation

## 2018-03-10 NOTE — Progress Notes (Signed)
Stop visit  Status post total knee on March 19  Patient complains of some soreness  Her current range of motion was 0 to 100 degrees she is walking without a cane she uses her pain medicine sparingly  Her medications continue with amlodipine she can stop the aspirin she takes intermittent hydrocodone 5 mg she takes lisinopril hydrochlorothiazide and metformin  She will continue with home exercises and follow-up in 6 weeks for final checkup

## 2018-03-10 NOTE — Therapy (Signed)
[place], [place], [address] Phone: [phone]   Fax:  [phone]  Physical Therapy Treatment  Patient Details  Name: Danielle Hicks MRN: [id number] Date of Birth: August 01, 1956 Referring Provider: Arther Abbott   AROM 5-121 degrees. Ambulated no AD through session.    Encounter Date: 03/10/2018  PT End of Session - 03/10/18 1350    Visit Number  10    Number of Visits  16    Date for PT Re-Evaluation  03/30/18 Minireassessment complete 03/03/18    Authorization Type  [employer]    Authorization Time Period  cert: 7/6-->05/29/93    Authorization - Visit Number  10    Authorization - Number of Visits  10    PT Start Time  7096    PT Stop Time  1425    PT Time Calculation (min)  40 min    Activity Tolerance  Patient tolerated treatment well    Behavior During Therapy  WFL for tasks assessed/performed       Past Medical History:  Diagnosis Date  . Arthritis   . GERD (gastroesophageal reflux disease)   . Headache   . Hypertension   . Type 2 diabetes mellitus ([place])     Past Surgical History:  Procedure Laterality Date  . ABDOMINAL HYSTERECTOMY    . BLEPHAROPLASTY Bilateral   . HEMORRHOID SURGERY    . TOTAL KNEE ARTHROPLASTY Left 01/26/2018   Procedure: TOTAL KNEE ARTHROPLASTY;  Surgeon: Carole Civil, MD;  Location: [facility];  Service: Orthopedics;  Laterality: Left;    There were no vitals filed for this visit.  Subjective Assessment - 03/10/18 1347    Subjective  Pt stated she has been on her feet a lot today, got her hair done and walking outdoors.  reports of minimal swelling on knee as well.    Patient Stated Goals  To get back walking; get back to sleeping better.     Currently in Pain?  Yes    Pain Score  3     Pain Location  Knee    Pain Orientation  Left    Pain Descriptors / Indicators  Tightness    Pain Type  Acute pain    Pain Onset  1 to 4 weeks ago    Pain Frequency  Intermittent     Aggravating Factors   standing too long    Pain Relieving Factors  massaging    Effect of Pain on Daily Activities  limits                       [facility] Adult PT Treatment/Exercise - 03/10/18 0001      Knee/Hip Exercises: Stretches   Active Hamstring Stretch  Left;3 reps;30 seconds 12in step height    Knee: Self-Stretch to increase Flexion  10 seconds    Knee: Self-Stretch Limitations  knee drive for mobility 28Z 10" on 12in step    Gastroc Stretch  3 reps;30 seconds    Gastroc Stretch Limitations  slant board      Knee/Hip Exercises: Standing   Heel Raises  20 reps incline slope wiht toe raises, cueing for mechanics    Knee Flexion  10 reps;Limitations    Knee Flexion Limitations  3#    Lateral Step Up  Left;15 reps;Hand Hold: 1;Step Height: 4"    Forward Step Up  Left;15 reps;Hand Hold: 0;Step Height: 6"    Step Down  Left;15 reps;Hand Hold:  0;Step Height: 4" increased difficulty with 6in, decreased to 4in heel tapping    Functional Squat  15 reps chair behind for strengthening.    SLS with Vectors  5x 5" no HHA    Other Standing Knee Exercises  sidestep with RTB 2RT      Knee/Hip Exercises: Supine   Short Arc Quad Sets  15 reps 2#    Bridges  20 reps    Knee Extension  AROM;Limitations    Knee Extension Limitations  5    Knee Flexion  AROM;Limitations    Knee Flexion Limitations  121      Manual Therapy   Manual Therapy  Myofascial release;Soft tissue mobilization    Manual therapy comments  Manual complete separate than rest of tx    Soft tissue mobilization  to perimeter of knee to improve tissue mobilty    Myofascial Release  scar tissue and adhesions               PT Short Term Goals - 03/03/18 1501      PT SHORT TERM GOAL #1   Title  Pain in pt Lt knee of be no greater than a 6/10 to allow pt to sleep for four hours straight.     Time  3    Period  Weeks    Status  Achieved      PT SHORT TERM GOAL #2   Title  Pt to be able to single  leg stance on both LE for 15 " to allow pt to feel confident walking without a cane.     Baseline  able to stand for greater than 15" but still using a cane     Time  3    Period  Weeks    Status  Partially Met      PT SHORT TERM GOAL #3   Title  Pt to be able to stand for 15 mintues to prepare a small meal.     Time  3    Period  Weeks    Status  Achieved      PT SHORT TERM GOAL #4   Title  PT to be able to walk for 20 minutes without increased pain to complete short shopping trips.     Time  3    Period  Weeks    Status  Achieved        PT Long Term Goals - 03/03/18 1502      PT LONG TERM GOAL #1   Title  PT pain to be no greater than a 3/10 in her left knee to allow pt to sleep for 6 hours without difficulty.     Baseline  Able to sleep for 5 hours at a time now     Time  6    Period  Weeks    Status  On-going      PT LONG TERM GOAL #2   Title  Pt to be able to stand for over 20 mintues without having increased Lt knee pain to be able to make a meal.     Time  6    Period  Weeks    Status  On-going      PT LONG TERM GOAL #3   Title  Pt to be able to walk for 40 minutes without increased Lt knee pain to complete shopping trips.     Time  6    Status  On-going      PT LONG  TERM GOAL #4   Title  Pt Lt LE mm to increase to at least a 4+/5 to be able to go up and down steps in a reciprocal manner.     Time  6    Period  Weeks    Status  Achieved      PT LONG TERM GOAL #5   Title  PT to be able to single leg stance for at least 40 seconds to allow pt to feel comfortable walking on uneven ground without her cane.     Time  6    Period  Weeks      PT LONG TERM GOAL #6   Title  PT Lt knee ROM to be below 4 degrees for extension and at least 120 degrees to allow pt to walk with a normal gait and squat without difficutty.     Time  6    Period  Weeks    Status  Partially Met            Plan - 03/10/18 1425    Clinical Impression Statement  Continued session  focus with knee mobiltiy and functional strengthening.  Added weight with hamstring curls and SAQ for strengthening.  Pt able to complete all exercises with no reports of pain through session.  Noted most difficulty descending stairs and acheiving full extension.  Increased focus with eccentric control descending stairs for quad strengthening and cueing to equalize weight bearing with functional squats, verbal and visual cueing helpful.  AROM 5-121 degrees.      Rehab Potential  Good    PT Frequency  3x / week    PT Duration  6 weeks    PT Treatment/Interventions  Cryotherapy;Gait training;Stair training;Functional mobility training;Therapeutic activities;Therapeutic exercise;Balance training;Neuromuscular re-education;Patient/family education;Manual techniques    PT Next Visit Plan  F/U with MD apt.  Continue session focus with knee mobility exercises and hamstring /gluteal strengthening.  Continue to progress functional strength as ROM has improved.      PT Home Exercise Plan  heel raises; functional squat, LAQ, Qset, heel slide and sidelying abduction; 02/22/18 - scar massage, ITB soft tissue mobs. ; 02/26/18:  standing TKE with theraband given       Patient will benefit from skilled therapeutic intervention in order to improve the following deficits and impairments:  Decreased activity tolerance, Decreased balance, Decreased range of motion, Difficulty walking, Decreased strength, Pain  Visit Diagnosis: Stiffness of left knee, not elsewhere classified  Acute pain of left knee  Difficulty in walking, not elsewhere classified     Problem List Patient Active Problem List   Diagnosis Date Noted  . S/P total knee replacement, left 01/26/18 02/09/2018  . Primary osteoarthritis of left knee   . DDD (degenerative disc disease), cervical 01/06/2018  . Dyshidrotic eczema 08/20/2015  . Intertrigo 01/31/2015  . GERD (gastroesophageal reflux disease) 01/31/2015  . Thoracic back pain 12/13/2013  .  Tinea pedis 07/06/2013  . Leg cramps 01/07/2013  . Major depressive disorder, recurrent episode, unspecified 01/02/2013  . Controlled type 2 diabetes mellitus without complication, without long-term current use of insulin ([place]) 10/04/2012  . Essential hypertension, benign 10/04/2012  . Tobacco use 10/04/2012   Ihor Austin, [place]; [place]  Aldona Lento 03/10/2018, 2:30 PM  [place] [address] [place], [place], [address] Phone: [phone]   Fax:  [phone]  Name: Danielle Hicks MRN: [id number] Date of Birth: 1956/08/03

## 2018-03-12 ENCOUNTER — Telehealth (HOSPITAL_COMMUNITY): Payer: Self-pay | Admitting: Family Medicine

## 2018-03-12 ENCOUNTER — Ambulatory Visit (HOSPITAL_COMMUNITY): Payer: BLUE CROSS/BLUE SHIELD

## 2018-03-12 NOTE — Telephone Encounter (Signed)
03/12/18  pt called to say that Dr. Aline Brochure said she didn't have to come back to theray and could be discahrged

## 2018-03-15 ENCOUNTER — Encounter (HOSPITAL_COMMUNITY): Payer: BLUE CROSS/BLUE SHIELD | Admitting: Physical Therapy

## 2018-03-17 ENCOUNTER — Other Ambulatory Visit: Payer: Self-pay | Admitting: Orthopedic Surgery

## 2018-03-17 ENCOUNTER — Encounter (HOSPITAL_COMMUNITY): Payer: Self-pay | Admitting: Physical Therapy

## 2018-03-17 ENCOUNTER — Encounter (HOSPITAL_COMMUNITY): Payer: BLUE CROSS/BLUE SHIELD | Admitting: Physical Therapy

## 2018-03-17 MED ORDER — HYDROCODONE-ACETAMINOPHEN 5-325 MG PO TABS: 1.0000 | ORAL_TABLET | Freq: Four times a day (QID) | ORAL | 0 refills | Status: DC | PRN

## 2018-03-17 NOTE — Therapy (Signed)
[place], [place], [address] Phone: [phone]   Fax:  [phone]  Patient Details  Name: Danielle Hicks MRN: [id number] Date of Birth: [birth year] Referring Provider:  No ref. provider found  Encounter Date: 03/17/2018   PHYSICAL THERAPY DISCHARGE SUMMARY  Visits from Start of Care: 10  Current functional level related to goals / functional outcomes: PT ambulating without assistive device.  ROM 5-121   Remaining deficits: Strength    Education / Equipment: HEP Plan: Patient agrees to discharge.  Patient goals were met. Patient is being discharged due to the physician's request.  ?????       Rayetta Humphrey, PT CLT [id number] 03/17/2018, 8:43 AM  [place], [place], [address] Phone: [phone]   Fax:  [phone]

## 2018-03-17 NOTE — Telephone Encounter (Signed)
Hydrocodone-Acetaminophen  5/325 mg  Qty  28 Tablets  Take 1 tablet by mouth every 6 (six) hours as needed for   moderate pain.     PATIENT USES [facility] [place]

## 2018-03-19 ENCOUNTER — Encounter (HOSPITAL_COMMUNITY): Payer: BLUE CROSS/BLUE SHIELD | Admitting: Physical Therapy

## 2018-03-29 ENCOUNTER — Telehealth (INDEPENDENT_AMBULATORY_CARE_PROVIDER_SITE_OTHER): Payer: Self-pay | Admitting: *Deleted

## 2018-03-29 ENCOUNTER — Ambulatory Visit (INDEPENDENT_AMBULATORY_CARE_PROVIDER_SITE_OTHER): Payer: BLUE CROSS/BLUE SHIELD | Admitting: Internal Medicine

## 2018-03-29 ENCOUNTER — Encounter (INDEPENDENT_AMBULATORY_CARE_PROVIDER_SITE_OTHER): Payer: Self-pay | Admitting: *Deleted

## 2018-03-29 ENCOUNTER — Encounter (INDEPENDENT_AMBULATORY_CARE_PROVIDER_SITE_OTHER): Payer: Self-pay | Admitting: Internal Medicine

## 2018-03-29 VITALS — BP 100/60 | HR 76 | Temp 98.3°F | Ht 61.0 in | Wt 161.3 lb

## 2018-03-29 DIAGNOSIS — Z0000 Encounter for general adult medical examination without abnormal findings: Secondary | ICD-10-CM | POA: Insufficient documentation

## 2018-03-29 DIAGNOSIS — Z0001 Encounter for general adult medical examination with abnormal findings: Secondary | ICD-10-CM | POA: Insufficient documentation

## 2018-03-29 DIAGNOSIS — R197 Diarrhea, unspecified: Secondary | ICD-10-CM | POA: Diagnosis not present

## 2018-03-29 DIAGNOSIS — Z1211 Encounter for screening for malignant neoplasm of colon: Secondary | ICD-10-CM | POA: Insufficient documentation

## 2018-03-29 MED ORDER — PEG 3350-KCL-NA BICARB-NACL 420 G PO SOLR: 4000.0000 mL | Freq: Once | ORAL | 0 refills | Status: AC

## 2018-03-29 NOTE — Progress Notes (Signed)
   Subjective:    Patient ID: Danielle Hicks, female    DOB: 12-17-1955, 62 y.o.   MRN: [id number]  HPI Here today for f/u. Last seen in May of 2018. Wt in May of 2018 was 182. Weight today 161.3. She just had knee surgery in March. She says she was in pain after the surgery. She doing well. She has a BM every am. No melena or BRRB. No change in her stools.  Her appetite is good.  She says her last colonoscopy was greater than 10 yrs ago.  She reports it was normal.  She says there is a family hx of colon cancer in a great grandmother (late 51s) and  and an aunt late 26s).      Review of Systems      Past Medical History:  Diagnosis Date  . Arthritis   . GERD (gastroesophageal reflux disease)   . Headache   . Hypertension   . Type 2 diabetes mellitus ([place])     Past Surgical History:  Procedure Laterality Date  . ABDOMINAL HYSTERECTOMY    . BLEPHAROPLASTY Bilateral   . HEMORRHOID SURGERY    . TOTAL KNEE ARTHROPLASTY Left 01/26/2018   Procedure: TOTAL KNEE ARTHROPLASTY;  Surgeon: Carole Civil, MD;  Location: [facility];  Service: Orthopedics;  Laterality: Left;    Allergies  Allergen Reactions  . Penicillins Other (See Comments)    Gave her a rash as a child Has patient had a PCN reaction causing immediate rash, facial/tongue/throat swelling, SOB or lightheadedness with hypotension: Unknown Has patient had a PCN reaction causing severe rash involving mucus membranes or skin necrosis: Unknown Has patient had a PCN reaction that required hospitalization: Unknown Has patient had a PCN reaction occurring within the last 10 years: No If all of the above answers are "NO", then may proceed with Cephalosporin use.     Current Outpatient Medications on File Prior to Visit  Medication Sig Dispense Refill  . amLODipine (NORVASC) 10 MG tablet take 1 tablet by mouth once daily (Patient taking differently: Take 10 mg by mouth once daily) 30 tablet 6  . HYDROcodone-acetaminophen  (NORCO/VICODIN) 5-325 MG tablet Take 1 tablet by mouth every 6 (six) hours as needed for moderate pain. 28 tablet 0  . lisinopril-hydrochlorothiazide (PRINZIDE,ZESTORETIC) 10-12.5 MG tablet take 1 tablet by mouth once daily 30 tablet 6  . metFORMIN (GLUCOPHAGE) 500 MG tablet Take 1 tablet (500 mg total) by mouth 2 (two) times daily with a meal. 60 tablet 6  . pantoprazole (PROTONIX) 40 MG tablet Take 1 tablet (40 mg total) by mouth daily. 90 tablet 3  . trolamine salicylate (ASPERCREME) 10 % cream Apply 1 application topically as needed for muscle pain.     No current facility-administered medications on file prior to visit.       Objective:   Physical Exam Blood pressure 100/60, pulse 76, temperature 98.3 F (36.8 C), height 5\' 1"  (1.549 m), weight 161 lb 4.8 oz (73.2 kg). Alert and oriented. Skin warm and dry. Oral mucosa is moist.   . Sclera anicteric, conjunctivae is pink. Thyroid not enlarged. No cervical lymphadenopathy. Lungs clear. Heart regular rate and rhythm.  Abdomen is soft. Bowel sounds are positive. No hepatomegaly. No abdominal masses felt. No tenderness.  No edema to lower extremities.  .         Assessment & Plan:  Diarrhea. She is doing well. No GI complaints.  OV in 1 year.  Screening colonoscopy.

## 2018-03-29 NOTE — Telephone Encounter (Signed)
Patient needs trilyte 

## 2018-03-29 NOTE — Patient Instructions (Signed)
The risks of bleeding, perforation and infection were reviewed with patient.  

## 2018-03-29 NOTE — Telephone Encounter (Signed)
Patient scheduled for screening TCS 05/12/18 -- she had total knee replacement in March 2019 -- will she need antibx prior to procedure -- please advise

## 2018-03-30 NOTE — Telephone Encounter (Signed)
I would like for her to talk with her orthopedic surgeon. GI guidelines do not recommend prophylactic antibiotic but will do so if her surgeon wants to

## 2018-04-06 ENCOUNTER — Other Ambulatory Visit: Payer: Self-pay | Admitting: *Deleted

## 2018-04-06 ENCOUNTER — Other Ambulatory Visit: Payer: Self-pay | Admitting: Orthopedic Surgery

## 2018-04-06 ENCOUNTER — Telehealth: Payer: Self-pay | Admitting: *Deleted

## 2018-04-06 MED ORDER — METFORMIN HCL 500 MG PO TABS: 500.0000 mg | ORAL_TABLET | Freq: Two times a day (BID) | ORAL | 3 refills | Status: DC

## 2018-04-06 MED ORDER — HYDROCODONE-ACETAMINOPHEN 5-325 MG PO TABS: 1.0000 | ORAL_TABLET | Freq: Three times a day (TID) | ORAL | 0 refills | Status: DC | PRN

## 2018-04-06 NOTE — Telephone Encounter (Signed)
Call pharmacy and verify if indeed not covered Changes to 1000mg  ER once a day

## 2018-04-06 NOTE — Telephone Encounter (Signed)
Hydrocodone-Acetaminophen 5/325 mg  Qty 28 Tablets  Take 1 tablet by mouth every 6 (six) hours as needed for moderate pain.  PATIENT USES [facility] [place]

## 2018-04-06 NOTE — Telephone Encounter (Signed)
Received fax requesting alternative to MTF.   Preferred alternatives include MTF ER.   MD please advise.

## 2018-04-06 NOTE — Telephone Encounter (Signed)
What is the reason, alternative is it cost or side effects

## 2018-04-06 NOTE — Telephone Encounter (Signed)
Medication is not covered by insurance.

## 2018-04-08 MED ORDER — METFORMIN HCL ER (OSM) 1000 MG PO TB24: 1000.0000 mg | ORAL_TABLET | Freq: Every day | ORAL | 3 refills | Status: DC

## 2018-04-08 NOTE — Telephone Encounter (Signed)
Call placed to pharmacy. Was advised that plan is rejecting MTF.   New prescription for MTF ER  Sent to pharmacy.   Call placed to patient. [place].

## 2018-04-09 NOTE — Telephone Encounter (Signed)
noted 

## 2018-04-12 MED ORDER — METFORMIN HCL 500 MG PO TABS: 500.0000 mg | ORAL_TABLET | Freq: Two times a day (BID) | ORAL | 3 refills | Status: DC

## 2018-04-12 NOTE — Telephone Encounter (Signed)
Received fax from [employer]. Was advised that [facility] is no longer in network. Prescription sent to [facility].

## 2018-04-13 ENCOUNTER — Other Ambulatory Visit (HOSPITAL_COMMUNITY): Payer: Self-pay | Admitting: Family Medicine

## 2018-04-13 DIAGNOSIS — E119 Type 2 diabetes mellitus without complications: Secondary | ICD-10-CM

## 2018-04-21 ENCOUNTER — Ambulatory Visit (INDEPENDENT_AMBULATORY_CARE_PROVIDER_SITE_OTHER): Payer: Self-pay | Admitting: Orthopedic Surgery

## 2018-04-21 ENCOUNTER — Encounter: Payer: Self-pay | Admitting: Orthopedic Surgery

## 2018-04-21 DIAGNOSIS — Z96652 Presence of left artificial knee joint: Secondary | ICD-10-CM

## 2018-04-21 NOTE — Patient Instructions (Signed)
Return to work July 1

## 2018-04-21 NOTE — Progress Notes (Signed)
Follow-up after total knee  Left total knee  March 19  She is doing well complains of soreness over the front of the knee  She is not on any hydrocodone anymore.  She is walking a mile a day  Muscular back to my first which is fine  Her knee looks good she has some tenderness over her incision at the inferior pole patella her knee range of motion is 115 degrees  She is walking without support  We will see her in 3 months for a six-month checkup

## 2018-04-26 ENCOUNTER — Other Ambulatory Visit: Payer: Self-pay | Admitting: Family Medicine

## 2018-04-26 MED ORDER — METFORMIN HCL 500 MG PO TABS: 500.0000 mg | ORAL_TABLET | Freq: Two times a day (BID) | ORAL | 3 refills | Status: DC

## 2018-04-26 NOTE — Telephone Encounter (Signed)
Pt needs refill on metformin to [facility] [place].

## 2018-04-27 MED ORDER — METFORMIN HCL ER (MOD) 1000 MG PO TB24: 1000.0000 mg | ORAL_TABLET | Freq: Every day | ORAL | 2 refills | Status: DC

## 2018-04-27 NOTE — Telephone Encounter (Signed)
Received fax requesting alternative to Metformin 500mg  SR. Insurance will not cover medication.   Formulary alternative includes Metformin XR.   MD please advise.

## 2018-04-27 NOTE — Telephone Encounter (Signed)
Changed to 1000mg  XR once a day

## 2018-04-27 NOTE — Addendum Note (Signed)
Addended by: Vic Blackbird F on: 04/27/2018 05:15 PM   Modules accepted: Orders

## 2018-04-29 ENCOUNTER — Encounter (INDEPENDENT_AMBULATORY_CARE_PROVIDER_SITE_OTHER): Payer: Self-pay | Admitting: *Deleted

## 2018-05-10 ENCOUNTER — Ambulatory Visit: Payer: BLUE CROSS/BLUE SHIELD | Admitting: Orthopedic Surgery

## 2018-05-18 ENCOUNTER — Other Ambulatory Visit (HOSPITAL_COMMUNITY): Payer: Self-pay | Admitting: Family Medicine

## 2018-05-18 DIAGNOSIS — E119 Type 2 diabetes mellitus without complications: Secondary | ICD-10-CM

## 2018-06-16 ENCOUNTER — Other Ambulatory Visit (HOSPITAL_COMMUNITY): Payer: Self-pay | Admitting: Family Medicine

## 2018-06-16 DIAGNOSIS — E119 Type 2 diabetes mellitus without complications: Secondary | ICD-10-CM

## 2018-06-17 ENCOUNTER — Ambulatory Visit (HOSPITAL_COMMUNITY)
Admission: RE | Admit: 2018-06-17 | Discharge: 2018-06-17 | Disposition: A | Discharge status: Home / Self Care | Payer: BLUE CROSS/BLUE SHIELD | Source: Ambulatory Visit | Attending: Internal Medicine | Admitting: Internal Medicine

## 2018-06-17 ENCOUNTER — Other Ambulatory Visit: Payer: Self-pay

## 2018-06-17 ENCOUNTER — Encounter (HOSPITAL_COMMUNITY)
Admission: RE | Disposition: A | Discharge status: Home / Self Care | Payer: Self-pay | Source: Ambulatory Visit | Attending: Internal Medicine

## 2018-06-17 DIAGNOSIS — Z880 Allergy status to penicillin: Secondary | ICD-10-CM | POA: Diagnosis not present

## 2018-06-17 DIAGNOSIS — F17210 Nicotine dependence, cigarettes, uncomplicated: Secondary | ICD-10-CM | POA: Diagnosis not present

## 2018-06-17 DIAGNOSIS — M1990 Unspecified osteoarthritis, unspecified site: Secondary | ICD-10-CM | POA: Insufficient documentation

## 2018-06-17 DIAGNOSIS — K219 Gastro-esophageal reflux disease without esophagitis: Secondary | ICD-10-CM | POA: Diagnosis not present

## 2018-06-17 DIAGNOSIS — Z7984 Long term (current) use of oral hypoglycemic drugs: Secondary | ICD-10-CM | POA: Insufficient documentation

## 2018-06-17 DIAGNOSIS — E119 Type 2 diabetes mellitus without complications: Secondary | ICD-10-CM | POA: Diagnosis not present

## 2018-06-17 DIAGNOSIS — Z800 Family history of malignant neoplasm of digestive organs: Secondary | ICD-10-CM | POA: Insufficient documentation

## 2018-06-17 DIAGNOSIS — K5730 Diverticulosis of large intestine without perforation or abscess without bleeding: Secondary | ICD-10-CM | POA: Insufficient documentation

## 2018-06-17 DIAGNOSIS — I10 Essential (primary) hypertension: Secondary | ICD-10-CM | POA: Insufficient documentation

## 2018-06-17 DIAGNOSIS — Z1211 Encounter for screening for malignant neoplasm of colon: Secondary | ICD-10-CM

## 2018-06-17 DIAGNOSIS — D128 Benign neoplasm of rectum: Secondary | ICD-10-CM | POA: Diagnosis not present

## 2018-06-17 DIAGNOSIS — Z0000 Encounter for general adult medical examination without abnormal findings: Secondary | ICD-10-CM | POA: Insufficient documentation

## 2018-06-17 DIAGNOSIS — K644 Residual hemorrhoidal skin tags: Secondary | ICD-10-CM

## 2018-06-17 DIAGNOSIS — K621 Rectal polyp: Secondary | ICD-10-CM

## 2018-06-17 DIAGNOSIS — Z0001 Encounter for general adult medical examination with abnormal findings: Secondary | ICD-10-CM | POA: Insufficient documentation

## 2018-06-17 HISTORY — PX: COLONOSCOPY: SHX5424

## 2018-06-17 HISTORY — PX: POLYPECTOMY: SHX5525

## 2018-06-17 LAB — GLUCOSE, CAPILLARY: Glucose-Capillary: 101 mg/dL — ABNORMAL HIGH (ref 70–99)

## 2018-06-17 SURGERY — COLONOSCOPY
Anesthesia: Moderate Sedation

## 2018-06-17 MED ORDER — MEPERIDINE HCL 50 MG/ML IJ SOLN
INTRAMUSCULAR | Status: DC | PRN
  Administered 2018-06-17 (×2): 25 mg via INTRAVENOUS

## 2018-06-17 MED ORDER — MIDAZOLAM HCL 5 MG/5ML IJ SOLN
INTRAMUSCULAR | Status: AC
  Filled 2018-06-17: qty 10

## 2018-06-17 MED ORDER — MIDAZOLAM HCL 5 MG/5ML IJ SOLN
INTRAMUSCULAR | Status: DC | PRN
  Administered 2018-06-17 (×3): 2 mg via INTRAVENOUS

## 2018-06-17 MED ORDER — SIMETHICONE 40 MG/0.6ML PO SUSP
ORAL | Status: DC | PRN
  Administered 2018-06-17: 10:00:00

## 2018-06-17 MED ORDER — MEPERIDINE HCL 50 MG/ML IJ SOLN
INTRAMUSCULAR | Status: AC
  Filled 2018-06-17: qty 1

## 2018-06-17 MED ORDER — SODIUM CHLORIDE 0.9 % IV SOLN: INTRAVENOUS | Status: DC

## 2018-06-17 NOTE — Discharge Instructions (Signed)
Diverticulosis Diverticulosis is a condition that develops when small pouches (diverticula) form in the wall of the large intestine (colon). The colon is where water is absorbed and stool is formed. The pouches form when the inside layer of the colon pushes through weak spots in the outer layers of the colon. You may have a few pouches or many of them. What are the causes? The cause of this condition is not known. What increases the risk? The following factors may make you more likely to develop this condition:  Being older than age 27. Your risk for this condition increases with age. Diverticulosis is rare among people younger than age 36. By age 28, many people have it.  Eating a low-fiber diet.  Having frequent constipation.  Being overweight.  Not getting enough exercise.  Smoking.  Taking over-the-counter pain medicines, like aspirin and ibuprofen.  Having a family history of diverticulosis.  What are the signs or symptoms? In most people, there are no symptoms of this condition. If you do have symptoms, they may include:  Bloating.  Cramps in the abdomen.  Constipation or diarrhea.  Pain in the lower left side of the abdomen.  How is this diagnosed? This condition is most often diagnosed during an exam for other colon problems. Because diverticulosis usually has no symptoms, it often cannot be diagnosed independently. This condition may be diagnosed by:  Using a flexible scope to examine the colon (colonoscopy).  Taking an X-ray of the colon after dye has been put into the colon (barium enema).  Doing a CT scan.  How is this treated? You may not need treatment for this condition if you have never developed an infection related to diverticulosis. If you have had an infection before, treatment may include:  Eating a high-fiber diet. This may include eating more fruits, vegetables, and grains.  Taking a fiber supplement.  Taking a live bacteria supplement  (probiotic).  Taking medicine to relax your colon.  Taking antibiotic medicines.  Follow these instructions at home:  Drink 6-8 glasses of water or more each day to prevent constipation.  Try not to strain when you have a bowel movement.  If you have had an infection before: ? Eat more fiber as directed by your health care provider or your diet and nutrition specialist (dietitian). ? Take a fiber supplement or probiotic, if your health care provider approves.  Take over-the-counter and prescription medicines only as told by your health care provider.  If you were prescribed an antibiotic, take it as told by your health care provider. Do not stop taking the antibiotic even if you start to feel better.  Keep all follow-up visits as told by your health care provider. This is important. Contact a health care provider if:  You have pain in your abdomen.  You have bloating.  You have cramps.  You have not had a bowel movement in 3 days. Get help right away if:  Your pain gets worse.  Your bloating becomes very bad.  You have a fever or chills, and your symptoms suddenly get worse.  You vomit.  You have bowel movements that are bloody or black.  You have bleeding from your rectum. Summary  Diverticulosis is a condition that develops when small pouches (diverticula) form in the wall of the large intestine (colon).  You may have a few pouches or many of them.  This condition is most often diagnosed during an exam for other colon problems.  If you have had an  infection related to diverticulosis, treatment may include increasing the fiber in your diet, taking supplements, or taking medicines. This information is not intended to replace advice given to you by your health care provider. Make sure you discuss any questions you have with your health care provider. Document Released: 07/24/2004 Document Revised: 09/15/2016 Document Reviewed: 09/15/2016 Elsevier Interactive  Patient Education  2017 [employer]. Colonoscopy, Adult, Care After This sheet gives you information about how to care for yourself after your procedure. Your health care provider may also give you more specific instructions. If you have problems or questions, contact your health care provider. What can I expect after the procedure? After the procedure, it is common to have:  A small amount of blood in your stool for 24 hours after the procedure.  Some gas.  Mild abdominal cramping or bloating.  Follow these instructions at home: General instructions   For the first 24 hours after the procedure: ? Do not drive or use machinery. ? Do not sign important documents. ? Do not drink alcohol. ? Do your regular daily activities at a slower pace than normal. ? Eat soft, easy-to-digest foods. ? Rest often.  Take over-the-counter or prescription medicines only as told by your health care provider.  It is up to you to get the results of your procedure. Ask your health care provider, or the department performing the procedure, when your results will be ready. Relieving cramping and bloating  Try walking around when you have cramps or feel bloated.  Apply heat to your abdomen as told by your health care provider. Use a heat source that your health care provider recommends, such as a moist heat pack or a heating pad. ? Place a towel between your skin and the heat source. ? Leave the heat on for 20-30 minutes. ? Remove the heat if your skin turns bright red. This is especially important if you are unable to feel pain, heat, or cold. You may have a greater risk of getting burned. Eating and drinking  Drink enough fluid to keep your urine clear or pale yellow.  Resume your normal diet as instructed by your health care provider. Avoid heavy or fried foods that are hard to digest.  Avoid drinking alcohol for as long as instructed by your health care provider. Contact a health care provider  if:  You have blood in your stool 2-3 days after the procedure. Get help right away if:  You have more than a small spotting of blood in your stool.  You pass large blood clots in your stool.  Your abdomen is swollen.  You have nausea or vomiting.  You have a fever.  You have increasing abdominal pain that is not relieved with medicine. This information is not intended to replace advice given to you by your health care provider. Make sure you discuss any questions you have with your health care provider. Document Released: 06/10/2004 Document Revised: 07/21/2016 Document Reviewed: 01/08/2016 Elsevier Interactive Patient Education  2018 [employer]. Colon Polyps Polyps are tissue growths inside the body. Polyps can grow in many places, including the large intestine (colon). A polyp may be a round bump or a mushroom-shaped growth. You could have one polyp or several. Most colon polyps are noncancerous (benign). However, some colon polyps can become cancerous over time. What are the causes? The exact cause of colon polyps is not known. What increases the risk? This condition is more likely to develop in people who:  Have a family history  of colon cancer or colon polyps.  Are older than 69 or older than 45 if they are African American.  Have inflammatory bowel disease, such as ulcerative colitis or Crohn disease.  Are overweight.  Smoke cigarettes.  Do not get enough exercise.  Drink too much alcohol.  Eat a diet that is: ? High in fat and red meat. ? Low in fiber.  Had childhood cancer that was treated with abdominal radiation.  What are the signs or symptoms? Most polyps do not cause symptoms. If you have symptoms, they may include:  Blood coming from your rectum when having a bowel movement.  Blood in your stool.The stool may look dark red or black.  A change in bowel habits, such as constipation or diarrhea.  How is this diagnosed? This condition is  diagnosed with a colonoscopy. This is a procedure that uses a lighted, flexible scope to look at the inside of your colon. How is this treated? Treatment for this condition involves removing any polyps that are found. Those polyps will then be tested for cancer. If cancer is found, your health care provider will talk to you about options for colon cancer treatment. Follow these instructions at home: Diet  Eat plenty of fiber, such as fruits, vegetables, and whole grains.  Eat foods that are high in calcium and vitamin D, such as milk, cheese, yogurt, eggs, liver, fish, and broccoli.  Limit foods high in fat, red meats, and processed meats, such as hot dogs, sausage, bacon, and lunch meats.  Maintain a healthy weight, or lose weight if recommended by your health care provider. General instructions  Do not smoke cigarettes.  Do not drink alcohol excessively.  Keep all follow-up visits as told by your health care provider. This is important. This includes keeping regularly scheduled colonoscopies. Talk to your health care provider about when you need a colonoscopy.  Exercise every day or as told by your health care provider. Contact a health care provider if:  You have new or worsening bleeding during a bowel movement.  You have new or increased blood in your stool.  You have a change in bowel habits.  You unexpectedly lose weight. This information is not intended to replace advice given to you by your health care provider. Make sure you discuss any questions you have with your health care provider. Document Released: 07/23/2004 Document Revised: 04/03/2016 Document Reviewed: 09/17/2015 Elsevier Interactive Patient Education  2018 [employer]. No aspirin or NSAIDs for 24 hours. Resume other medications as before. Modified carb high-fiber diet. No driving for 24 hours. Physician will call with biopsy results.

## 2018-06-17 NOTE — Op Note (Signed)
[facility] Patient Name: Danielle Hicks Procedure Date: 06/17/2018 9:29 AM MRN: [id number] Date of Birth: 01/07/1956 Attending MD: Sreshta Hicks , MD CSN: [id number] Age: 62 Admit Type: Outpatient Procedure:                Colonoscopy Indications:              Screening for colorectal malignant neoplasm Providers:                Bulah Laser, MD, Otis Peak B. Sharon Seller, RN, Nelma Rothman, Technician Referring MD:             Modena Nunnery. [place], MD Medicines:                Meperidine 50 mg IV, Midazolam 6 mg IV Complications:            No immediate complications. Estimated Blood Loss:     Estimated blood loss was minimal. Procedure:                Pre-Anesthesia Assessment:                           - Prior to the procedure, a History and Physical                            was performed, and patient medications and                            allergies were reviewed. The patient's tolerance of                            previous anesthesia was also reviewed. The risks                            and benefits of the procedure and the sedation                            options and risks were discussed with the patient.                            All questions were answered, and informed consent                            was obtained. Prior Anticoagulants: The patient                            last took naproxen 1 day prior to the procedure.                            ASA Grade Assessment: II - A patient with mild                            systemic disease. After reviewing the risks and  benefits, the patient was deemed in satisfactory                            condition to undergo the procedure.                           After obtaining informed consent, the colonoscope                            was passed under direct vision. Throughout the                            procedure, the patient's blood pressure, pulse, and                   oxygen saturations were monitored continuously. The                            [id number]-H190DL ([id number]) scope was introduced through                            the anus and advanced to the the cecum, identified                            by appendiceal orifice and ileocecal valve. The                            colonoscopy was performed without difficulty. The                            patient tolerated the procedure well. The quality                            of the bowel preparation was excellent. The                            ileocecal valve, appendiceal orifice, and rectum                            were photographed. Scope In: 9:52:24 AM Scope Out: 10:07:13 AM Scope Withdrawal Time: 0 hours 9 minutes 25 seconds  Total Procedure Duration: 0 hours 14 minutes 49 seconds  Findings:      The perianal and digital rectal examinations were normal.      A few small and medium-mouthed diverticula were found in the sigmoid       colon and hepatic flexure.      A 5 mm polyp was found in the rectum. The polyp was sessile. The polyp       was removed with a cold snare. Resection and retrieval were complete.      External hemorrhoids were found during retroflexion. The hemorrhoids       were small. Impression:               - Diverticulosis in the sigmoid colon and at the  hepatic flexure.                           - One 5 mm polyp in the rectum, removed with a cold                            snare. Resected and retrieved.                           - External hemorrhoids. Moderate Sedation:      Moderate (conscious) sedation was administered by the endoscopy nurse       and supervised by the endoscopist. The following parameters were       monitored: oxygen saturation, heart rate, blood pressure, CO2       capnography and response to care. Total physician intraservice time was       22 minutes. Recommendation:           - Patient has a contact number  available for                            emergencies. The signs and symptoms of potential                            delayed complications were discussed with the                            patient. Return to normal activities tomorrow.                            Written discharge instructions were provided to the                            patient.                           - High fiber diet and diabetic (ADA) diet today.                           - Continue present medications.                           - No aspirin, ibuprofen, naproxen, or other                            non-steroidal anti-inflammatory drugs for 1 day.                           - Await pathology results.                           - Repeat colonoscopy is recommended. The                            colonoscopy date will be determined after pathology  results from today's exam become available for                            review. Procedure Code(s):        --- Professional ---                           [id number], Colonoscopy, flexible; with removal of                            tumor(s), polyp(s), or other lesion(s) by snare                            technique                           G0500, Moderate sedation services provided by the                            same physician or other qualified health care                            professional performing a gastrointestinal                            endoscopic service that sedation supports,                            requiring the presence of an independent trained                            observer to assist in the monitoring of the                            patient's level of consciousness and physiological                            status; initial 15 minutes of intra-service time;                            patient age 37 years or older (additional time may                            be reported with [id number], as appropriate) Diagnosis  Code(s):        --- Professional ---                           Z12.11, Encounter for screening for malignant                            neoplasm of colon                           K64.4, Residual hemorrhoidal skin tags  K62.1, Rectal polyp                           K57.30, Diverticulosis of large intestine without                            perforation or abscess without bleeding CPT copyright 2017 American Medical Association. All rights reserved. The codes documented in this report are preliminary and upon coder review may  be revised to meet current compliance requirements. Danielle Laser, MD Danielle Laser, MD 06/17/2018 10:14:51 AM This report has been signed electronically. Number of Addenda: 0

## 2018-06-17 NOTE — H&P (Signed)
Danielle Hicks is an 62 y.o. female.   Chief Complaint: Patient is here for colonoscopy. HPI: Patient 62 year old Afro-American female who is here for screening colonoscopy.  This is patient's second exam.  She denies abdominal pain change in bowel habits or rectal bleeding. Her great grandmother on mother side had colon carcinoma when she was around 60 years old.  No other member of the family has had CRC.  Past Medical History:  Diagnosis Date  . Arthritis   . GERD (gastroesophageal reflux disease)   . Headache   . Hypertension   . Type 2 diabetes mellitus ([place])     Past Surgical History:  Procedure Laterality Date  . ABDOMINAL HYSTERECTOMY    . BLEPHAROPLASTY Bilateral   . HEMORRHOID SURGERY    . TOTAL KNEE ARTHROPLASTY Left 01/26/2018   Procedure: TOTAL KNEE ARTHROPLASTY;  Surgeon: Carole Civil, MD;  Location: [facility];  Service: Orthopedics;  Laterality: Left;    Family History  Problem Relation Age of Onset  . Diabetes Mother   . Hypertension Mother   . Stroke Sister    Social History:  reports that she has been smoking cigarettes. She has never used smokeless tobacco. She reports that she does not drink alcohol or use drugs.  Allergies:  Allergies  Allergen Reactions  . Penicillins Rash     Has patient had a PCN reaction causing immediate rash, facial/tongue/throat swelling, SOB or lightheadedness with hypotension: Yes Has patient had a PCN reaction causing severe rash involving mucus membranes or skin necrosis: No Has patient had a PCN reaction that required hospitalization: No Has patient had a PCN reaction occurring within the last 10 years: No If all of the above answers are "NO", then may proceed with Cephalosporin use.     Medications Prior to Admission  Medication Sig Dispense Refill  . amLODipine (NORVASC) 10 MG tablet TAKE 1 TABLET BY MOUTH ONCE DAILY 30 tablet 0  . lisinopril-hydrochlorothiazide (PRINZIDE,ZESTORETIC) 10-12.5 MG tablet TAKE 1  TABLET BY MOUTH ONCE DAILY 30 tablet 0  . metFORMIN (GLUCOPHAGE) 500 MG tablet Take 500 mg by mouth 2 (two) times daily with a meal.    . Multiple Vitamin (MULTIVITAMIN WITH MINERALS) TABS tablet Take 1 tablet by mouth 4 (four) times a week.    . naproxen sodium (ALEVE) 220 MG tablet Take 440 mg by mouth daily as needed (pain).    . pantoprazole (PROTONIX) 40 MG tablet Take 1 tablet (40 mg total) by mouth daily. 90 tablet 3  . Potassium 99 MG TABS Take 99 mg by mouth 4 (four) times a week.    . trolamine salicylate (ASPERCREME) 10 % cream Apply 1 application topically as needed (knee pain).     . metFORMIN (GLUMETZA) 1000 MG (MOD) 24 hr tablet Take 1 tablet (1,000 mg total) by mouth daily with breakfast. (Patient not taking: Reported on 06/14/2018) 90 tablet 2    Results for orders placed or performed during the hospital encounter of 06/17/18 (from the past 48 hour(s))  Glucose, capillary     Status: Abnormal   Collection Time: 06/17/18  8:47 AM  Result Value Ref Range   Glucose-Capillary 101 (H) 70 - 99 mg/dL   No results found.  ROS  Blood pressure 137/86, pulse (!) 57, temperature 98.5 F (36.9 C), temperature source Oral, resp. rate 19, SpO2 100 %. Physical Exam  Constitutional: She appears well-developed and well-nourished.  HENT:  Mouth/Throat: Oropharynx is clear and moist.  Eyes: Conjunctivae are normal. No  scleral icterus.  Neck: No thyromegaly present.  Cardiovascular: Normal rate, regular rhythm and normal heart sounds.  No murmur heard. Respiratory: Effort normal and breath sounds normal.  GI:  Abdomen is symmetrical soft and nontender.  Pfannenstiel scar noted.  No organomegaly or masses.  Musculoskeletal: She exhibits no edema.  Lymphadenopathy:    She has no cervical adenopathy.  Neurological: She is alert.  Skin: Skin is warm and dry.     Assessment/Plan Average risk screening colonoscopy.  Shar Laser, MD 06/17/2018, 9:40 AM

## 2018-06-22 ENCOUNTER — Encounter (HOSPITAL_COMMUNITY): Payer: Self-pay | Admitting: Internal Medicine

## 2018-07-18 ENCOUNTER — Other Ambulatory Visit (HOSPITAL_COMMUNITY): Payer: Self-pay | Admitting: Family Medicine

## 2018-07-18 DIAGNOSIS — E119 Type 2 diabetes mellitus without complications: Secondary | ICD-10-CM

## 2018-07-21 ENCOUNTER — Ambulatory Visit: Payer: BLUE CROSS/BLUE SHIELD | Admitting: Orthopedic Surgery

## 2018-07-27 ENCOUNTER — Encounter: Payer: BLUE CROSS/BLUE SHIELD | Admitting: Family Medicine

## 2018-08-04 ENCOUNTER — Ambulatory Visit (INDEPENDENT_AMBULATORY_CARE_PROVIDER_SITE_OTHER): Payer: BLUE CROSS/BLUE SHIELD | Admitting: Orthopedic Surgery

## 2018-08-04 ENCOUNTER — Encounter: Payer: Self-pay | Admitting: Orthopedic Surgery

## 2018-08-04 VITALS — BP 123/85 | HR 68 | Ht 61.0 in | Wt 171.0 lb

## 2018-08-04 DIAGNOSIS — Z96652 Presence of left artificial knee joint: Secondary | ICD-10-CM

## 2018-08-04 NOTE — Progress Notes (Signed)
Chief Complaint  Patient presents with  . Post-op Follow-up    left total knee replacement 01/26/18   6 [facility] FOLLOW UP   No problems or complaints regarding left knee she has excellent flexion no pain no assistive devices needed she works 7 days last week says she is happy with her knee  Routine follow-up for 1 year anniversary x-ray

## 2018-08-20 ENCOUNTER — Encounter: Payer: Self-pay | Admitting: Family Medicine

## 2018-08-20 ENCOUNTER — Other Ambulatory Visit (HOSPITAL_COMMUNITY): Payer: Self-pay | Admitting: Family Medicine

## 2018-08-20 ENCOUNTER — Other Ambulatory Visit: Payer: Self-pay

## 2018-08-20 ENCOUNTER — Ambulatory Visit: Payer: BLUE CROSS/BLUE SHIELD | Admitting: Family Medicine

## 2018-08-20 VITALS — BP 128/70 | HR 70 | Temp 98.6°F | Resp 16 | Ht 61.0 in | Wt 168.0 lb

## 2018-08-20 DIAGNOSIS — E119 Type 2 diabetes mellitus without complications: Secondary | ICD-10-CM

## 2018-08-20 DIAGNOSIS — L299 Pruritus, unspecified: Secondary | ICD-10-CM | POA: Diagnosis not present

## 2018-08-20 DIAGNOSIS — R21 Rash and other nonspecific skin eruption: Secondary | ICD-10-CM | POA: Diagnosis not present

## 2018-08-20 MED ORDER — PREDNISONE 20 MG PO TABS: ORAL_TABLET | ORAL | 0 refills | Status: DC

## 2018-08-20 MED ORDER — VALACYCLOVIR HCL 1 G PO TABS: 1000.0000 mg | ORAL_TABLET | Freq: Three times a day (TID) | ORAL | 0 refills | Status: AC

## 2018-08-20 MED ORDER — HYDROXYZINE HCL 10 MG PO TABS: 10.0000 mg | ORAL_TABLET | Freq: Three times a day (TID) | ORAL | 0 refills | Status: DC | PRN

## 2018-08-20 NOTE — Patient Instructions (Addendum)
Can try itching medicines and steroids for symptoms. Change back detergents to old soaps you did not have problems with or to something sensitive for skin. Hydrate skin.   If itching is mild I would only take a zyrtec or allegra for 24 hours coverage of itching and histamine reaction/allergies   If you develop ONE SIDED - raised red bumps or fluid filled blisters then start the valtrex.  If its both sides you do not need to fill and take the valtrex    Rash A rash is a change in the color of the skin. A rash can also change the way your skin feels. There are many different conditions and factors that can cause a rash. Follow these instructions at home: Pay attention to any changes in your symptoms. Follow these instructions to help with your condition: Medicine Take or apply over-the-counter and prescription medicines only as told by your doctor. These may include:  Corticosteroid cream.  Anti-itch lotions.  Oral antihistamines.  Skin Care  Put cool compresses on the affected areas.  Try taking a bath with: ? Epsom salts. Follow the instructions on the packaging. You can get these at your local pharmacy or grocery store. ? Baking soda. Pour a small amount into the bath as told by your doctor. ? Colloidal oatmeal. Follow the instructions on the packaging. You can get this at your local pharmacy or grocery store.  Try putting baking soda paste onto your skin. Stir water into baking soda until it gets like a paste.  Do not scratch or rub your skin.  Avoid covering the rash. Make sure the rash is exposed to air as much as possible. General instructions  Avoid hot showers or baths, which can make itching worse. A cold shower may help.  Avoid scented soaps, detergents, and perfumes. Use gentle soaps, detergents, perfumes, and other cosmetic products.  Avoid anything that causes your rash. Keep a journal to help track what causes your rash. Write down: ? What you eat. ? What  cosmetic products you use. ? What you drink. ? What you wear. This includes jewelry.  Keep all follow-up visits as told by your doctor. This is important. Contact a doctor if:  You sweat at night.  You lose weight.  You pee (urinate) more than normal.  You feel weak.  You throw up (vomit).  Your skin or the whites of your eyes look yellow (jaundice).  Your skin: ? Tingles. ? Is numb.  Your rash: ? Does not go away after a few days. ? Gets worse.  You are: ? More thirsty than normal. ? More tired than normal.  You have: ? New symptoms. ? Pain in your belly (abdomen). ? A fever. ? Watery poop (diarrhea). Get help right away if:  Your rash covers all or most of your body. The rash may or may not be painful.  You have blisters that: ? Are on top of the rash. ? Grow larger. ? Grow together. ? Are painful. ? Are inside your nose or mouth.  You have a rash that: ? Looks like purple pinprick-sized spots all over your body. ? Has a "bull's eye" or looks like a target. ? Is red and painful, causes your skin to peel, and is not from being in the sun too long. This information is not intended to replace advice given to you by your health care provider. Make sure you discuss any questions you have with your health care provider. Document Released: 04/14/2008 Document  Revised: 04/03/2016 Document Reviewed: 03/14/2015 Elsevier Interactive Patient Education  2018 [employer]  

## 2018-08-20 NOTE — Progress Notes (Signed)
Patient ID: Danielle Hicks, female    DOB: [birth year], 62 y.o.   MRN: [id number]  PCP: Alycia Rossetti, MD  Chief Complaint  Patient presents with  . Skin Irritation    peeling of hands  . Pain on R Side    burning ithcing feeling to shoulder around under axilla and into side of R breast- no irritation noted- tenderness to R breast    Subjective:   Danielle Hicks is a 62 y.o. female, presents to clinic with CC of rash scattered to all extremities and her torso and groin area, itchy and burning raised and red, had gradual onset and gradual worsening for more than 1 week.  She notes that she had gone a vacation to the beach around when it started she also had change in soaps and detergents including laundry detergent in her body wash that 1/2 weeks ago.  She has most severe pain under her right breast wrapping around her side however there is no associated rash in that area.  Her husband does not have any similar rash, she does not have any other symptoms that she is URI symptoms, sore throat, coughing, swelling in her mouth lips or airway, no coughing or wheeze.   Patient Active Problem List   Diagnosis Date Noted  . Encounter for screening colonoscopy 03/29/2018  . S/P total knee replacement, left 01/26/18 02/09/2018  . Primary osteoarthritis of left knee   . DDD (degenerative disc disease), cervical 01/06/2018  . Dyshidrotic eczema 08/20/2015  . Intertrigo 01/31/2015  . GERD (gastroesophageal reflux disease) 01/31/2015  . Thoracic back pain 12/13/2013  . Tinea pedis 07/06/2013  . Leg cramps 01/07/2013  . Major depressive disorder, recurrent episode, unspecified 01/02/2013  . Controlled type 2 diabetes mellitus without complication, without long-term current use of insulin ([place]) 10/04/2012  . Essential hypertension, benign 10/04/2012  . Tobacco use 10/04/2012     Prior to Admission medications   Medication Sig Start Date End Date Taking? Authorizing Provider    amLODipine (NORVASC) 10 MG tablet TAKE 1 TABLET BY MOUTH ONCE DAILY 08/20/18  Yes [place], Modena Nunnery, MD  lisinopril-hydrochlorothiazide (PRINZIDE,ZESTORETIC) 10-12.5 MG tablet TAKE 1 TABLET BY MOUTH ONCE DAILY 08/20/18  Yes [place], Modena Nunnery, MD  metFORMIN (GLUCOPHAGE) 500 MG tablet Take 500 mg by mouth 2 (two) times daily with a meal.   Yes [provider]  Multiple Vitamin (MULTIVITAMIN WITH MINERALS) TABS tablet Take 1 tablet by mouth 4 (four) times a week.   Yes [provider]  naproxen sodium (ALEVE) 220 MG tablet Take 440 mg by mouth daily as needed (pain).   Yes [provider]  pantoprazole (PROTONIX) 40 MG tablet Take 1 tablet (40 mg total) by mouth daily. 12/10/17  Yes [place], Modena Nunnery, MD  Potassium 99 MG TABS Take 99 mg by mouth 4 (four) times a week.   Yes [provider]  trolamine salicylate (ASPERCREME) 10 % cream Apply 1 application topically as needed (knee pain).    Yes [provider]     Allergies  Allergen Reactions  . Penicillins Rash     Has patient had a PCN reaction causing immediate rash, facial/tongue/throat swelling, SOB or lightheadedness with hypotension: Yes Has patient had a PCN reaction causing severe rash involving mucus membranes or skin necrosis: No Has patient had a PCN reaction that required hospitalization: No Has patient had a PCN reaction occurring within the last 10 years: No If all of the above answers are "  NO", then may proceed with Cephalosporin use.      Family History  Problem Relation Age of Onset  . Diabetes Mother   . Hypertension Mother   . Stroke Sister      Social History   Socioeconomic History  . Marital status: Married    Spouse name: Not on file  . Number of children: Not on file  . Years of education: Not on file  . Highest education level: Not on file  Occupational History  . Not on file  Social Needs  . Financial resource strain: Not on file  . Food insecurity:     Worry: Not on file    Inability: Not on file  . Transportation needs:    Medical: Not on file    Non-medical: Not on file  Tobacco Use  . Smoking status: Current Some Day Smoker    Types: Cigarettes  . Smokeless tobacco: Never Used  . Tobacco comment: trying to quit   Substance and Sexual Activity  . Alcohol use: No    Alcohol/week: 0.0 standard drinks  . Drug use: No  . Sexual activity: Yes  Lifestyle  . Physical activity:    Days per week: Not on file    Minutes per session: Not on file  . Stress: Not on file  Relationships  . Social connections:    Talks on phone: Not on file    Gets together: Not on file    Attends religious service: Not on file    Active member of club or organization: Not on file    Attends meetings of clubs or organizations: Not on file    Relationship status: Not on file  . Intimate partner violence:    Fear of current or ex partner: Not on file    Emotionally abused: Not on file    Physically abused: Not on file    Forced sexual activity: Not on file  Other Topics Concern  . Not on file  Social History Narrative  . Not on file     Review of Systems  Constitutional: Negative.   HENT: Negative.   Eyes: Negative.   Respiratory: Negative.   Cardiovascular: Negative.   Gastrointestinal: Negative.   Endocrine: Negative.   Genitourinary: Negative.   Musculoskeletal: Negative.   Skin: Negative.   Allergic/Immunologic: Negative.   Neurological: Negative.   Hematological: Negative.   Psychiatric/Behavioral: Negative.   All other systems reviewed and are negative.      Objective:    Vitals:   08/20/18 1607  BP: 128/70  Pulse: 70  Resp: 16  Temp: 98.6 F (37 C)  TempSrc: Oral  SpO2: 99%  Weight: 168 lb (76.2 kg)  Height: 5\' 1"  (1.549 m)      Physical Exam  Constitutional: She appears well-developed.  HENT:  Head: Normocephalic and atraumatic.  Nose: Nose normal.  Eyes: Conjunctivae are normal. Right eye exhibits no  discharge. Left eye exhibits no discharge.  Neck: No tracheal deviation present.  Cardiovascular: Normal rate and regular rhythm.  Pulmonary/Chest: Effort normal. No stridor. No respiratory distress.  Musculoskeletal: Normal range of motion.  Neurological: She is alert. She exhibits normal muscle tone. Coordination normal.  Skin: Skin is warm and dry. Rash noted.  Scattered erythematous papular rash to all extremities to chest, abdomen.  No rash noted on the right breast wrapping around her side where she points to area of pain and irritation  Psychiatric: She has a normal mood and affect. Her behavior is normal.  Nursing note and vitals reviewed.         Assessment & Plan:      ICD-10-CM   1. Rash and nonspecific skin eruption R21 predniSONE (DELTASONE) 20 MG tablet    hydrOXYzine (ATARAX/VISTARIL) 10 MG tablet  2. Pruritic condition L29.9 predniSONE (DELTASONE) 20 MG tablet    hydrOXYzine (ATARAX/VISTARIL) 10 MG tablet    Patient with scattered rash onset over the past week and a half, does appear to be closely related to areas of contact with clothing and some slightly more suspicious of a contact dermatitis with her detergent change however she has had multiple soaps changed and also traveled on vacation and came back home during the time of onset of this rash.  She was concerned about possible shingles to her right breast but there is no rash there and her symptoms are not unilateral.    We will treat with steroids, hydroxyzine.  Valtrex scription printed for the patient for her to hold I would be very surprised if she developed shingles where she is having this burning pain in addition to this other diffuse scattered rash.  We reviewed the look of the vesicles and rash and she feels well acquainted with this and will hold the Valtrex and only fill if she has a rash suspicious for shingles   Delsa Grana, PA-C 08/20/18 4:10 PM

## 2018-09-03 ENCOUNTER — Telehealth: Payer: Self-pay | Admitting: *Deleted

## 2018-09-03 NOTE — Telephone Encounter (Signed)
Switch to glucophage xr 1000 mg poqam

## 2018-09-03 NOTE — Telephone Encounter (Signed)
Received fax requesting alternative to Metformin.   Reports that plan prefers Metformin ER.   Please advise.

## 2018-09-03 NOTE — Telephone Encounter (Signed)
Call placed to patient. LMTRC.  

## 2018-09-09 NOTE — Telephone Encounter (Signed)
Call placed to patient and patient made aware per VM.  

## 2018-09-16 ENCOUNTER — Other Ambulatory Visit (HOSPITAL_COMMUNITY): Payer: Self-pay | Admitting: Family Medicine

## 2018-09-16 DIAGNOSIS — E119 Type 2 diabetes mellitus without complications: Secondary | ICD-10-CM

## 2018-09-18 ENCOUNTER — Emergency Department (HOSPITAL_COMMUNITY)
Admission: EM | Admit: 2018-09-18 | Discharge: 2018-09-18 | Disposition: A | Discharge status: Home / Self Care | Payer: BLUE CROSS/BLUE SHIELD | Attending: Emergency Medicine | Admitting: Emergency Medicine

## 2018-09-18 ENCOUNTER — Emergency Department (HOSPITAL_COMMUNITY): Payer: BLUE CROSS/BLUE SHIELD

## 2018-09-18 ENCOUNTER — Encounter (HOSPITAL_COMMUNITY): Payer: Self-pay | Admitting: Emergency Medicine

## 2018-09-18 ENCOUNTER — Other Ambulatory Visit: Payer: Self-pay

## 2018-09-18 DIAGNOSIS — Z96652 Presence of left artificial knee joint: Secondary | ICD-10-CM | POA: Diagnosis not present

## 2018-09-18 DIAGNOSIS — M25511 Pain in right shoulder: Secondary | ICD-10-CM

## 2018-09-18 DIAGNOSIS — L299 Pruritus, unspecified: Secondary | ICD-10-CM | POA: Diagnosis not present

## 2018-09-18 DIAGNOSIS — Z7984 Long term (current) use of oral hypoglycemic drugs: Secondary | ICD-10-CM | POA: Diagnosis not present

## 2018-09-18 DIAGNOSIS — R739 Hyperglycemia, unspecified: Secondary | ICD-10-CM

## 2018-09-18 DIAGNOSIS — F17210 Nicotine dependence, cigarettes, uncomplicated: Secondary | ICD-10-CM | POA: Diagnosis not present

## 2018-09-18 DIAGNOSIS — I10 Essential (primary) hypertension: Secondary | ICD-10-CM | POA: Insufficient documentation

## 2018-09-18 DIAGNOSIS — E1165 Type 2 diabetes mellitus with hyperglycemia: Secondary | ICD-10-CM | POA: Diagnosis present

## 2018-09-18 DIAGNOSIS — Z79899 Other long term (current) drug therapy: Secondary | ICD-10-CM | POA: Insufficient documentation

## 2018-09-18 LAB — URINALYSIS, ROUTINE W REFLEX MICROSCOPIC
Bilirubin Urine: NEGATIVE
Glucose, UA: NEGATIVE mg/dL
Hgb urine dipstick: NEGATIVE
Ketones, ur: NEGATIVE mg/dL
Leukocytes, UA: NEGATIVE
Nitrite: NEGATIVE
Protein, ur: NEGATIVE mg/dL
Specific Gravity, Urine: 1.014 (ref 1.005–1.030)
pH: 5 (ref 5.0–8.0)

## 2018-09-18 LAB — BASIC METABOLIC PANEL
Anion gap: 8 (ref 5–15)
BUN: 10 mg/dL (ref 6–20)
CO2: 26 mmol/L (ref 22–32)
Calcium: 9.3 mg/dL (ref 8.9–10.3)
Chloride: 105 mmol/L (ref 98–111)
Creatinine, Ser: 0.84 mg/dL (ref 0.44–1.00)
GFR calc Af Amer: >60 mL/min (ref >60)
GFR calc non Af Amer: >60 mL/min (ref >60)
Glucose, Bld: 200 mg/dL — ABNORMAL HIGH (ref 70–99)
Potassium: 3 mmol/L — ABNORMAL LOW (ref 3.5–5.1)
Sodium: 139 mmol/L (ref 135–145)

## 2018-09-18 LAB — HEPATIC FUNCTION PANEL
ALT: 15 U/L (ref 0–44)
AST: 16 U/L (ref 15–41)
Albumin: 3.9 g/dL (ref 3.5–5.0)
Alkaline Phosphatase: 72 U/L (ref 38–126)
Bilirubin, Direct: 0.1 mg/dL (ref 0.0–0.2)
Indirect Bilirubin: 0.6 mg/dL (ref 0.3–0.9)
Total Bilirubin: 0.7 mg/dL (ref 0.3–1.2)
Total Protein: 7.2 g/dL (ref 6.5–8.1)

## 2018-09-18 LAB — CBC
HCT: 36.5 % (ref 36.0–46.0)
Hemoglobin: 11.5 g/dL — ABNORMAL LOW (ref 12.0–15.0)
MCH: 26 pg (ref 26.0–34.0)
MCHC: 31.5 g/dL (ref 30.0–36.0)
MCV: 82.4 fL (ref 80.0–100.0)
Platelets: 467 10*3/uL — ABNORMAL HIGH (ref 150–400)
RBC: 4.43 MIL/uL (ref 3.87–5.11)
RDW: 16.7 % — ABNORMAL HIGH (ref 11.5–15.5)
WBC: 9.9 10*3/uL (ref 4.0–10.5)
nRBC: 0 % (ref 0.0–0.2)

## 2018-09-18 LAB — CBG MONITORING, ED: Glucose-Capillary: 229 mg/dL — ABNORMAL HIGH (ref 70–99)

## 2018-09-18 MED ORDER — POTASSIUM CHLORIDE CRYS ER 20 MEQ PO TBCR
40.0000 meq | EXTENDED_RELEASE_TABLET | Freq: Once | ORAL | Status: AC
  Administered 2018-09-18: 40 meq via ORAL
  Filled 2018-09-18: qty 2

## 2018-09-18 MED ORDER — HYDROXYZINE HCL 25 MG PO TABS
25.0000 mg | ORAL_TABLET | Freq: Once | ORAL | Status: AC
Start: 2018-09-18 — End: 2018-09-18
  Administered 2018-09-18: 25 mg via ORAL
  Filled 2018-09-18: qty 1

## 2018-09-18 MED ORDER — HYDROXYZINE HCL 25 MG PO TABS: 25.0000 mg | ORAL_TABLET | Freq: Three times a day (TID) | ORAL | 0 refills | Status: DC | PRN

## 2018-09-18 NOTE — ED Notes (Signed)
Patient transported to X-ray 

## 2018-09-18 NOTE — ED Provider Notes (Signed)
[facility] EMERGENCY DEPARTMENT Provider Note   CSN: [id number] Arrival date & time: 09/18/18  1056     History   Chief Complaint Chief Complaint  Patient presents with  . Hyperglycemia    HPI Danielle Hicks is a 62 y.o. female.   Hyperglycemia    Pt was seen at 1155. Per pt, c/o gradual onset and persistence of constant "high blood sugars" and "generalized itching" for the past 2 to 3 weeks. Pt states her CBG's have been "in the 200's." Pt was evaluated by her PMD last month for this complaint, rx prednisone and atarax. Pt states those medications improved her symptoms. Pt also states she is having right shoulder and biceps area "aching" pain for the past "while." States she is right handed and performs repetitive movements with her RUE at her job. Pain worsens with palpation of the area and movement.  Denies rash, no abd pain, no N/V/D, no CP/SOB, no cough, no fevers, no back or neck pain, no focal motor weakness, no tingling/numbness in extremities.    Past Medical History:  Diagnosis Date  . Arthritis   . GERD (gastroesophageal reflux disease)   . Headache   . Hypertension   . Type 2 diabetes mellitus [facility])     Patient Active Problem List   Diagnosis Date Noted  . Encounter for screening colonoscopy 03/29/2018  . S/P total knee replacement, left 01/26/18 02/09/2018  . Primary osteoarthritis of left knee   . DDD (degenerative disc disease), cervical 01/06/2018  . Dyshidrotic eczema 08/20/2015  . Intertrigo 01/31/2015  . GERD (gastroesophageal reflux disease) 01/31/2015  . Thoracic back pain 12/13/2013  . Tinea pedis 07/06/2013  . Leg cramps 01/07/2013  . Major depressive disorder, recurrent episode, unspecified 01/02/2013  . Controlled type 2 diabetes mellitus without complication, without long-term current use of insulin ([place]) 10/04/2012  . Essential hypertension, benign 10/04/2012  . Tobacco use 10/04/2012    Past Surgical History:  Procedure Laterality  Date  . ABDOMINAL HYSTERECTOMY    . BLEPHAROPLASTY Bilateral   . COLONOSCOPY N/A 06/17/2018   Procedure: COLONOSCOPY;  Surgeon: Rogene Houston, MD;  Location: [facility] ENDO SUITE;  Service: Endoscopy;  Laterality: N/A;  12:25  . HEMORRHOID SURGERY    . POLYPECTOMY  06/17/2018   Procedure: POLYPECTOMY;  Surgeon: Rogene Houston, MD;  Location: [facility] ENDO SUITE;  Service: Endoscopy;;  colon  . TOTAL KNEE ARTHROPLASTY Left 01/26/2018   Procedure: TOTAL KNEE ARTHROPLASTY;  Surgeon: Carole Civil, MD;  Location: [facility] ORS;  Service: Orthopedics;  Laterality: Left;     OB History   None      Home Medications    Prior to Admission medications   Medication Sig Start Date End Date Taking? Authorizing Provider  amLODipine (NORVASC) 10 MG tablet TAKE 1 TABLET BY MOUTH ONCE DAILY 09/16/18   Alycia Rossetti, MD  hydrOXYzine (ATARAX/VISTARIL) 10 MG tablet Take 1-2 tablets (10-20 mg total) by mouth 3 (three) times daily as needed for itching. 08/20/18   Delsa Grana, PA-C  lisinopril-hydrochlorothiazide (PRINZIDE,ZESTORETIC) 10-12.5 MG tablet TAKE 1 TABLET BY MOUTH ONCE DAILY 09/16/18   Alycia Rossetti, MD  metFORMIN (GLUCOPHAGE) 500 MG tablet Take 500 mg by mouth 2 (two) times daily with a meal.    [provider]  Multiple Vitamin (MULTIVITAMIN WITH MINERALS) TABS tablet Take 1 tablet by mouth 4 (four) times a week.    [provider]  naproxen sodium (ALEVE) 220 MG tablet Take 440 mg by mouth  daily as needed (pain).    [provider]  pantoprazole (PROTONIX) 40 MG tablet Take 1 tablet (40 mg total) by mouth daily. 12/10/17   Alycia Rossetti, MD  Potassium 99 MG TABS Take 99 mg by mouth 4 (four) times a week.    [provider]  predniSONE (DELTASONE) 20 MG tablet 2 tabs poqday 1-3, 1 tabs poqday 4-6 08/20/18   Delsa Grana, PA-C  trolamine salicylate (ASPERCREME) 10 % cream Apply 1 application topically as needed (knee pain).     [provider]     Family History Family History  Problem Relation Age of Onset  . Diabetes Mother   . Hypertension Mother   . Stroke Sister     Social History Social History   Tobacco Use  . Smoking status: Current Some Day Smoker    Types: Cigarettes  . Smokeless tobacco: Never Used  . Tobacco comment: trying to quit   Substance Use Topics  . Alcohol use: No    Alcohol/week: 0.0 standard drinks  . Drug use: No     Allergies   Penicillins   Review of Systems Review of Systems ROS: Statement: All systems negative except as marked or noted in the HPI; Constitutional: Negative for fever and chills. ; ; Eyes: Negative for eye pain, redness and discharge. ; ; ENMT: Negative for ear pain, hoarseness, nasal congestion, sinus pressure and sore throat. ; ; Cardiovascular: Negative for chest pain, palpitations, diaphoresis, dyspnea and peripheral edema. ; ; Respiratory: Negative for cough, wheezing and stridor. ; ; Gastrointestinal: Negative for nausea, vomiting, diarrhea, abdominal pain, blood in stool, hematemesis, jaundice and rectal bleeding. . ; ; Genitourinary: Negative for dysuria, flank pain and hematuria. ; ; Musculoskeletal: +right shoulder pain. Negative for back pain and neck pain. Negative for swelling and trauma.; ; Skin: +generalized itching. Negative for rash, abrasions, blisters, bruising and skin lesion.; ; Neuro: Negative for headache, lightheadedness and neck stiffness. Negative for weakness, altered level of consciousness, altered mental status, extremity weakness, paresthesias, involuntary movement, seizure and syncope.       Physical Exam Updated Vital Signs BP 131/90   Pulse 87   Temp 99 F (37.2 C) (Oral)   Resp 16   Ht 5\' 1"  (1.549 m)   Wt 76.2 kg   SpO2 99%   BMI 31.74 kg/m   Physical Exam 1200: Physical examination:  Nursing notes reviewed; Vital signs and O2 SAT reviewed;  Constitutional: Well developed, Well nourished, Well hydrated, In no acute distress;  Head:  Normocephalic, atraumatic; Eyes: EOMI, PERRL, No scleral icterus; ENMT: Mouth and pharynx normal, Mucous membranes moist; Neck: Supple, Full range of motion, No lymphadenopathy; Cardiovascular: Regular rate and rhythm, No gallop; Respiratory: Breath sounds clear & equal bilaterally, No wheezes.  Speaking full sentences with ease, Normal respiratory effort/excursion; Chest: Nontender, Movement normal; Abdomen: Soft, Nontender, Nondistended, Normal bowel sounds; Genitourinary: No CVA tenderness; Spine:  No midline CS, TS, LS tenderness.;;  Extremities:  Peripheral pulses normal. RUE muscles compartments soft. +mild TTP generalized right shoulder and biceps area, no rash. Right shoulder w/FROM.  Right clavicle NT, scapula NT, proximal humerus NT, biceps tendon NT over bicipital groove.  Motor strength at shoulder normal.  Sensation intact over deltoid region, distal NMS intact with right hand having intact and equal sensation and strength in the distribution of the median, radial, and ulnar nerve function compared to opposite side.  Strong radial pulse.  +FROM right elbow with intact motor strength biceps and triceps muscles  to resistance. NT right elbow/wrist/hand. No deformity.  No edema, No calf edema or asymmetry.; Neuro: AA&Ox3, Major CN grossly intact.  Speech clear. No gross focal motor or sensory deficits in extremities.; Skin: Color normal, Warm, Dry.   ED Treatments / Results  Labs (all labs ordered are listed, but only abnormal results are displayed)   EKG None  Radiology   Procedures Procedures (including critical care time)  Medications Ordered in ED Medications  hydrOXYzine (ATARAX/VISTARIL) tablet 25 mg (25 mg Oral Given 09/18/18 1210)  potassium chloride SA (K-DUR,KLOR-CON) CR tablet 40 mEq (40 mEq Oral Given 09/18/18 1211)     Initial Impression / Assessment and Plan / ED Course  I have reviewed the triage vital signs and the nursing notes.  Pertinent labs & imaging  results that were available during my care of the patient were reviewed by me and considered in my medical decision making (see chart for details).  MDM Reviewed: previous chart, nursing note and vitals Reviewed previous: labs Interpretation: labs and x-ray   Results for orders placed or performed during the hospital encounter of 40/98/11  Basic metabolic panel  Result Value Ref Range   Sodium 139 135 - 145 mmol/L   Potassium 3.0 (L) 3.5 - 5.1 mmol/L   Chloride 105 98 - 111 mmol/L   CO2 26 22 - 32 mmol/L   Glucose, Bld 200 (H) 70 - 99 mg/dL   BUN 10 6 - 20 mg/dL   Creatinine, Ser 0.84 0.44 - 1.00 mg/dL   Calcium 9.3 8.9 - 10.3 mg/dL   GFR calc non Af Amer >60 >60 mL/min   GFR calc Af Amer >60 >60 mL/min   Anion gap 8 5 - 15  CBC  Result Value Ref Range   WBC 9.9 4.0 - 10.5 K/uL   RBC 4.43 3.87 - 5.11 MIL/uL   Hemoglobin 11.5 (L) 12.0 - 15.0 g/dL   HCT 36.5 36.0 - 46.0 %   MCV 82.4 80.0 - 100.0 fL   MCH 26.0 26.0 - 34.0 pg   MCHC 31.5 30.0 - 36.0 g/dL   RDW 16.7 (H) 11.5 - 15.5 %   Platelets 467 (H) 150 - 400 K/uL   nRBC 0.0 0.0 - 0.2 %  Urinalysis, Routine w reflex microscopic  Result Value Ref Range   Color, Urine YELLOW YELLOW   APPearance CLEAR CLEAR   Specific Gravity, Urine 1.014 1.005 - 1.030   pH 5.0 5.0 - 8.0   Glucose, UA NEGATIVE NEGATIVE mg/dL   Hgb urine dipstick NEGATIVE NEGATIVE   Bilirubin Urine NEGATIVE NEGATIVE   Ketones, ur NEGATIVE NEGATIVE mg/dL   Protein, ur NEGATIVE NEGATIVE mg/dL   Nitrite NEGATIVE NEGATIVE   Leukocytes, UA NEGATIVE NEGATIVE  Hepatic function panel  Result Value Ref Range   Total Protein 7.2 6.5 - 8.1 g/dL   Albumin 3.9 3.5 - 5.0 g/dL   AST 16 15 - 41 U/L   ALT 15 0 - 44 U/L   Alkaline Phosphatase 72 38 - 126 U/L   Total Bilirubin 0.7 0.3 - 1.2 mg/dL   Bilirubin, Direct 0.1 0.0 - 0.2 mg/dL   Indirect Bilirubin 0.6 0.3 - 0.9 mg/dL  CBG monitoring, ED  Result Value Ref Range   Glucose-Capillary 229 (H) 70 - 99 mg/dL    Dg Chest 2 View Result Date: 09/18/2018 CLINICAL DATA:  Right shoulder pain for several months. Limited range of motion. EXAM: CHEST - 2 VIEW COMPARISON:  04/17/2008 FINDINGS: Cardiac silhouette is normal in  size. No mediastinal or hilar masses. No evidence of adenopathy. Clear lungs.  No pleural effusion or pneumothorax. Skeletal structures are intact. IMPRESSION: No active cardiopulmonary disease. Electronically Signed   By: Lajean Manes M.D.   On: 09/18/2018 13:48   Dg Shoulder Right Result Date: 09/18/2018 CLINICAL DATA:  Right shoulder pain for months. Patient has a repetitive job. Limited range of motion. EXAM: RIGHT SHOULDER - 2+ VIEW COMPARISON:  None. FINDINGS: No fracture or bone lesion. Mild AC joint osteoarthritis. Glenohumeral joint is normally spaced and aligned. Soft tissues are unremarkable. IMPRESSION: 1. No fracture, bone lesion or acute finding. 2. Mild AC joint osteoarthritis. Electronically Signed   By: Lajean Manes M.D.   On: 09/18/2018 13:47    1400:  Itching improved after atarax. CBG elevated per hx, but pt not acidotic with normal AG. Potassium repleted PO. Workup otherwise reassuring. Tx symptomatically, f/u PMD and Ortho MD. Dx and testing d/w pt.  Questions answered.  Verb understanding, agreeable to d/c home with outpt f/u.   Final Clinical Impressions(s) / ED Diagnoses   Final diagnoses:  None    ED Discharge Orders    None       Francine Graven, DO 09/22/18 0720

## 2018-09-18 NOTE — ED Triage Notes (Signed)
Pt hyperglycemia, malaise, and generalized itching x 2-3 weeks. States her CBG's have been in the 220s.

## 2018-09-18 NOTE — Discharge Instructions (Addendum)
Take the prescription as directed.  Take over the counter tylenol, as directed on packaging, as needed for discomfort. Call your regular medical doctor on Monday to schedule a follow up appointment within the next 3 days. Call the Orthopedic doctor on Monday to schedule a follow up appointment within the next week.  Return to the Emergency Department immediately sooner if worsening.

## 2018-09-27 ENCOUNTER — Other Ambulatory Visit: Payer: Self-pay

## 2018-09-27 ENCOUNTER — Encounter: Payer: Self-pay | Admitting: Family Medicine

## 2018-09-27 ENCOUNTER — Ambulatory Visit (INDEPENDENT_AMBULATORY_CARE_PROVIDER_SITE_OTHER): Payer: BLUE CROSS/BLUE SHIELD | Admitting: Family Medicine

## 2018-09-27 VITALS — BP 124/68 | HR 64 | Temp 98.3°F | Resp 14 | Ht 61.0 in | Wt 170.0 lb

## 2018-09-27 DIAGNOSIS — E876 Hypokalemia: Secondary | ICD-10-CM

## 2018-09-27 DIAGNOSIS — E119 Type 2 diabetes mellitus without complications: Secondary | ICD-10-CM

## 2018-09-27 DIAGNOSIS — Z23 Encounter for immunization: Secondary | ICD-10-CM | POA: Diagnosis not present

## 2018-09-27 DIAGNOSIS — I10 Essential (primary) hypertension: Secondary | ICD-10-CM | POA: Diagnosis not present

## 2018-09-27 MED ORDER — AMLODIPINE BESYLATE 10 MG PO TABS: 10.0000 mg | ORAL_TABLET | Freq: Every day | ORAL | 6 refills | Status: DC

## 2018-09-27 MED ORDER — PANTOPRAZOLE SODIUM 40 MG PO TBEC: 40.0000 mg | DELAYED_RELEASE_TABLET | Freq: Every day | ORAL | 3 refills | Status: DC

## 2018-09-27 MED ORDER — POTASSIUM CHLORIDE ER 10 MEQ PO TBCR: 10.0000 meq | EXTENDED_RELEASE_TABLET | Freq: Every day | ORAL | 6 refills | Status: DC

## 2018-09-27 MED ORDER — LISINOPRIL-HYDROCHLOROTHIAZIDE 10-12.5 MG PO TABS: 1.0000 | ORAL_TABLET | Freq: Every day | ORAL | 6 refills | Status: DC

## 2018-09-27 NOTE — Progress Notes (Signed)
   Subjective:    Patient ID: Danielle Hicks, female    DOB: 1955-12-06, 62 y.o.   MRN: [id number]  Patient presents for Follow-up (FSBS elevated)   DM- her CBG has been high, last A1C 6.6% in March    14 Day average 141, after eating yesterday was 161, highest 222, lowest 100  She had episodes of itching which resulted in her going to the ER.  She was told her blood sugar was high that time it was around 200.  She was given hydroxyzine which has helped her itching.  States that she had a beneath the breast in the groin region she did use some topical cortisone and that also help resolve the itching.  She has been taking metformin 500 mg twice a day. Denies any polydipsia polyuria no UTI symptoms.   HTN- taking BP medicine as prescribed   Viewed her ER report.  She also had recurrent hypokalemia she is not taking over-the-counter potassium supplement request a prescription  Potassium 3.0 Review Of Systems:  GEN- denies fatigue, fever, weight loss,weakness, recent illness HEENT- denies eye drainage, change in vision, nasal discharge, CVS- denies chest pain, palpitations RESP- denies SOB, cough, wheeze ABD- denies N/V, change in stools, abd pain GU- denies dysuria, hematuria, dribbling, incontinence MSK- denies joint pain, muscle aches, injury Neuro- denies headache, dizziness, syncope, seizure activity       Objective:    BP 124/68   Pulse 64   Temp 98.3 F (36.8 C) (Oral)   Resp 14   Ht 5\' 1"  (1.549 m)   Wt 170 lb (77.1 kg)   SpO2 100%   BMI 32.12 kg/m  GEN- NAD, alert and oriented x3 HEENT- PERRL, EOMI, non injected sclera, pink conjunctiva, MMM, oropharynx clear Neck- Supple, no thyromegaly CVS- RRR, no murmur RESP-CTAB ABD-NABS,soft,NT,ND EXT- No edema Pulses- Radial, DP- 2+        Assessment & Plan:      Problem List Items Addressed This Visit      High   Controlled type 2 diabetes mellitus without complication, without long-term current use of  insulin ([place])    Her diabetes has been controlled.  We will recheck her A1c goal is less than 7%.  She is can schedule an eye exam.  Foot exam completed today.  Her renal function is at baseline.      Relevant Medications   amLODipine (NORVASC) 10 MG tablet   lisinopril-hydrochlorothiazide (PRINZIDE,ZESTORETIC) 10-12.5 MG tablet   Other Relevant Orders   Hemoglobin A1c (Completed)   Microalbumin / creatinine urine ratio   HM DIABETES FOOT EXAM (Completed)   Essential hypertension, benign - Primary    Blood pressure is well controlled.  No her itching has resolved.      Relevant Medications   amLODipine (NORVASC) 10 MG tablet   lisinopril-hydrochlorothiazide (PRINZIDE,ZESTORETIC) 10-12.5 MG tablet    Other Visit Diagnoses    Hypokalemia       Potassium 10 mEq once a day   Need for immunization against influenza       Relevant Orders   Flu Vaccine QUAD 36+ mos IM (Completed)      Note: This dictation was prepared with Dragon dictation along with smaller phrase technology. Any transcriptional errors that result from this process are unintentional.

## 2018-09-27 NOTE — Patient Instructions (Addendum)
Take the potassium once a day  We will call with lab results  Flu shot given  Schedule eye exam Schedule a physical

## 2018-09-28 ENCOUNTER — Encounter: Payer: Self-pay | Admitting: Family Medicine

## 2018-09-28 LAB — HEMOGLOBIN A1C
Hgb A1c MFr Bld: 6.9 % of total Hgb — ABNORMAL HIGH (ref <5.7)
Mean Plasma Glucose: 151 (calc)
eAG (mmol/L): 8.4 (calc)

## 2018-09-28 LAB — MICROALBUMIN / CREATININE URINE RATIO
Creatinine, Urine: 180 mg/dL (ref 20–275)
Microalb Creat Ratio: 7 mcg/mg creat (ref <30)
Microalb, Ur: 1.2 mg/dL

## 2018-09-28 MED ORDER — BLOOD GLUCOSE TEST VI STRP: ORAL_STRIP | 1 refills | Status: DC

## 2018-09-28 MED ORDER — BLOOD GLUCOSE SYSTEM PAK KIT: PACK | 1 refills | Status: AC

## 2018-09-28 MED ORDER — LANCET DEVICES MISC: 1 refills | Status: DC

## 2018-09-28 NOTE — Assessment & Plan Note (Signed)
Blood pressure is well controlled.  No her itching has resolved.

## 2018-09-28 NOTE — Addendum Note (Signed)
Addended by: Sheral Flow on: 09/28/2018 12:19 PM   Modules accepted: Orders

## 2018-09-28 NOTE — Assessment & Plan Note (Signed)
Her diabetes has been controlled.  We will recheck her A1c goal is less than 7%.  She is can schedule an eye exam.  Foot exam completed today.  Her renal function is at baseline.

## 2018-09-29 ENCOUNTER — Other Ambulatory Visit: Payer: Self-pay | Admitting: *Deleted

## 2018-09-29 ENCOUNTER — Telehealth: Payer: Self-pay | Admitting: *Deleted

## 2018-09-29 MED ORDER — METFORMIN HCL 500 MG PO TABS: 750.0000 mg | ORAL_TABLET | Freq: Two times a day (BID) | ORAL | 3 refills | Status: DC

## 2018-09-29 NOTE — Telephone Encounter (Signed)
Received call from patient.   Reports that she stopped taking Pantoprazole for a while thinking it may have been causing her to have increased itching.   Reports that she resumed treatment last night and noted intense itching returned.  States that she would like to try something different for her reflux.   MD please advise.

## 2018-09-30 MED ORDER — OMEPRAZOLE 40 MG PO CPDR: 40.0000 mg | DELAYED_RELEASE_CAPSULE | Freq: Every day | ORAL | 3 refills | Status: DC

## 2018-09-30 NOTE — Telephone Encounter (Signed)
Call placed to patient. LMTRC.  

## 2018-09-30 NOTE — Telephone Encounter (Signed)
Omeprazole  40mg  1 po daily

## 2018-10-05 NOTE — Telephone Encounter (Signed)
Multiple calls placed to patient with no answer and no return call.   Message to be closed.  

## 2018-10-15 ENCOUNTER — Other Ambulatory Visit (HOSPITAL_COMMUNITY): Payer: Self-pay | Admitting: Family Medicine

## 2018-10-15 DIAGNOSIS — E119 Type 2 diabetes mellitus without complications: Secondary | ICD-10-CM

## 2018-11-17 ENCOUNTER — Other Ambulatory Visit (HOSPITAL_COMMUNITY): Payer: Self-pay | Admitting: Family Medicine

## 2018-11-17 DIAGNOSIS — E119 Type 2 diabetes mellitus without complications: Secondary | ICD-10-CM

## 2018-12-03 LAB — HM MAMMOGRAPHY

## 2018-12-07 ENCOUNTER — Encounter: Payer: Self-pay | Admitting: *Deleted

## 2018-12-22 ENCOUNTER — Encounter: Payer: Self-pay | Admitting: Orthopedic Surgery

## 2018-12-22 ENCOUNTER — Ambulatory Visit (INDEPENDENT_AMBULATORY_CARE_PROVIDER_SITE_OTHER): Payer: BLUE CROSS/BLUE SHIELD | Admitting: Orthopedic Surgery

## 2018-12-22 VITALS — BP 123/88 | HR 76 | Ht 61.0 in | Wt 160.0 lb

## 2018-12-22 DIAGNOSIS — M25511 Pain in right shoulder: Secondary | ICD-10-CM | POA: Diagnosis not present

## 2018-12-22 DIAGNOSIS — M7541 Impingement syndrome of right shoulder: Secondary | ICD-10-CM | POA: Diagnosis not present

## 2018-12-22 DIAGNOSIS — G8929 Other chronic pain: Secondary | ICD-10-CM | POA: Diagnosis not present

## 2018-12-22 MED ORDER — ACETAMINOPHEN-CODEINE #3 300-30 MG PO TABS: 1.0000 | ORAL_TABLET | Freq: Four times a day (QID) | ORAL | 0 refills | Status: DC | PRN

## 2018-12-22 MED ORDER — PREDNISONE 10 MG PO TABS: 10.0000 mg | ORAL_TABLET | Freq: Two times a day (BID) | ORAL | 0 refills | Status: DC

## 2018-12-22 MED ORDER — ACETAMINOPHEN-CODEINE #3 300-30 MG PO TABS: 1.0000 | ORAL_TABLET | Freq: Four times a day (QID) | ORAL | 0 refills | Status: AC | PRN

## 2018-12-22 NOTE — Addendum Note (Signed)
Addended by: Carole Civil on: 12/22/2018 03:05 PM   Modules accepted: Orders

## 2018-12-22 NOTE — Patient Instructions (Addendum)
Shoulder Range of Motion Exercises Shoulder range of motion (ROM) exercises are done to keep the shoulder moving freely or to increase movement. They are often recommended for people who have shoulder pain or stiffness or who are recovering from a shoulder surgery. Phase 1 exercises When you are able, do this exercise 1-2 times per day for 30-60 seconds in each direction, or as directed by your health care provider. Pendulum exercise To do this exercise while sitting: 1. Sit in a chair or at the edge of your bed with your feet flat on the floor. 2. Let your affected arm hang down in front of you over the edge of the bed or chair. 3. Relax your shoulder, arm, and hand. 4. Rock your body so your arm gently swings in small circles. You can also use your unaffected arm to start the motion. 5. Repeat changing the direction of the circles, swinging your arm left and right, and swinging your arm forward and back. To do this exercise while standing: 1. Stand next to a sturdy chair or table, and hold on to it with your hand on your unaffected side. 2. Bend forward at the waist. 3. Bend your knees slightly. 4. Relax your shoulder, arm, and hand. 5. While keeping your shoulder relaxed, use body motion to swing your arm in small circles. 6. Repeat changing the direction of the circles, swinging your arm left and right, and swinging your arm forward and back. 7. Between exercises, stand up tall and take a short break to relax your lower back.  Phase 2 exercises Do these exercises 1-2 times per day or as told by your health care provider. Hold each stretch for 30 seconds, and repeat 3 times. Do the exercises with one or both arms as instructed by your health care provider. For these exercises, sit at a table with your hand and arm supported by the table. A chair that slides easily or has wheels can be helpful. External rotation 1. Turn your chair so that your affected side is nearest to the  table. 2. Place your forearm on the table to your side. Bend your elbow about 90 at the elbow (right angle) and place your hand palm facing down on the table. Your elbow should be about 6 inches away from your side. 3. Keeping your arm on the table, lean your body forward. Abduction 1. Turn your chair so that your affected side is nearest to the table. 2. Place your forearm and hand on the table so that your thumb points toward the ceiling and your arm is straight out to your side. 3. Slide your hand out to the side and away from you, using your unaffected arm to do the work. 4. To increase the stretch, you can slide your chair away from the table. Flexion: forward stretch 1. Sit facing the table. Place your hand and elbow on the table in front of you. 2. Slide your hand forward and away from you, using your unaffected arm to do the work. 3. To increase the stretch, you can slide your chair backward. Phase 3 exercises Do these exercises 1-2 times per day or as told by your health care provider. Hold each stretch for 30 seconds, and repeat 3 times. Do the exercises with one or both arms as instructed by your health care provider. Cross-body stretch: posterior capsule stretch 1. Lift your arm straight out in front of you. 2. Bend your arm 90 at the elbow (right angle) so your forearm   moves across your body. 3. Use your other arm to gently pull the elbow across your body, toward your other shoulder. Wall climbs 1. Stand with your affected arm extended out to the side with your hand resting on a door frame. 2. Slide your hand slowly up the door frame. 3. To increase the stretch, step through the door frame. Keep your body upright and do not lean. Wand exercises You will need a cane, a piece of PVC pipe, or a sturdy wooden dowel for wand exercises. Flexion To do this exercise while standing: 1. Hold the wand with both of your hands, palms down. 2. Using the other arm to help, lift your arms  up and over your head, if able. 3. Push upward with your other arm to gently increase the stretch. To do this exercise while lying down: 1. Lie on your back with your elbows resting on the floor and the wand in both your hands. Your hands will be palm down, or pointing toward your feet. 2. Lift your hands toward the ceiling, using your unaffected arm to help if needed. 3. Bring your arms overhead as able, using your unaffected arm to help if needed. Internal rotation 1. Stand while holding the wand behind you with both hands. Your unaffected arm should be extended above your head with the arm of the affected side extended behind you at the level of your waist. The wand should be pointing straight up and down as you hold it. 2. Slowly pull the wand up behind your back by straightening the elbow of your unaffected arm and bending the elbow of your affected arm. External rotation 1. Lie on your back with your affected upper arm supported on a small pillow or rolled towel. When you first do this exercise, keep your upper arm close to your body. Over time, bring your arm up to a 90 angle out to the side. 2. Hold the wand across your stomach and with both hands palm up. Your elbow on your affected side should be bent at a 90 angle. 3. Use your unaffected side to help push your forearm away from you and toward the floor. Keep your elbow on your affected side bent at a 90 angle. Contact a health care provider if you have:  New or increasing pain.  New numbness, tingling, weakness, or discoloration in your arm or hand. This information is not intended to replace advice given to you by your health care provider. Make sure you discuss any questions you have with your health care provider. Document Released: 07/26/2003 Document Revised: 12/09/2017 Document Reviewed: 12/09/2017 Elsevier Interactive Patient Education  2019 [place] have received an injection of steroids into the joint. 15% of  patients will have increased pain within the 24 hours postinjection.   This is transient and will go away.   We recommend that you use ice packs on the injection site for 20 minutes every 2 hours and extra strength Tylenol 2 tablets every 8 as needed until the pain resolves.  If you continue to have pain after taking the Tylenol and using the ice please call the office for further instructions.

## 2018-12-22 NOTE — Progress Notes (Signed)
Patient ID: Danielle Hicks, female   DOB: 1955-12-04, 63 y.o.   MRN: [id number]  Chief Complaint  Patient presents with  . Shoulder Pain    right     HPI Danielle Hicks is a 63 y.o. female.   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  She had x-ray she was told she had arthritis of the shoulder.  The pain is located over the right deltoid it is a dull ache it is worse at night it is associated with the overhead activity and the rowing activity.  No prior treatment    Review of Systems Review of Systems  Musculoskeletal: Positive for neck pain.  Neurological: Negative for tingling, sensory change and focal weakness.    Past Medical History:  Diagnosis Date  . Arthritis   . GERD (gastroesophageal reflux disease)   . Headache   . Hypertension   . Type 2 diabetes mellitus ([place])     Past Surgical History:  Procedure Laterality Date  . ABDOMINAL HYSTERECTOMY    . BLEPHAROPLASTY Bilateral   . COLONOSCOPY N/A 06/17/2018   Procedure: COLONOSCOPY;  Surgeon: Rogene Houston, MD;  Location: [facility];  Service: Endoscopy;  Laterality: N/A;  12:25  . HEMORRHOID SURGERY    . POLYPECTOMY  06/17/2018   Procedure: POLYPECTOMY;  Surgeon: Rogene Houston, MD;  Location: [facility];  Service: Endoscopy;;  colon  . TOTAL KNEE ARTHROPLASTY Left 01/26/2018   Procedure: TOTAL KNEE ARTHROPLASTY;  Surgeon: Carole Civil, MD;  Location: [facility];  Service: Orthopedics;  Laterality: Left;    Family History  Problem Relation Age of Onset  . Diabetes Mother   . Hypertension Mother   . Stroke Sister      Social History   Tobacco Use  . Smoking status: Current Some Day Smoker    Types: Cigarettes  . Smokeless tobacco: Never Used  . Tobacco comment: trying to quit   Substance Use Topics  . Alcohol use: No    Alcohol/week: 0.0 standard drinks  . Drug use: No    Allergies  Allergen Reactions  .  Penicillins Rash     Has patient had a PCN reaction causing immediate rash, facial/tongue/throat swelling, SOB or lightheadedness with hypotension: Yes Has patient had a PCN reaction causing severe rash involving mucus membranes or skin necrosis: No Has patient had a PCN reaction that required hospitalization: No Has patient had a PCN reaction occurring within the last 10 years: No If all of the above answers are "NO", then may proceed with Cephalosporin use.     Allergies  Allergen Reactions  . Penicillins Rash     Has patient had a PCN reaction causing immediate rash, facial/tongue/throat swelling, SOB or lightheadedness with hypotension: Yes Has patient had a PCN reaction causing severe rash involving mucus membranes or skin necrosis: No Has patient had a PCN reaction that required hospitalization: No Has patient had a PCN reaction occurring within the last 10 years: No If all of the above answers are "NO", then may proceed with Cephalosporin use.      Current Meds  Medication Sig  . amLODipine (NORVASC) 10 MG tablet TAKE 1 TABLET BY MOUTH ONCE DAILY  . Blood Glucose Monitoring Suppl (BLOOD GLUCOSE SYSTEM PAK) KIT Please dispense based on patient and insurance preference. Use as directed to monitor FSBS 1x daily. Dx: E11.9  . Glucose Blood (BLOOD  GLUCOSE TEST STRIPS) STRP Please dispense based on patient and insurance preference. Use as directed to monitor FSBS 1x daily. Dx: E11.9  . hydrOXYzine (ATARAX/VISTARIL) 25 MG tablet Take 1 tablet (25 mg total) by mouth every 8 (eight) hours as needed for itching.  Elmore Gu[employer] Devices MISC Please dispense based on patient and insurance preference. Use as directed to monitor FSBS 1x daily. Dx: E11.9  . lisinopril-hydrochlorothiazide (PRINZIDE,ZESTORETIC) 10-12.5 MG tablet TAKE 1 TABLET BY MOUTH ONCE DAILY  . metFORMIN (GLUCOPHAGE) 500 MG tablet Take 1.5 tablets (750 mg total) by mouth 2 (two) times daily with a meal.  . Multiple Vitamin  (MULTIVITAMIN WITH MINERALS) TABS tablet Take 1 tablet by mouth 4 (four) times a week.  Marland Kitchen omeprazole (PRILOSEC) 40 MG capsule Take 1 capsule (40 mg total) by mouth daily.  . potassium chloride (K-DUR) 10 MEQ tablet Take 1 tablet (10 mEq total) by mouth daily.  Marland Kitchen trolamine salicylate (ASPERCREME) 10 % cream Apply 1 application topically as needed (knee pain).        Physical Exam BP 123/88   Pulse 76   Ht 5' 1"  (1.549 m)   Wt 160 lb (72.6 kg)   BMI 30.23 kg/m  Physical Exam Vitals signs reviewed.  Constitutional:      Appearance: Normal appearance. She is well-developed.  Neurological:     Mental Status: She is alert and oriented to person, place, and time.  Psychiatric:        Attention and Perception: Attention normal.        Mood and Affect: Mood and affect normal.        Speech: Speech normal.        Behavior: Behavior normal.        Thought Content: Thought content normal.        Judgment: Judgment normal.    Ambulatory status normal with no assistive devices Right Shoulder Exam   Tenderness  Right shoulder tenderness location: Deltoid area is tender acromion is nontender neck is tender.  Range of Motion  The patient has normal right shoulder ROM.  Muscle Strength  The patient has normal right shoulder strength.  Tests  Apprehension: negative Cross arm: negative Impingement: positive Drop arm: negative Sulcus: absent  Other  Erythema: absent Sensation: normal Pulse: present   Left Shoulder Exam  Left shoulder exam is normal.  Tenderness  The patient is experiencing no tenderness.   Range of Motion  The patient has normal left shoulder ROM.  Muscle Strength  The patient has normal left shoulder strength.  Tests  Apprehension: negative  Other  Erythema: absent Sensation: normal Pulse: present       MEDICAL DECISION SECTION  My independent reading of xrays: aph, 3 view right shoulder glenohumeral joint looks relatively normal humeral  head is internally rotated making appropriate assessment of [place] line not possible acromion is type I probably and the axillary view was normal, AC joint arthritis mild  CLINICAL DATA:  Right shoulder pain for months. Patient has a repetitive job. Limited range of motion.   EXAM: RIGHT SHOULDER - 2+ VIEW   COMPARISON:  None.   FINDINGS: No fracture or bone lesion.   Mild AC joint osteoarthritis. Glenohumeral joint is normally spaced and aligned.   Soft tissues are unremarkable.   IMPRESSION: 1. No fracture, bone lesion or acute finding. 2. Mild AC joint osteoarthritis.     Electronically Signed   By: Lajean Manes M.D.   On: 09/18/2018 13:47  Encounter Diagnoses  Name Primary?  . Chronic right shoulder pain   . Rotator cuff impingement syndrome of right shoulder Yes     PLAN:   Meds ordered this encounter  Medications  . predniSONE (DELTASONE) 10 MG tablet    Sig: Take 1 tablet (10 mg total) by mouth 2 (two) times daily with a meal.    Dispense:  60 tablet    Refill:  0  . acetaminophen-codeine (TYLENOL #3) 300-30 MG tablet    Sig: Take 1 tablet by mouth every 6 (six) hours as needed for up to 5 days for moderate pain.    Dispense:  20 tablet    Refill:  0   Procedure note the subacromial injection shoulder RIGHT  Verbal consent was obtained to inject the  RIGHT   Shoulder  Timeout was completed to confirm the injection site is a subacromial space of the  RIGHT  shoulder   Medication used Depo-Medrol 40 mg and lidocaine 1% 3 cc  Anesthesia was provided by ethyl chloride  The injection was performed in the RIGHT  posterior subacromial space. After pinning the skin with alcohol and anesthetized the skin with ethyl chloride the subacromial space was injected using a 20-gauge needle. There were no complications  Sterile dressing was applied.   Home exercises

## 2019-01-06 ENCOUNTER — Other Ambulatory Visit (HOSPITAL_COMMUNITY): Payer: Self-pay | Admitting: Family Medicine

## 2019-01-06 DIAGNOSIS — E119 Type 2 diabetes mellitus without complications: Secondary | ICD-10-CM

## 2019-01-14 ENCOUNTER — Other Ambulatory Visit: Payer: Self-pay

## 2019-01-14 ENCOUNTER — Encounter: Payer: Self-pay | Admitting: Family Medicine

## 2019-01-14 ENCOUNTER — Ambulatory Visit (INDEPENDENT_AMBULATORY_CARE_PROVIDER_SITE_OTHER): Payer: BLUE CROSS/BLUE SHIELD | Admitting: Family Medicine

## 2019-01-14 VITALS — BP 122/68 | HR 82 | Temp 98.5°F | Resp 14 | Ht 61.0 in | Wt 153.0 lb

## 2019-01-14 DIAGNOSIS — F331 Major depressive disorder, recurrent, moderate: Secondary | ICD-10-CM | POA: Diagnosis not present

## 2019-01-14 DIAGNOSIS — Z114 Encounter for screening for human immunodeficiency virus [HIV]: Secondary | ICD-10-CM

## 2019-01-14 DIAGNOSIS — J069 Acute upper respiratory infection, unspecified: Secondary | ICD-10-CM

## 2019-01-14 DIAGNOSIS — F411 Generalized anxiety disorder: Secondary | ICD-10-CM

## 2019-01-14 DIAGNOSIS — I10 Essential (primary) hypertension: Secondary | ICD-10-CM | POA: Diagnosis not present

## 2019-01-14 DIAGNOSIS — Z1159 Encounter for screening for other viral diseases: Secondary | ICD-10-CM

## 2019-01-14 DIAGNOSIS — E119 Type 2 diabetes mellitus without complications: Secondary | ICD-10-CM | POA: Diagnosis not present

## 2019-01-14 DIAGNOSIS — Z0000 Encounter for general adult medical examination without abnormal findings: Secondary | ICD-10-CM

## 2019-01-14 DIAGNOSIS — R634 Abnormal weight loss: Secondary | ICD-10-CM

## 2019-01-14 DIAGNOSIS — J0100 Acute maxillary sinusitis, unspecified: Secondary | ICD-10-CM

## 2019-01-14 MED ORDER — HYDROXYZINE HCL 25 MG PO TABS: 25.0000 mg | ORAL_TABLET | Freq: Three times a day (TID) | ORAL | 0 refills | Status: DC | PRN

## 2019-01-14 MED ORDER — METFORMIN HCL 500 MG PO TABS: 500.0000 mg | ORAL_TABLET | Freq: Two times a day (BID) | ORAL | 3 refills | Status: DC

## 2019-01-14 MED ORDER — AMLODIPINE BESYLATE 10 MG PO TABS: 10.0000 mg | ORAL_TABLET | Freq: Every day | ORAL | 2 refills | Status: DC

## 2019-01-14 MED ORDER — ESCITALOPRAM OXALATE 5 MG PO TABS: 5.0000 mg | ORAL_TABLET | Freq: Every day | ORAL | 2 refills | Status: DC

## 2019-01-14 MED ORDER — CEFDINIR 300 MG PO CAPS: 300.0000 mg | ORAL_CAPSULE | Freq: Two times a day (BID) | ORAL | 0 refills | Status: DC

## 2019-01-14 MED ORDER — LISINOPRIL-HYDROCHLOROTHIAZIDE 10-12.5 MG PO TABS: 1.0000 | ORAL_TABLET | Freq: Every day | ORAL | 2 refills | Status: DC

## 2019-01-14 NOTE — Addendum Note (Signed)
Addended by: Vic Blackbird F on: 01/14/2019 04:15 PM   Modules accepted: Orders

## 2019-01-14 NOTE — Assessment & Plan Note (Addendum)
Well controlled, with weight loss likely will need very little of the metformin Goal A1C < 7% She will schedule eye exam

## 2019-01-14 NOTE — Assessment & Plan Note (Signed)
Well controlled no changes 

## 2019-01-14 NOTE — Assessment & Plan Note (Signed)
Restart lexapro 5mg , appetite is picking up Hydroxyzine for nervous itching Recheck in 4 weeks  Expect weight to improve, but also checking labs, TSH

## 2019-01-14 NOTE — Progress Notes (Signed)
Subjective:    Patient ID: Danielle Hicks, female    DOB: 07-Apr-1956, 63 y.o.   MRN: [id number]  Patient presents for Annual Exam (is not fasting); Illness (x1 week- nasal drainage, chest congestion, nonproductive cough); and Anxiety/ Depression  MDD/Anxiety- itches all over, used some hydroxyzine which helped, her nerves have been bad, has been depressed, she has been having problems with how her step siblings and her father has been treating her. Father keeps saying she is not his daughter, but then keeps asking for money and help. When she had her surgery none of her siblings came to visit her, but always asking for gifts.  She states that she feels like she did when she had her surgery and was very anxious and self-conscious.  She would like to go back on the Lexapro as this helps.  Note that she has not been eating well she has not had an appetite especially when she gets very stressed out she was 170 pounds back in November.  She does admit that she is now back on speaking terms with her father She is retiring in June   Orthopedics- had steroid shot by Dr. Aline Brochure for rotator cuff impingment last month  Cough with congestion on and off for a couple of weeks. Used mucinex and now diabetic tussin  DM, No fever, but has had some night sweats but then states uses a lot of covers, had diarrhea initially when she was eating as well, but that has now resolved.      HTN- taking norvasc and lisinopril HCTZ   DM- Last A1C 6.9%, meformin 500mg  BID after she noticed the weigh tloss and blood sugar going down     S/p hysterctomy  Colonoscopy- UTD   Mammogram Jan 2020   Due for eye visit  Immunizations- Due for shingle vaccine      States had rash around nose but not sure if it was from something else, instead of the PCN     Review Of Systems:  GEN- denies fatigue, fever, weight loss,weakness, recent illness HEENT- denies eye drainage, change in vision,+ nasal discharge, CVS- denies  chest pain, palpitations RESP- denies SOB, +cough, wheeze ABD- denies N/V, change in stools, abd pain GU- denies dysuria, hematuria, dribbling, incontinence MSK- denies joint pain, muscle aches, injury Neuro- denies headache, dizziness, syncope, seizure activity       Objective:    BP 122/68   Pulse 82   Temp 98.5 F (36.9 C) (Oral)   Resp 14   Ht 5\' 1"  (1.549 m)   Wt 153 lb (69.4 kg)   SpO2 98%   BMI 28.91 kg/m  GEN- NAD, alert and oriented x3 HEENT- PERRL, EOMI, non injected sclera, pink conjunctiva, MMM, oropharynx clear, nares enlarged turbinates, TTP maxillary sinus, TM clear no effusion Neck- Supple, no thyromegaly, shotty LAD CVS- RRR, no murmur RESP- nild congestion bilat, no wheeze, normal WOB ABD-NABS,soft,NT,ND Psych- normal affect and mood, no SI, well groomed  EXT- No edema Pulses- Radial, DP- 2+        Assessment & Plan:      Problem List Items Addressed This Visit      High   Controlled type 2 diabetes mellitus without complication, without long-term current use of insulin (HCC)    Well controlled, with weight loss likely will need very little of the metformin Goal A1C < 7% She will schedule eye exam      Relevant Medications   amLODipine (NORVASC) 10 MG tablet  lisinopril-hydrochlorothiazide (PRINZIDE,ZESTORETIC) 10-12.5 MG tablet   Other Relevant Orders   Hemoglobin A1c   Lipid panel   Essential hypertension, benign    Well controlled no changes       Relevant Medications   amLODipine (NORVASC) 10 MG tablet   lisinopril-hydrochlorothiazide (PRINZIDE,ZESTORETIC) 10-12.5 MG tablet   Other Relevant Orders   Lipid panel   TSH     Medium   Major depressive disorder, recurrent episode (HCC)    Restart lexapro 5mg , appetite is picking up Hydroxyzine for nervous itching Recheck in 4 weeks  Expect weight to improve, but also checking labs, TSH       Relevant Medications   escitalopram (LEXAPRO) 5 MG tablet   hydrOXYzine  (ATARAX/VISTARIL) 25 MG tablet     Unprioritized   GAD (generalized anxiety disorder)   Relevant Medications   escitalopram (LEXAPRO) 5 MG tablet   hydrOXYzine (ATARAX/VISTARIL) 25 MG tablet    Other Visit Diagnoses    Routine general medical examination at a health care facility    -  Primary   CPE done, Discussed shingrix pt to review info, will get at pharmacy if she wants   Relevant Orders   CBC with Differential/Platelet   Comprehensive metabolic panel   Weight loss       Relevant Orders   TSH   Need for hepatitis C screening test       Relevant Orders   Hepatitis C antibody   Encounter for screening for HIV       Relevant Orders   HIV Antibody (routine testing w rflx)   Acute maxillary sinusitis, recurrence not specified       omnicef, not sure if true reaction to Penicillin inpast, nasal saline/nasal steroid, robitussin DM for cough   Relevant Medications   cefdinir (OMNICEF) 300 MG capsule   Acute URI       Relevant Medications   cefdinir (OMNICEF) 300 MG capsule      Note: This dictation was prepared with Dragon dictation along with smaller phrase technology. Any transcriptional errors that result from this process are unintentional.

## 2019-01-14 NOTE — Patient Instructions (Addendum)
Continue metformin at 500mg  twice a day  Continue blood pressure meds Take antibiotics Use flonse or nasal saline Start the lexapro  Set your eye appointment  F/U 4 weeks

## 2019-01-17 ENCOUNTER — Encounter: Payer: Self-pay | Admitting: Family Medicine

## 2019-01-17 DIAGNOSIS — D649 Anemia, unspecified: Secondary | ICD-10-CM | POA: Insufficient documentation

## 2019-01-17 LAB — TSH: TSH: 1.06 mIU/L (ref 0.40–4.50)

## 2019-01-17 LAB — CBC WITH DIFFERENTIAL/PLATELET
Absolute Monocytes: 470 cells/uL (ref 200–950)
Basophils Absolute: 31 cells/uL (ref 0–200)
Basophils Relative: 0.4 %
Eosinophils Absolute: 139 cells/uL (ref 15–500)
Eosinophils Relative: 1.8 %
HCT: 34.4 % — ABNORMAL LOW (ref 35.0–45.0)
Hemoglobin: 11.5 g/dL — ABNORMAL LOW (ref 11.7–15.5)
Lymphs Abs: 3742 cells/uL (ref 850–3900)
MCH: 26.8 pg — ABNORMAL LOW (ref 27.0–33.0)
MCHC: 33.4 g/dL (ref 32.0–36.0)
MCV: 80.2 fL (ref 80.0–100.0)
MPV: 10 fL (ref 7.5–12.5)
Monocytes Relative: 6.1 %
Neutro Abs: 3319 cells/uL (ref 1500–7800)
Neutrophils Relative %: 43.1 %
Platelets: 557 10*3/uL — ABNORMAL HIGH (ref 140–400)
RBC: 4.29 10*6/uL (ref 3.80–5.10)
RDW: 16.8 % — ABNORMAL HIGH (ref 11.0–15.0)
Total Lymphocyte: 48.6 %
WBC: 7.7 10*3/uL (ref 3.8–10.8)

## 2019-01-17 LAB — LIPID PANEL
Cholesterol: 177 mg/dL (ref <200)
HDL: 55 mg/dL (ref >=50)
LDL Cholesterol (Calc): 99 mg/dL (calc)
Non-HDL Cholesterol (Calc): 122 mg/dL (calc) (ref <130)
Total CHOL/HDL Ratio: 3.2 (calc) (ref <5.0)
Triglycerides: 124 mg/dL (ref <150)

## 2019-01-17 LAB — COMPREHENSIVE METABOLIC PANEL
AG Ratio: 1.7 (calc) (ref 1.0–2.5)
ALT: 11 U/L (ref 6–29)
AST: 13 U/L (ref 10–35)
Albumin: 4 g/dL (ref 3.6–5.1)
Alkaline phosphatase (APISO): 62 U/L (ref 37–153)
BUN: 10 mg/dL (ref 7–25)
CO2: 24 mmol/L (ref 20–32)
Calcium: 9.6 mg/dL (ref 8.6–10.4)
Chloride: 108 mmol/L (ref 98–110)
Creat: 0.83 mg/dL (ref 0.50–0.99)
Globulin: 2.3 g/dL (calc) (ref 1.9–3.7)
Glucose, Bld: 91 mg/dL (ref 65–99)
Potassium: 4.8 mmol/L (ref 3.5–5.3)
Sodium: 144 mmol/L (ref 135–146)
Total Bilirubin: 0.4 mg/dL (ref 0.2–1.2)
Total Protein: 6.3 g/dL (ref 6.1–8.1)

## 2019-01-17 LAB — HEMOGLOBIN A1C
Hgb A1c MFr Bld: 6.7 % of total Hgb — ABNORMAL HIGH (ref <5.7)
Mean Plasma Glucose: 146 (calc)
eAG (mmol/L): 8.1 (calc)

## 2019-01-17 LAB — HIV ANTIBODY (ROUTINE TESTING W REFLEX): HIV 1&2 Ab, 4th Generation: NONREACTIVE

## 2019-01-17 LAB — HEPATITIS C ANTIBODY
Hepatitis C Ab: NONREACTIVE
SIGNAL TO CUT-OFF: 0.01 (ref <1.00)

## 2019-01-20 ENCOUNTER — Other Ambulatory Visit: Payer: Self-pay | Admitting: *Deleted

## 2019-01-20 DIAGNOSIS — D696 Thrombocytopenia, unspecified: Secondary | ICD-10-CM

## 2019-01-20 DIAGNOSIS — D75839 Thrombocytosis, unspecified: Secondary | ICD-10-CM

## 2019-01-20 DIAGNOSIS — D473 Essential (hemorrhagic) thrombocythemia: Secondary | ICD-10-CM

## 2019-01-20 DIAGNOSIS — D649 Anemia, unspecified: Secondary | ICD-10-CM

## 2019-01-20 MED ORDER — METFORMIN HCL 500 MG PO TABS: 500.0000 mg | ORAL_TABLET | Freq: Every day | ORAL | 3 refills | Status: DC

## 2019-01-21 ENCOUNTER — Other Ambulatory Visit: Payer: BLUE CROSS/BLUE SHIELD

## 2019-01-21 ENCOUNTER — Other Ambulatory Visit: Payer: Self-pay

## 2019-01-21 DIAGNOSIS — D75839 Thrombocytosis, unspecified: Secondary | ICD-10-CM

## 2019-01-21 DIAGNOSIS — D649 Anemia, unspecified: Secondary | ICD-10-CM

## 2019-01-21 DIAGNOSIS — D473 Essential (hemorrhagic) thrombocythemia: Secondary | ICD-10-CM

## 2019-01-22 LAB — CBC WITH DIFFERENTIAL/PLATELET
Absolute Monocytes: 490 cells/uL (ref 200–950)
Basophils Absolute: 43 cells/uL (ref 0–200)
Basophils Relative: 0.6 %
Eosinophils Absolute: 187 cells/uL (ref 15–500)
Eosinophils Relative: 2.6 %
HCT: 30.5 % — ABNORMAL LOW (ref 35.0–45.0)
Hemoglobin: 9.8 g/dL — ABNORMAL LOW (ref 11.7–15.5)
Lymphs Abs: 3290 cells/uL (ref 850–3900)
MCH: 26.4 pg — ABNORMAL LOW (ref 27.0–33.0)
MCHC: 32.1 g/dL (ref 32.0–36.0)
MCV: 82.2 fL (ref 80.0–100.0)
MPV: 10.7 fL (ref 7.5–12.5)
Monocytes Relative: 6.8 %
Neutro Abs: 3190 cells/uL (ref 1500–7800)
Neutrophils Relative %: 44.3 %
Platelets: 420 10*3/uL — ABNORMAL HIGH (ref 140–400)
RBC: 3.71 10*6/uL — ABNORMAL LOW (ref 3.80–5.10)
RDW: 16.5 % — ABNORMAL HIGH (ref 11.0–15.0)
Total Lymphocyte: 45.7 %
WBC: 7.2 10*3/uL (ref 3.8–10.8)

## 2019-01-22 LAB — IRON,TIBC AND FERRITIN PANEL
%SAT: 26 % (calc) (ref 16–45)
Ferritin: 28 ng/mL (ref 16–232)
Iron: 66 ug/dL (ref 45–160)
TIBC: 254 mcg/dL (calc) (ref 250–450)

## 2019-01-26 ENCOUNTER — Other Ambulatory Visit: Payer: Self-pay | Admitting: Family Medicine

## 2019-01-26 DIAGNOSIS — D649 Anemia, unspecified: Secondary | ICD-10-CM

## 2019-02-02 ENCOUNTER — Ambulatory Visit: Payer: BLUE CROSS/BLUE SHIELD | Admitting: Orthopedic Surgery

## 2019-02-11 ENCOUNTER — Ambulatory Visit: Payer: BLUE CROSS/BLUE SHIELD | Admitting: Family Medicine

## 2019-02-14 ENCOUNTER — Ambulatory Visit (INDEPENDENT_AMBULATORY_CARE_PROVIDER_SITE_OTHER): Payer: BLUE CROSS/BLUE SHIELD | Admitting: Family Medicine

## 2019-02-14 ENCOUNTER — Encounter: Payer: Self-pay | Admitting: Family Medicine

## 2019-02-14 ENCOUNTER — Other Ambulatory Visit: Payer: Self-pay

## 2019-02-14 DIAGNOSIS — F331 Major depressive disorder, recurrent, moderate: Secondary | ICD-10-CM | POA: Diagnosis not present

## 2019-02-14 DIAGNOSIS — L299 Pruritus, unspecified: Secondary | ICD-10-CM

## 2019-02-14 DIAGNOSIS — E119 Type 2 diabetes mellitus without complications: Secondary | ICD-10-CM

## 2019-02-14 DIAGNOSIS — D649 Anemia, unspecified: Secondary | ICD-10-CM

## 2019-02-14 NOTE — Progress Notes (Signed)
Virtual Visit via Telephone Note  I connected with Danielle Hicks on 02/14/19 at 12:43pm  by telephone and verified that I am speaking with the correct person using two identifiers.   Pt location: at home   Physician Location: in office, [place] Medication, Vic Blackbird MD  I discussed the limitations, risks, security and privacy concerns of performing an evaluation and management service by telephone and the availability of in person appointments. I also discussed with the patient that there may be a patient responsible charge related to this service. The patient expressed understanding and agreed to proceed.   History of Present Illness:  Still gets some itching mostly in her head, using hydrozyzine a few times a week, body itching has stopped. She did remove her hair weave to see if that would help, has not felt anything in her scalp  Stress levels, have improved on Lexapro 5mg , sleep improved    Weight last 153lbs March6th , appetite has improved, but had been eating more junk good, weight now 157lbs  DM- last A1C 6.7% in March, I reduced to  Metformin 500mg - once a day but initially she has been taking BID,  CBG this AM 92, yesterday  91, no hypoglycemia   Taking multivitamin with iron , chronic anemia, Iron studies normal, denies blood in stool or urine, denies fatigue          Observations/Objective: Telephone visit   Assessment and Plan:  Chronic anemia-  We will plan for repeat CBC/B12 for anemia end of April  DM-continue metformin 500mg  once a day   MDD- continue lexapro 5mg , doing well on this medication, no SE , symptoms improved, appetite improved  Pruritis- overall improved, now only in scalp, advised to ask family member to visualize scalp, can try anti-itch shampoo such as head and shoulders or Nizoral  Follow Up Instructions:      I discussed the assessment and treatment plan with the patient. The patient was provided an opportunity to ask  questions and all were answered. The patient agreed with the plan and demonstrated an understanding of the instructions.   The patient was advised to call back or seek an in-person evaluation if the symptoms worsen or if the condition fails to improve as anticipated.  I provided  11 minutes of non-face-to-face time during this encounter.   End time 12:54PM  Vic Blackbird, MD

## 2019-02-28 ENCOUNTER — Other Ambulatory Visit: Payer: Self-pay

## 2019-02-28 ENCOUNTER — Other Ambulatory Visit: Payer: BLUE CROSS/BLUE SHIELD

## 2019-02-28 DIAGNOSIS — D649 Anemia, unspecified: Secondary | ICD-10-CM

## 2019-02-28 LAB — CBC WITH DIFFERENTIAL/PLATELET
Absolute Monocytes: 413 cells/uL (ref 200–950)
Basophils Absolute: 62 cells/uL (ref 0–200)
Basophils Relative: 0.8 %
Eosinophils Absolute: 304 cells/uL (ref 15–500)
Eosinophils Relative: 3.9 %
HCT: 34.8 % — ABNORMAL LOW (ref 35.0–45.0)
Hemoglobin: 11.6 g/dL — ABNORMAL LOW (ref 11.7–15.5)
Lymphs Abs: 2636 cells/uL (ref 850–3900)
MCH: 27 pg (ref 27.0–33.0)
MCHC: 33.3 g/dL (ref 32.0–36.0)
MCV: 81.1 fL (ref 80.0–100.0)
MPV: 10.5 fL (ref 7.5–12.5)
Monocytes Relative: 5.3 %
Neutro Abs: 4384 cells/uL (ref 1500–7800)
Neutrophils Relative %: 56.2 %
Platelets: 417 10*3/uL — ABNORMAL HIGH (ref 140–400)
RBC: 4.29 10*6/uL (ref 3.80–5.10)
RDW: 16.5 % — ABNORMAL HIGH (ref 11.0–15.0)
Total Lymphocyte: 33.8 %
WBC: 7.8 10*3/uL (ref 3.8–10.8)

## 2019-03-01 LAB — VITAMIN B12: Vitamin B-12: 411 pg/mL (ref 200–1100)

## 2019-03-02 ENCOUNTER — Ambulatory Visit: Payer: BLUE CROSS/BLUE SHIELD | Admitting: Orthopedic Surgery

## 2019-04-06 ENCOUNTER — Other Ambulatory Visit: Payer: Self-pay

## 2019-04-06 ENCOUNTER — Ambulatory Visit (INDEPENDENT_AMBULATORY_CARE_PROVIDER_SITE_OTHER): Payer: BLUE CROSS/BLUE SHIELD

## 2019-04-06 ENCOUNTER — Ambulatory Visit (INDEPENDENT_AMBULATORY_CARE_PROVIDER_SITE_OTHER): Payer: BLUE CROSS/BLUE SHIELD | Admitting: Orthopedic Surgery

## 2019-04-06 VITALS — BP 118/80 | HR 63 | Temp 97.6°F | Ht 61.0 in | Wt 155.0 lb

## 2019-04-06 DIAGNOSIS — Z96652 Presence of left artificial knee joint: Secondary | ICD-10-CM | POA: Diagnosis not present

## 2019-04-06 NOTE — Progress Notes (Signed)
ANNUAL FOLLOW UP FOR   TKA   Chief Complaint  Patient presents with  . Follow-up    Recheck on left total knee replacement, DOS 01-26-18.     HPI: The patient is here for the annual  follow-up x-ray for knee replacement. The patient is not complaining of pain weakness instability or stiffness in the repaired knee.   Review of Systems  Musculoskeletal:       Right knee pain  Right shoulder pain improved with injection     Examination of the left KNEE  BP 118/80   Pulse 63   Temp 97.6 F (36.4 C)   Ht 5\' 1"  (1.549 m)   Wt 155 lb (70.3 kg)   BMI 29.29 kg/m   General the patient is normally groomed in no distress  Inspection shows : incision healed nicely without erythema, no tenderness no swelling  Range of motion total range of motion is 120  Stability the knee is stable anterior to posterior as well as medial to lateral  Strength quadriceps strength is normal  Skin no erythema around the skin incision  Neuro: normal sensation in the operative leg   Gait: normal expected gait without cane    Medical decision-making section  X-rays ordered, internal imaging shows (see full dictated report) stable implant with no signs of loosening  Diagnosis  Encounter Diagnosis  Name Primary?  . S/P total knee replacement, left 01/26/18 Yes     Plan follow-up 1 year repeat x-rays

## 2019-06-09 ENCOUNTER — Other Ambulatory Visit: Payer: Self-pay | Admitting: Family Medicine

## 2019-07-25 ENCOUNTER — Other Ambulatory Visit: Payer: Self-pay

## 2019-07-25 ENCOUNTER — Ambulatory Visit (INDEPENDENT_AMBULATORY_CARE_PROVIDER_SITE_OTHER): Payer: BC Managed Care – PPO | Admitting: Family Medicine

## 2019-07-25 ENCOUNTER — Encounter: Payer: Self-pay | Admitting: Family Medicine

## 2019-07-25 VITALS — BP 122/68 | HR 70 | Temp 98.8°F | Resp 14 | Ht 61.0 in | Wt 167.0 lb

## 2019-07-25 DIAGNOSIS — I10 Essential (primary) hypertension: Secondary | ICD-10-CM | POA: Diagnosis not present

## 2019-07-25 DIAGNOSIS — E119 Type 2 diabetes mellitus without complications: Secondary | ICD-10-CM | POA: Diagnosis not present

## 2019-07-25 DIAGNOSIS — F331 Major depressive disorder, recurrent, moderate: Secondary | ICD-10-CM

## 2019-07-25 DIAGNOSIS — Z23 Encounter for immunization: Secondary | ICD-10-CM | POA: Diagnosis not present

## 2019-07-25 DIAGNOSIS — F411 Generalized anxiety disorder: Secondary | ICD-10-CM

## 2019-07-25 DIAGNOSIS — L659 Nonscarring hair loss, unspecified: Secondary | ICD-10-CM | POA: Diagnosis not present

## 2019-07-25 DIAGNOSIS — R238 Other skin changes: Secondary | ICD-10-CM

## 2019-07-25 MED ORDER — LISINOPRIL-HYDROCHLOROTHIAZIDE 10-12.5 MG PO TABS: 1.0000 | ORAL_TABLET | Freq: Every day | ORAL | 2 refills | Status: DC

## 2019-07-25 MED ORDER — ESCITALOPRAM OXALATE 10 MG PO TABS: 10.0000 mg | ORAL_TABLET | Freq: Every day | ORAL | 2 refills | Status: DC

## 2019-07-25 NOTE — Assessment & Plan Note (Addendum)
Depression with some anxiety.  We will increase her Lexapro to 10 mg once a day she does see some benefit with the medication.  Danielle Hicks psychotherapy at this time.  She does still use hydroxyzine as needed for itching especially when she feels like her "nerves are" are bad

## 2019-07-25 NOTE — Assessment & Plan Note (Signed)
Blood pressure is controlled no change in medications. 

## 2019-07-25 NOTE — Assessment & Plan Note (Signed)
Diabetes has been well controlled continue metformin.  Flu shot given.

## 2019-07-25 NOTE — Patient Instructions (Addendum)
Referral to dermatology Flu shot given  We will call with lab results Lexapro increased to 10mg  F/U 4 months

## 2019-07-25 NOTE — Progress Notes (Signed)
Subjective:    Patient ID: Danielle Hicks, female    DOB: 1956-08-13, 63 y.o.   MRN: [id number]  Patient presents for Depression (is currently taking Lexapro 5mg , but needs higher dose) and Itching (x4-5 months: generalized itching with some small whelps on back)  Patient had telephone visit back in April.  At that time she was experiencing itching rattling on her back and her trunk area but also in her scalp.  The trunk area has improved this occurs very randomly but she has intense itching almost daily in her scalp and now she has areas of hair loss especially on the edges but now in the center it is already thinning.  He denies any redness on the scalp denies any sores in the scalp.    MDD- has been stressed with family issues Lexapro has helped take some of the stress off but she would like to increase the dose.  States that family drama worsened since our last talk and she is just trying to handle it on her own.  She does not want to go back to a psychotherapist at this time.      DM-  Last 6.7% in March , CBG less than 120 fasting, has felt well, no hypglycemia symptoms, metformin 500mg  once a day         Review Of Systems:  GEN- denies fatigue, fever, weight loss,weakness, recent illness HEENT- denies eye drainage, change in vision, nasal discharge, CVS- denies chest pain, palpitations RESP- denies SOB, cough, wheeze ABD- denies N/V, change in stools, abd pain GU- denies dysuria, hematuria, dribbling, incontinence MSK- denies joint pain, muscle aches, injury Neuro- denies headache, dizziness, syncope, seizure activity       Objective:    BP 122/68   Pulse 70   Temp 98.8 F (37.1 C) (Oral)   Resp 14   Ht 5\' 1"  (1.549 m)   Wt 167 lb (75.8 kg)   SpO2 97%   BMI 31.55 kg/m  GEN- NAD, alert and oriented x3 HEENT- PERRL, EOMI, non injected sclera, pink conjunctiva, MMM, oropharynx clear Neck- Supple, no thyromegaly CVS- RRR, no murmur RESP-CTAB Skin - in tact   Psych- normal affect and mood, GAD 7 score 10 PHQ 9 score 6 EXT- No edema Pulses- Radial, DP- 2+        Assessment & Plan:      Problem List Items Addressed This Visit      High   Controlled type 2 diabetes mellitus without complication, without long-term current use of insulin (HCC)    Diabetes has been well controlled continue metformin.  Flu shot given.      Relevant Medications   lisinopril-hydrochlorothiazide (ZESTORETIC) 10-12.5 MG tablet   Other Relevant Orders   Hemoglobin A1c   Essential hypertension, benign - Primary    Blood pressure is controlled no change in medications.      Relevant Medications   lisinopril-hydrochlorothiazide (ZESTORETIC) 10-12.5 MG tablet   Other Relevant Orders   CBC with Differential/Platelet   Comprehensive metabolic panel     Medium   Major depressive disorder, recurrent episode ([place])    Depression with some anxiety.  We will increase her Lexapro to 10 mg once a day she does see some benefit with the medication.  [facility] psychotherapy at this time.  She does still use hydroxyzine as needed for itching especially when she feels like her "nerves are" are bad      Relevant Medications   escitalopram (LEXAPRO) 10 MG tablet  Unprioritized   GAD (generalized anxiety disorder)   Relevant Medications   escitalopram (LEXAPRO) 10 MG tablet    Other Visit Diagnoses    Alopecia       Need for immunization against influenza       Relevant Orders   Flu Vaccine QUAD 36+ mos IM (Completed)   Scalp irritation          Note: This dictation was prepared with Dragon dictation along with smaller phrase technology. Any transcriptional errors that result from this process are unintentional.

## 2019-07-26 LAB — CBC WITH DIFFERENTIAL/PLATELET
Absolute Monocytes: 458 cells/uL (ref 200–950)
Basophils Absolute: 70 cells/uL (ref 0–200)
Basophils Relative: 0.8 %
Eosinophils Absolute: 299 cells/uL (ref 15–500)
Eosinophils Relative: 3.4 %
HCT: 34.4 % — ABNORMAL LOW (ref 35.0–45.0)
Hemoglobin: 11.3 g/dL — ABNORMAL LOW (ref 11.7–15.5)
Lymphs Abs: 4066 cells/uL — ABNORMAL HIGH (ref 850–3900)
MCH: 26.7 pg — ABNORMAL LOW (ref 27.0–33.0)
MCHC: 32.8 g/dL (ref 32.0–36.0)
MCV: 81.3 fL (ref 80.0–100.0)
MPV: 11 fL (ref 7.5–12.5)
Monocytes Relative: 5.2 %
Neutro Abs: 3907 cells/uL (ref 1500–7800)
Neutrophils Relative %: 44.4 %
Platelets: 371 10*3/uL (ref 140–400)
RBC: 4.23 10*6/uL (ref 3.80–5.10)
RDW: 16.2 % — ABNORMAL HIGH (ref 11.0–15.0)
Total Lymphocyte: 46.2 %
WBC: 8.8 10*3/uL (ref 3.8–10.8)

## 2019-07-26 LAB — COMPREHENSIVE METABOLIC PANEL
AG Ratio: 1.6 (calc) (ref 1.0–2.5)
ALT: 10 U/L (ref 6–29)
AST: 16 U/L (ref 10–35)
Albumin: 4 g/dL (ref 3.6–5.1)
Alkaline phosphatase (APISO): 69 U/L (ref 37–153)
BUN: 12 mg/dL (ref 7–25)
CO2: 30 mmol/L (ref 20–32)
Calcium: 9.6 mg/dL (ref 8.6–10.4)
Chloride: 105 mmol/L (ref 98–110)
Creat: 0.88 mg/dL (ref 0.50–0.99)
Globulin: 2.5 g/dL (calc) (ref 1.9–3.7)
Glucose, Bld: 106 mg/dL — ABNORMAL HIGH (ref 65–99)
Potassium: 4.4 mmol/L (ref 3.5–5.3)
Sodium: 143 mmol/L (ref 135–146)
Total Bilirubin: 0.3 mg/dL (ref 0.2–1.2)
Total Protein: 6.5 g/dL (ref 6.1–8.1)

## 2019-07-26 LAB — HEMOGLOBIN A1C
Hgb A1c MFr Bld: 6.3 % of total Hgb — ABNORMAL HIGH (ref <5.7)
Mean Plasma Glucose: 134 (calc)
eAG (mmol/L): 7.4 (calc)

## 2019-07-27 ENCOUNTER — Encounter: Payer: Self-pay | Admitting: *Deleted

## 2019-08-08 ENCOUNTER — Telehealth: Payer: Self-pay | Admitting: Family Medicine

## 2019-08-08 NOTE — Telephone Encounter (Signed)
Patient calling to say that she needs refill on her metformin  But it needs to go to [facility] number is [phone]

## 2019-08-09 MED ORDER — METFORMIN HCL 500 MG PO TABS: 500.0000 mg | ORAL_TABLET | Freq: Every day | ORAL | 0 refills | Status: DC

## 2019-08-09 NOTE — Telephone Encounter (Signed)
Unable to find pharmacy on [facility]. Call placed to [facility] via number left by patient. Was advised that store does not have a pharmacy.   Call placed to patient. States that she would like refill sent to [facility] on [address]. Prescription sent to pharmacy.

## 2019-09-13 ENCOUNTER — Other Ambulatory Visit: Payer: Self-pay | Admitting: Family Medicine

## 2019-09-14 MED ORDER — METFORMIN HCL 500 MG PO TABS: 500.0000 mg | ORAL_TABLET | Freq: Every day | ORAL | 2 refills | Status: DC

## 2019-09-18 IMAGING — DX DG CERVICAL SPINE COMPLETE 4+V
6 series · 6 of 6 positions shown · non-contrast
Comparison: None.

CLINICAL DATA: Left-sided neck pain with left upper extremity
paresthesia.

EXAM:
CERVICAL SPINE - COMPLETE 4+ VIEW

[c-spine lat]
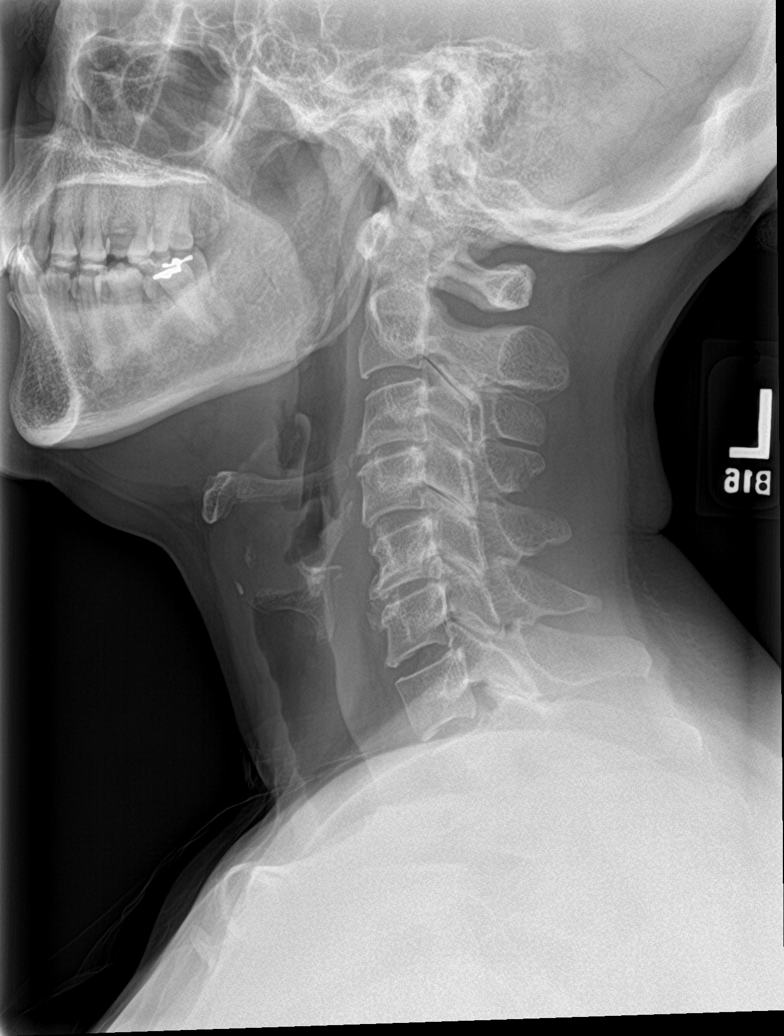

[c-spine obl (1 of 2)]
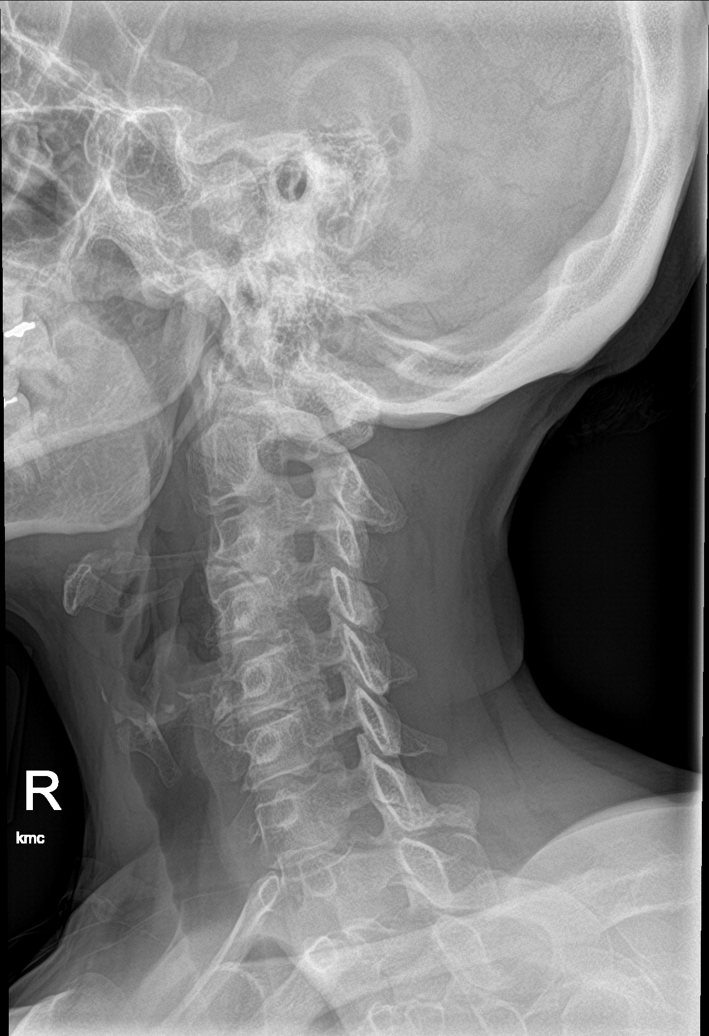

[c-spine obl (2 of 2)]
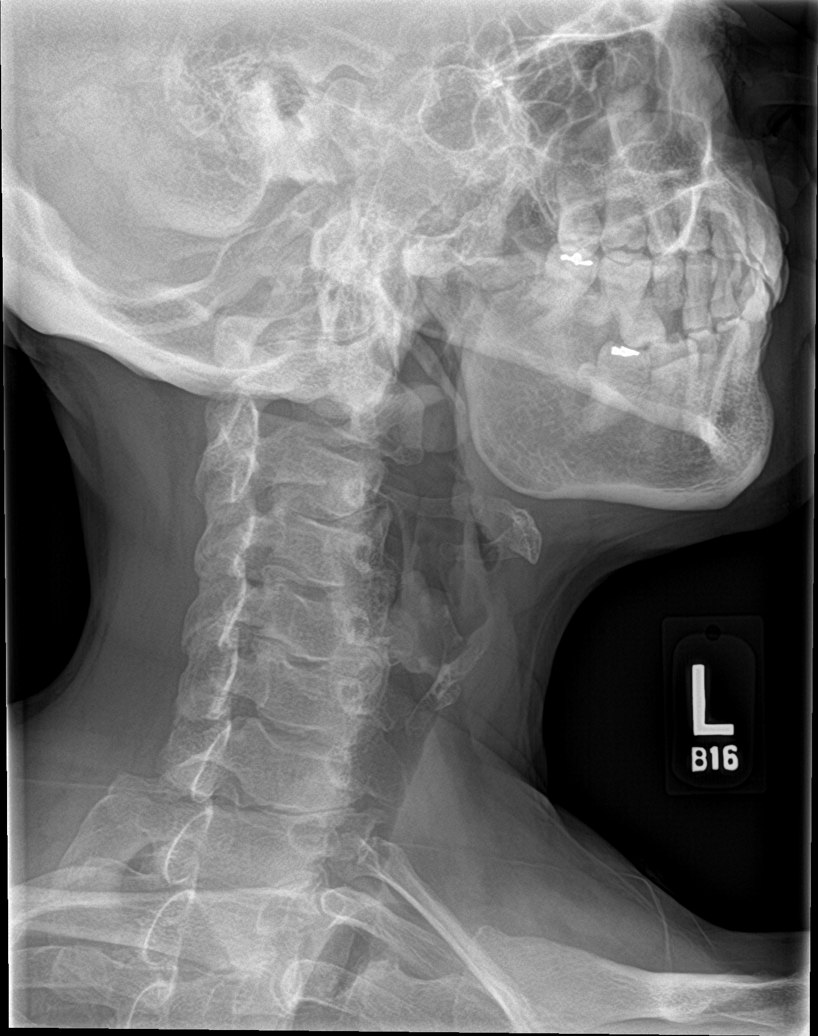

[c-spine ap]
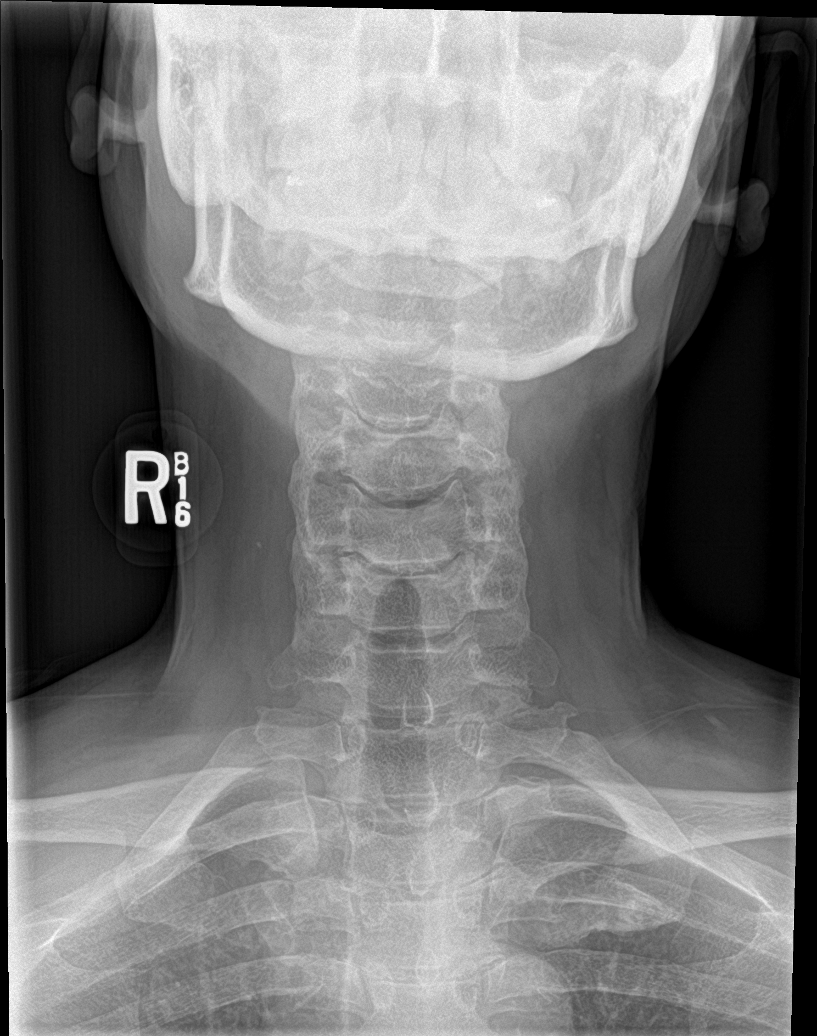

[c-spine open mouth (1 of 2)]
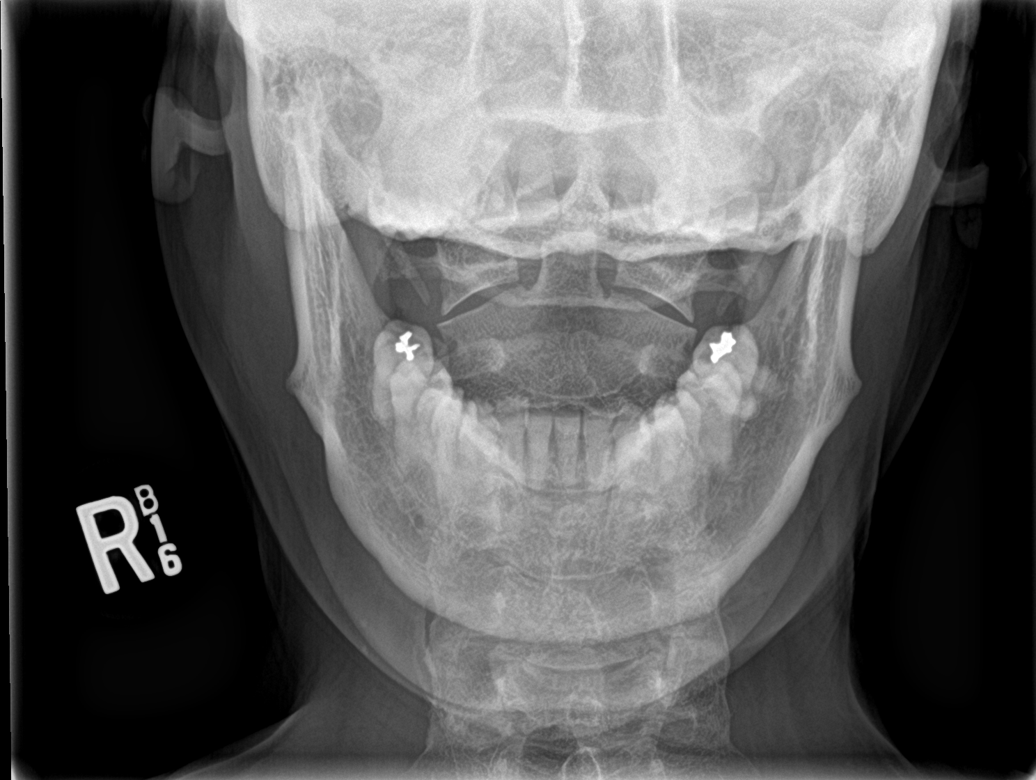

[c-spine open mouth (2 of 2)]
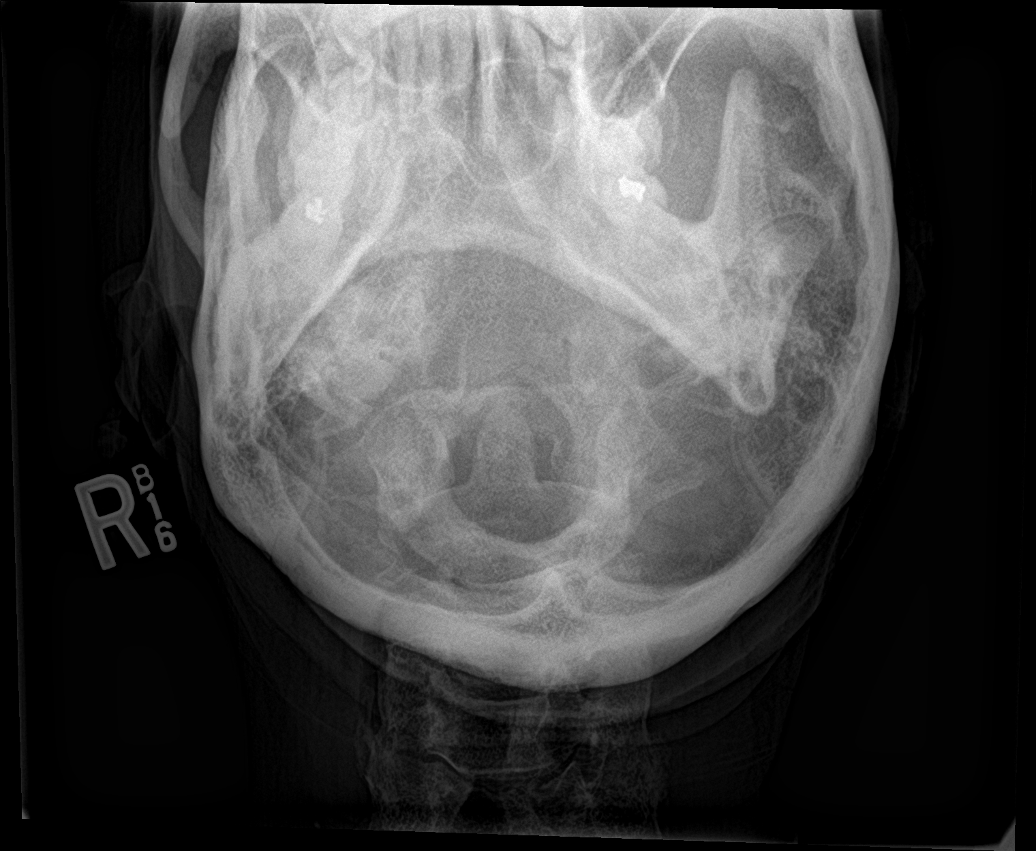

[6 of 6 positions shown; findings below may reference images not displayed]

FINDINGS: There is no evidence of cervical spine fracture or prevertebral soft
tissue swelling. Alignment is normal. There is slight disc space
narrowing at C3-4 and C5-6. Slight uncinate spurs to the right at
C5-6. Minimal right foraminal narrowing at C5-6. Slight left facet
arthritis at C4-5. Widely patent left neural foramina.
IMPRESSION: No acute abnormality. Degenerative disc disease primarily at C3-4
and C5-6 with slight right foraminal narrowing at C5-6.

## 2019-09-18 IMAGING — DX DG SHOULDER 2+V*L*
3 series · 3 of 3 positions shown · non-contrast
Comparison: None

CLINICAL DATA: Left shoulder pain.

EXAM:
LEFT SHOULDER - 2+ VIEW

[shoulder grashey]
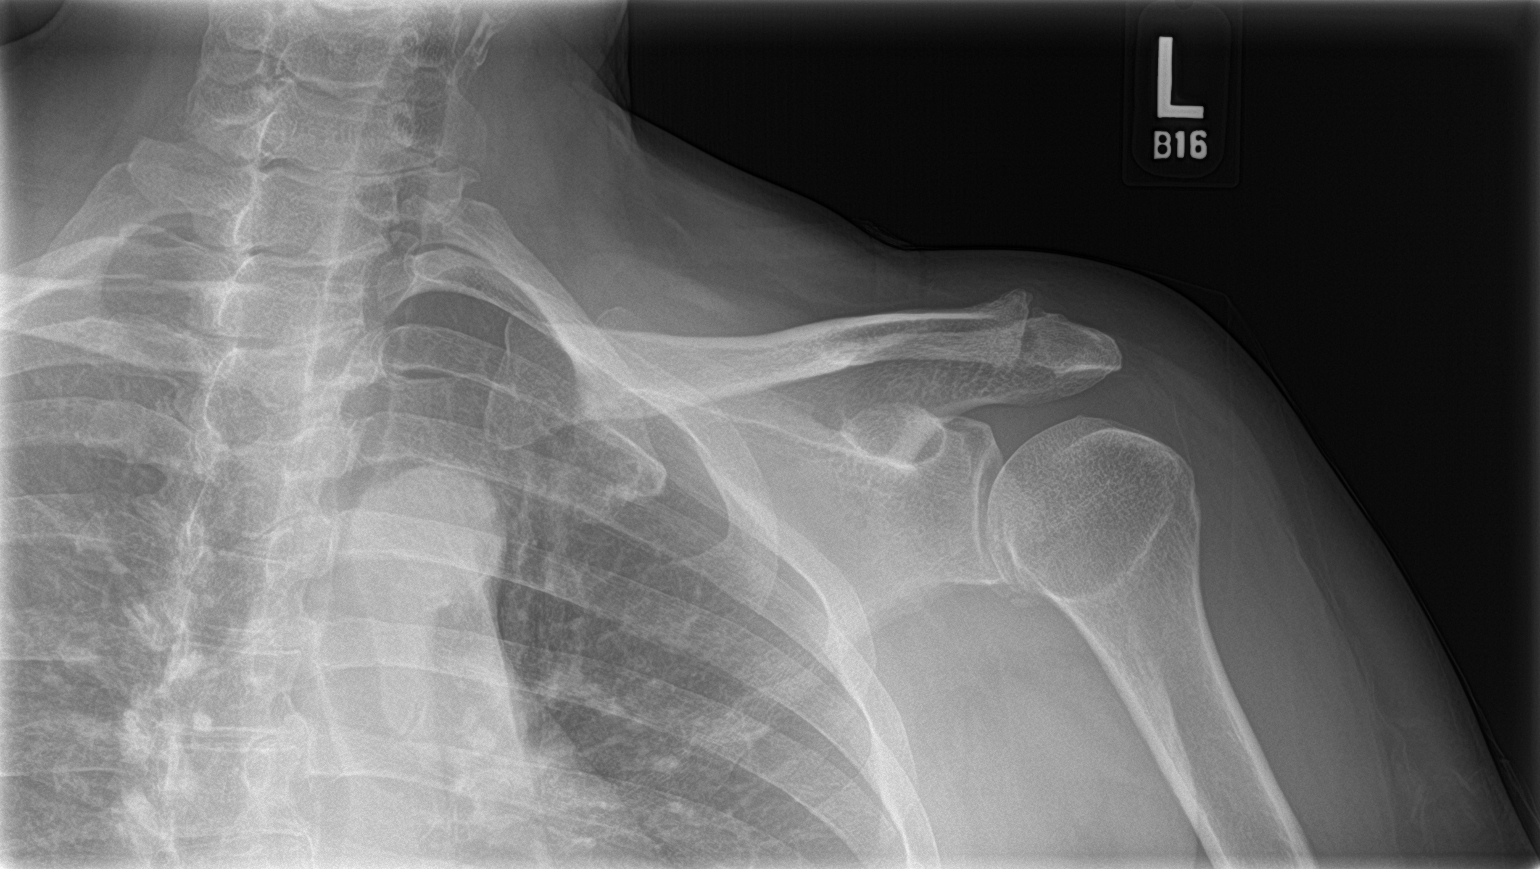

[shoulder y view]
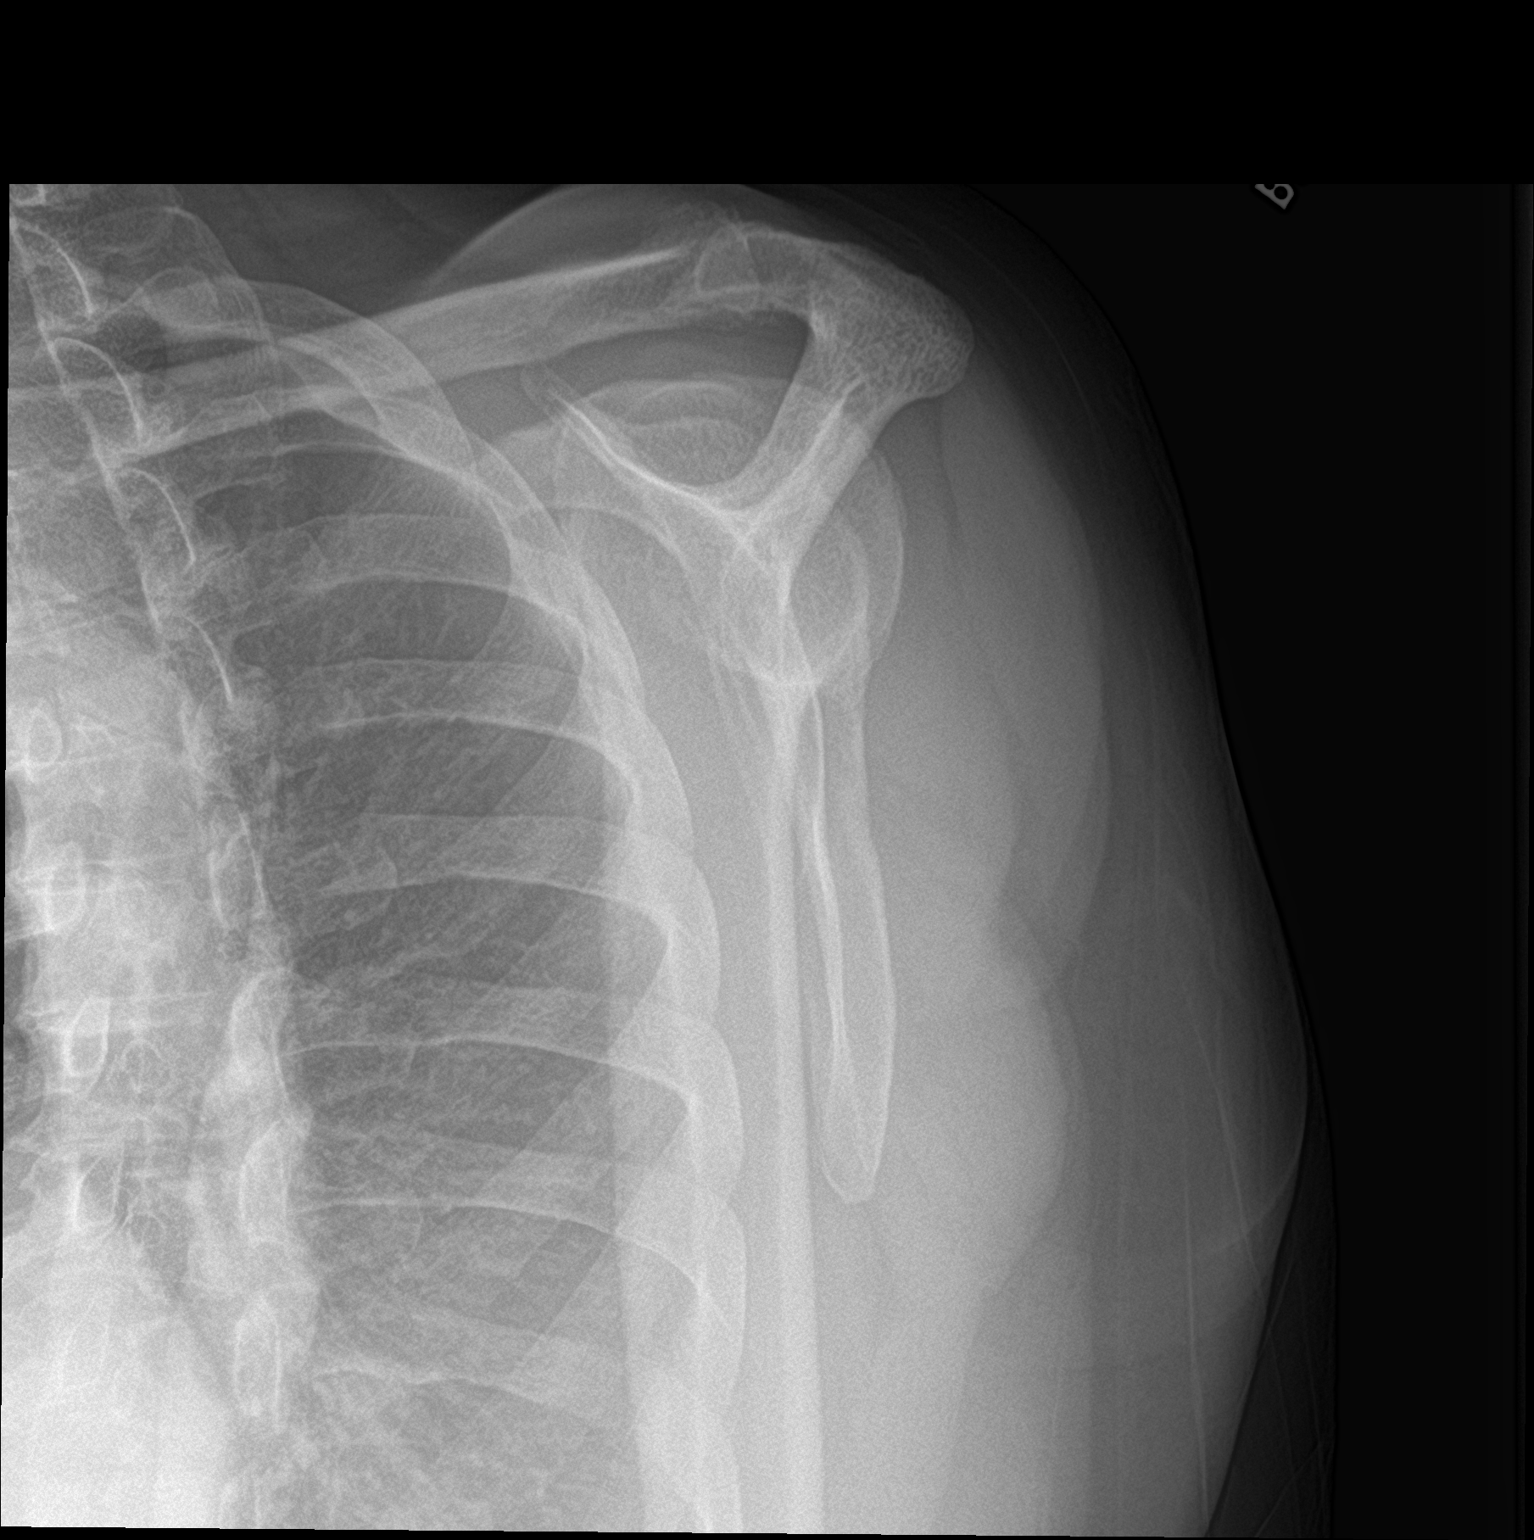

[shoulder axillary]
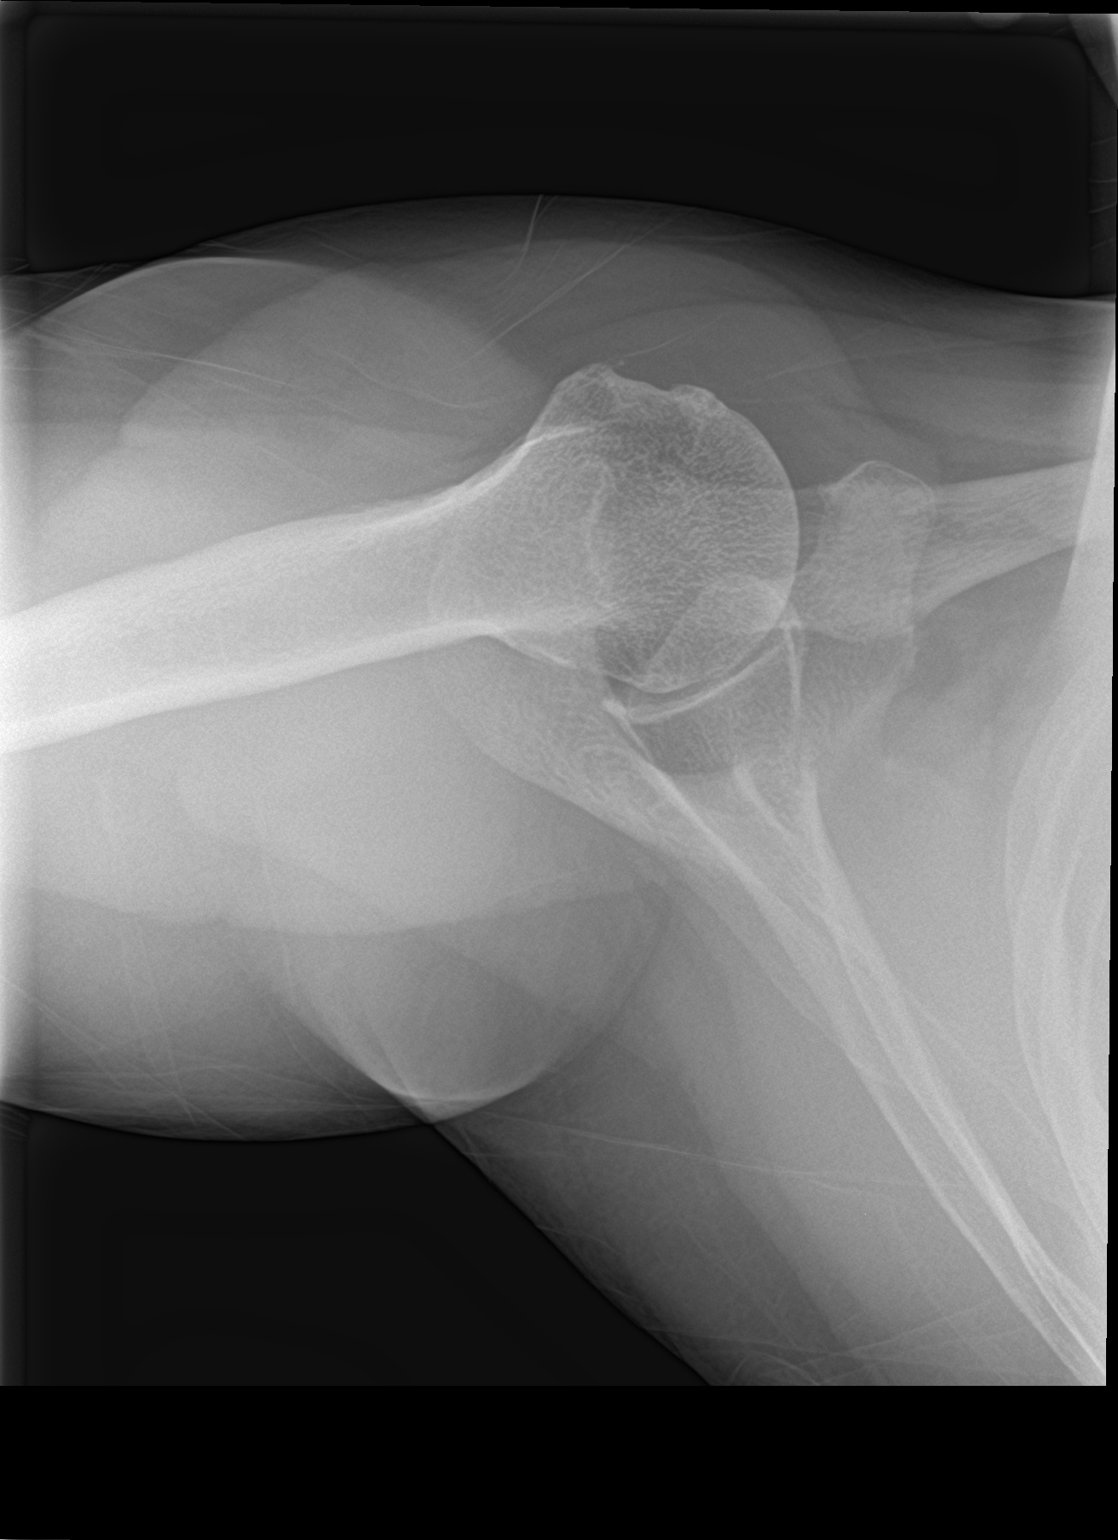

[3 of 3 positions shown; findings below may reference images not displayed]

FINDINGS: There is moderate degenerative changes at the acromioclavicular
joint. There is no acute fracture or dislocation identified. No
radio-opaque foreign bodies identified.
IMPRESSION: 1. AC joint osteoarthritis.

## 2019-11-03 ENCOUNTER — Other Ambulatory Visit: Payer: Self-pay | Admitting: Family Medicine

## 2019-11-08 ENCOUNTER — Encounter: Payer: Self-pay | Admitting: Orthopedic Surgery

## 2019-11-20 ENCOUNTER — Other Ambulatory Visit: Payer: Self-pay | Admitting: Family Medicine

## 2019-11-20 DIAGNOSIS — E119 Type 2 diabetes mellitus without complications: Secondary | ICD-10-CM

## 2019-12-05 LAB — HM MAMMOGRAPHY

## 2019-12-08 ENCOUNTER — Encounter: Payer: Self-pay | Admitting: *Deleted

## 2019-12-09 ENCOUNTER — Other Ambulatory Visit: Payer: Self-pay | Admitting: Family Medicine

## 2020-01-11 ENCOUNTER — Other Ambulatory Visit: Payer: Self-pay | Admitting: *Deleted

## 2020-01-11 DIAGNOSIS — E119 Type 2 diabetes mellitus without complications: Secondary | ICD-10-CM

## 2020-01-11 MED ORDER — LISINOPRIL-HYDROCHLOROTHIAZIDE 10-12.5 MG PO TABS: 1.0000 | ORAL_TABLET | Freq: Every day | ORAL | 3 refills | Status: DC

## 2020-01-11 MED ORDER — ESCITALOPRAM OXALATE 10 MG PO TABS: 10.0000 mg | ORAL_TABLET | Freq: Every day | ORAL | 3 refills | Status: DC

## 2020-01-11 MED ORDER — POTASSIUM CHLORIDE ER 10 MEQ PO TBCR: EXTENDED_RELEASE_TABLET | ORAL | 3 refills | Status: DC

## 2020-01-11 MED ORDER — METFORMIN HCL 500 MG PO TABS: ORAL_TABLET | ORAL | 3 refills | Status: DC

## 2020-01-11 MED ORDER — OMEPRAZOLE 40 MG PO CPDR: 40.0000 mg | DELAYED_RELEASE_CAPSULE | Freq: Every day | ORAL | 3 refills | Status: DC

## 2020-01-11 MED ORDER — AMLODIPINE BESYLATE 10 MG PO TABS: ORAL_TABLET | ORAL | 3 refills | Status: DC

## 2020-04-09 ENCOUNTER — Ambulatory Visit: Payer: BLUE CROSS/BLUE SHIELD | Admitting: Orthopedic Surgery

## 2020-04-11 ENCOUNTER — Other Ambulatory Visit: Payer: Self-pay

## 2020-04-11 ENCOUNTER — Ambulatory Visit: Payer: Self-pay

## 2020-04-11 ENCOUNTER — Ambulatory Visit (INDEPENDENT_AMBULATORY_CARE_PROVIDER_SITE_OTHER): Payer: BC Managed Care – PPO | Admitting: Orthopedic Surgery

## 2020-04-11 DIAGNOSIS — Z96652 Presence of left artificial knee joint: Secondary | ICD-10-CM | POA: Diagnosis not present

## 2020-04-11 DIAGNOSIS — M1712 Unilateral primary osteoarthritis, left knee: Secondary | ICD-10-CM

## 2020-04-11 DIAGNOSIS — M1710 Unilateral primary osteoarthritis, unspecified knee: Secondary | ICD-10-CM

## 2020-04-11 NOTE — Progress Notes (Signed)
Chief Complaint  Patient presents with  . Follow-up    Recheck on left knee, DOS 01-26-18.   63 years old 2 years status post left total knee  Implant [employer] fixed-bearing Sigma posterior stabilized total knee  Patient says functioning well without any symptoms.  Says she did notice noise in the knee at times  Exam shows a well-healed incision 120 degrees of flexion stable in all planes  X-ray shows no loosening  I demonstrated for her with a model cam post contact which she says sounds like when she was hearing  Follow-up in a year  Encounter Diagnoses  Name Primary?  . S/P total knee replacement, left 01/26/18 Yes  . Arthritis of knee

## 2020-06-21 ENCOUNTER — Other Ambulatory Visit: Payer: Self-pay | Admitting: *Deleted

## 2020-06-21 MED ORDER — OMEPRAZOLE 40 MG PO CPDR: 40.0000 mg | DELAYED_RELEASE_CAPSULE | Freq: Every day | ORAL | 0 refills | Status: DC

## 2020-07-20 ENCOUNTER — Ambulatory Visit: Payer: BC Managed Care – PPO | Admitting: Family Medicine

## 2020-07-20 ENCOUNTER — Other Ambulatory Visit: Payer: Self-pay

## 2020-07-20 VITALS — BP 122/70 | HR 66 | Temp 97.7°F | Resp 18 | Wt 169.2 lb

## 2020-07-20 DIAGNOSIS — J0100 Acute maxillary sinusitis, unspecified: Secondary | ICD-10-CM | POA: Diagnosis not present

## 2020-07-20 DIAGNOSIS — I10 Essential (primary) hypertension: Secondary | ICD-10-CM | POA: Diagnosis not present

## 2020-07-20 DIAGNOSIS — F411 Generalized anxiety disorder: Secondary | ICD-10-CM

## 2020-07-20 DIAGNOSIS — E119 Type 2 diabetes mellitus without complications: Secondary | ICD-10-CM

## 2020-07-20 MED ORDER — AZITHROMYCIN 250 MG PO TABS: ORAL_TABLET | ORAL | 0 refills | Status: DC

## 2020-07-20 NOTE — Patient Instructions (Signed)
Add flonase nasal spray Zyrtec or claritin Take antibiotics as prescribed  We will call with lab results F/U 6 months for Physical

## 2020-07-20 NOTE — Progress Notes (Signed)
   Subjective:    Patient ID: Danielle Hicks, female    DOB: Feb 07, 1956, 64 y.o.   MRN: [id number]  Patient presents for Sinusitis (cough, no drainage, having phlem, no fever, started x2 weeks, cough syrup was taking) and Follow-up   Pt here with sinus pressure, cough with mild production. No fever,normal taste and smell  No vomiting or diarrhea  Taking OTC cough med Occ feels lightheaaded  Vaccinated in April 2021.  DM- ast A1C 6.3% , taking metformin 500mg  daily   Her CBG this AM was  70, most are lower, she does not feel jittery or have other hypoglycemic symptoms  Eye appt scheduled for Oct    HTN- taking bp meds as prescribed, no SE  She retired and feels less stress  She didrecently return from [employer], there were  sick contacts, they tested negative for covid   Review Of Systems:  GEN- denies fatigue, fever, weight loss,weakness, recent illness HEENT- denies eye drainage, change in vision, +nasal discharge, CVS- denies chest pain, palpitations RESP- denies SOB, +cough, wheeze ABD- denies N/V, change in stools, abd pain GU- denies dysuria, hematuria, dribbling, incontinence MSK- denies joint pain, muscle aches, injury Neuro- denies headache, dizziness, syncope, seizure activity       Objective:    BP 122/70 (BP Location: Right Arm, Patient Position: Sitting, Cuff Size: Normal)   Pulse 66   Temp 97.7 F (36.5 C) (Temporal)   Resp 18   Wt 169 lb 3.2 oz (76.7 kg)   SpO2 98%   BMI 31.97 kg/m  GEN- NAD, alert and oriented x3 HEENT- PERRL, EOMI, non injected sclera, pink conjunctiva, nares clear, mild sinus pressure maxilary sinus, TM clear no effusion Neck- Supple, no thyromegaly CVS- RRR, no murmur RESP-CTAB ABD-NABS,soft,NT,ND Psych- normal affect and mood  EXT- No edema Pulses- Radial, DP- 2+        Assessment & Plan:      Problem List Items Addressed This Visit      High   Controlled type 2 diabetes mellitus without complication,  without long-term current use of insulin (HCC)    Diabetes has been wellcontrolled With her readings may be able to come off metformin, as she is getting low sugars      Relevant Orders   Hemoglobin A1c (Completed)   HM DIABETES FOOT EXAM (Completed)   Microalbumin / creatinine urine ratio (Completed)   Essential hypertension, benign - Primary    Well controlled no changes to meds Check fasting lab      Relevant Orders   CBC with Differential/Platelet (Completed)   Comprehensive metabolic panel (Completed)   Lipid panel (Completed)     Unprioritized   GAD (generalized anxiety disorder)    Continue lexapro       Other Visit Diagnoses    Acute maxillary sinusitis, recurrence not specified       will test for COVID, continue allergy meds, nasal sprays, add zpak   Relevant Medications   azithromycin (ZITHROMAX) 250 MG tablet   Other Relevant Orders   SARS-COV-2 RNA,(COVID-19) QUAL NAAT (Completed)      Note: This dictation was prepared with Dragon dictation along with smaller phrase technology. Any transcriptional errors that result from this process are unintentional.

## 2020-07-21 LAB — COMPREHENSIVE METABOLIC PANEL
AG Ratio: 1.2 (calc) (ref 1.0–2.5)
ALT: 6 U/L (ref 6–29)
AST: 8 U/L — ABNORMAL LOW (ref 10–35)
Albumin: 3.8 g/dL (ref 3.6–5.1)
Alkaline phosphatase (APISO): 106 U/L (ref 37–153)
BUN: 11 mg/dL (ref 7–25)
CO2: 25 mmol/L (ref 20–32)
Calcium: 9.4 mg/dL (ref 8.6–10.4)
Chloride: 98 mmol/L (ref 98–110)
Creat: 0.98 mg/dL (ref 0.50–0.99)
Globulin: 3.1 g/dL (calc) (ref 1.9–3.7)
Glucose, Bld: 442 mg/dL — ABNORMAL HIGH (ref 65–99)
Potassium: 4.5 mmol/L (ref 3.5–5.3)
Sodium: 135 mmol/L (ref 135–146)
Total Bilirubin: 0.5 mg/dL (ref 0.2–1.2)
Total Protein: 6.9 g/dL (ref 6.1–8.1)

## 2020-07-21 LAB — CBC WITH DIFFERENTIAL/PLATELET
Absolute Monocytes: 366 cells/uL (ref 200–950)
Basophils Absolute: 68 cells/uL (ref 0–200)
Basophils Relative: 0.8 %
Eosinophils Absolute: 179 cells/uL (ref 15–500)
Eosinophils Relative: 2.1 %
HCT: 39.6 % (ref 35.0–45.0)
Hemoglobin: 12.6 g/dL (ref 11.7–15.5)
Lymphs Abs: 2763 cells/uL (ref 850–3900)
MCH: 26.3 pg — ABNORMAL LOW (ref 27.0–33.0)
MCHC: 31.8 g/dL — ABNORMAL LOW (ref 32.0–36.0)
MCV: 82.7 fL (ref 80.0–100.0)
MPV: 11.1 fL (ref 7.5–12.5)
Monocytes Relative: 4.3 %
Neutro Abs: 5126 cells/uL (ref 1500–7800)
Neutrophils Relative %: 60.3 %
Platelets: 386 10*3/uL (ref 140–400)
RBC: 4.79 10*6/uL (ref 3.80–5.10)
RDW: 15.3 % — ABNORMAL HIGH (ref 11.0–15.0)
Total Lymphocyte: 32.5 %
WBC: 8.5 10*3/uL (ref 3.8–10.8)

## 2020-07-21 LAB — LIPID PANEL
Cholesterol: 187 mg/dL (ref <200)
HDL: 49 mg/dL — ABNORMAL LOW (ref >=50)
LDL Cholesterol (Calc): 111 mg/dL (calc) — ABNORMAL HIGH
Non-HDL Cholesterol (Calc): 138 mg/dL (calc) — ABNORMAL HIGH (ref <130)
Total CHOL/HDL Ratio: 3.8 (calc) (ref <5.0)
Triglycerides: 152 mg/dL — ABNORMAL HIGH (ref <150)

## 2020-07-21 LAB — MICROALBUMIN / CREATININE URINE RATIO
Creatinine, Urine: 90 mg/dL (ref 20–275)
Microalb Creat Ratio: 8 mcg/mg creat (ref <30)
Microalb, Ur: 0.7 mg/dL

## 2020-07-21 LAB — HEMOGLOBIN A1C
Hgb A1c MFr Bld: 10.7 % of total Hgb — ABNORMAL HIGH (ref <5.7)
Mean Plasma Glucose: 260 (calc)
eAG (mmol/L): 14.4 (calc)

## 2020-07-21 LAB — SARS-COV-2 RNA,(COVID-19) QUALITATIVE NAAT: SARS CoV2 RNA: NOT DETECTED

## 2020-07-22 ENCOUNTER — Encounter: Payer: Self-pay | Admitting: Family Medicine

## 2020-07-22 NOTE — Assessment & Plan Note (Signed)
Continue lexapro  ?

## 2020-07-22 NOTE — Assessment & Plan Note (Signed)
Well controlled no changes to meds Check fasting lab

## 2020-07-22 NOTE — Assessment & Plan Note (Signed)
Diabetes has been wellcontrolled With her readings may be able to come off metformin, as she is getting low sugars

## 2020-07-23 ENCOUNTER — Other Ambulatory Visit: Payer: Self-pay | Admitting: Family Medicine

## 2020-07-23 ENCOUNTER — Other Ambulatory Visit: Payer: Self-pay | Admitting: *Deleted

## 2020-07-23 MED ORDER — METFORMIN HCL 500 MG PO TABS
500.0000 mg | ORAL_TABLET | Freq: Two times a day (BID) | ORAL | 3 refills | Status: DC
Start: 2020-07-23 — End: 2020-08-29

## 2020-07-24 ENCOUNTER — Telehealth: Payer: Self-pay | Admitting: Orthopedic Surgery

## 2020-07-24 MED ORDER — CLINDAMYCIN HCL 150 MG PO CAPS: 600.0000 mg | ORAL_CAPSULE | Freq: Once | ORAL | 2 refills | Status: AC

## 2020-07-24 NOTE — Telephone Encounter (Signed)
Yes, sent in per protocol  Called to advise.

## 2020-07-24 NOTE — Telephone Encounter (Signed)
Call just received from patient's dental office, Dr Laretta Alstrom; per [facility], asking "for going forward" if patient would need pre-antibiotics.  Relays that patient has just left, and had a dental cleaning done.  Per chart, patient had a total knee replacement on 01/26/18. Please call [phone]

## 2020-07-24 NOTE — Telephone Encounter (Signed)
She wanted me to fax it I have faxed this note to her.

## 2020-08-27 ENCOUNTER — Ambulatory Visit: Payer: Self-pay | Admitting: Family Medicine

## 2020-08-27 LAB — HM DIABETES EYE EXAM

## 2020-08-29 ENCOUNTER — Encounter: Payer: Self-pay | Admitting: Family Medicine

## 2020-08-29 ENCOUNTER — Other Ambulatory Visit: Payer: Self-pay

## 2020-08-29 ENCOUNTER — Ambulatory Visit: Payer: BC Managed Care – PPO | Admitting: Family Medicine

## 2020-08-29 VITALS — BP 120/80 | HR 80 | Temp 98.8°F | Ht 60.0 in | Wt 163.8 lb

## 2020-08-29 DIAGNOSIS — Z23 Encounter for immunization: Secondary | ICD-10-CM

## 2020-08-29 DIAGNOSIS — E119 Type 2 diabetes mellitus without complications: Secondary | ICD-10-CM

## 2020-08-29 MED ORDER — METFORMIN HCL 500 MG PO TABS
1000.0000 mg | ORAL_TABLET | Freq: Two times a day (BID) | ORAL | 3 refills | Status: DC
Start: 2020-08-29 — End: 2020-11-01

## 2020-08-29 NOTE — Assessment & Plan Note (Signed)
Increase metformin to 1000mg  twice a day Continue reducing carbs and sugar in diet Recheck A1C in 2 months Keep checking CBG fasting

## 2020-08-29 NOTE — Progress Notes (Signed)
   Subjective:    Patient ID: Danielle Hicks, female    DOB: 08-Apr-1956, 64 y.o.   MRN: [id number]  Patient presents for Follow-up (Diabetes)  Pt here for intermin F/U visit on Diabetes mellitus Her A1C returned at  10.7% in Sept Her metformin was increased to 500mg  twice a day CBG at home have improved some, still elevated , her CBG the lowest  200's, after eating Japans/chinese was 400  She is drinking soda small can every 2-3 days Trying to increase water  She has cut down on snacks and cookies Coffee using sugar free creamer now  Hyperlipidemia- cholesterol also increased some    [facility] - she had recent visit   Flu shot due today    Review Of Systems:  GEN- denies fatigue, fever, weight loss,weakness, recent illness HEENT- denies eye drainage, change in vision, nasal discharge, CVS- denies chest pain, palpitations RESP- denies SOB, cough, wheeze ABD- denies N/V, change in stools, abd pain Neuro- denies headache, dizziness, syncope, seizure activity       Objective:    BP 120/80 (BP Location: Right Arm, Patient Position: Sitting, Cuff Size: Normal)   Pulse 80   Temp 98.8 F (37.1 C) (Oral)   Ht 5' (1.524 m)   Wt 163 lb 12.8 oz (74.3 kg)   SpO2 97%   BMI 31.99 kg/m  GEN- NAD, alert and oriented x3 HEENT- PERRL, EOMI, non injected sclera, pink conjunctiva, CVS- RRR, no murmur RESP-CTAB EXT- No edema Pulses- Radial 2+        Assessment & Plan:      Problem List Items Addressed This Visit      High   Controlled type 2 diabetes mellitus without complication, without long-term current use of insulin (HCC)    Increase metformin to 1000mg  twice a day Continue reducing carbs and sugar in diet Recheck A1C in 2 months Keep checking CBG fasting       Relevant Medications   metFORMIN (GLUCOPHAGE) 500 MG tablet    Other Visit Diagnoses    Need for immunization against influenza    -  Primary   Relevant Orders   Flu Vaccine QUAD 36+ mos IM  (Completed)      Note: This dictation was prepared with Dragon dictation along with smaller phrase technology. Any transcriptional errors that result from this process are unintentional.

## 2020-08-29 NOTE — Patient Instructions (Addendum)
Increase metformin to 1000mg  twice a day,you can take 2 of the 500mg  twice a day with meals Have protein with each meal  F/U 2 months

## 2020-10-15 ENCOUNTER — Other Ambulatory Visit: Payer: Self-pay | Admitting: Family Medicine

## 2020-10-15 DIAGNOSIS — E119 Type 2 diabetes mellitus without complications: Secondary | ICD-10-CM

## 2020-10-29 ENCOUNTER — Encounter: Payer: Self-pay | Admitting: Family Medicine

## 2020-10-29 ENCOUNTER — Ambulatory Visit (INDEPENDENT_AMBULATORY_CARE_PROVIDER_SITE_OTHER): Payer: BC Managed Care – PPO | Admitting: Family Medicine

## 2020-10-29 ENCOUNTER — Other Ambulatory Visit: Payer: Self-pay

## 2020-10-29 VITALS — BP 124/72 | HR 82 | Temp 98.1°F | Resp 14 | Ht 60.0 in | Wt 158.0 lb

## 2020-10-29 DIAGNOSIS — E119 Type 2 diabetes mellitus without complications: Secondary | ICD-10-CM

## 2020-10-29 DIAGNOSIS — E669 Obesity, unspecified: Secondary | ICD-10-CM | POA: Diagnosis not present

## 2020-10-29 DIAGNOSIS — I10 Essential (primary) hypertension: Secondary | ICD-10-CM

## 2020-10-29 NOTE — Progress Notes (Signed)
   Subjective:    Patient ID: Danielle Hicks, female    DOB: 1956/10/27, 64 y.o.   MRN: [id number]  Patient presents for Follow-up (DM)     Pt here for intermin F/U visit on Diabetes mellitus Her A1C returned at  10.7% in Sept Her metformin was increased to 1000mg  twice a day CBG at home have improved some,  Trying to increase water  She has cut down on snacks and cookies  Due for recheck today on A1C  She has been fasting the past 6 hours   Still has some itching in spot on the spot on scalp, hair is not falling out  Only has itching ay night , vaseline has helped, no sores in scalp, has seen dermatologist  She had 1 episode of blood after a hard stool, small amount, none since, reviewed colonoscopy 2019, hemorrhoids noted  Review Of Systems:  GEN- denies fatigue, fever, weight loss,weakness, recent illness HEENT- denies eye drainage, change in vision, nasal discharge, CVS- denies chest pain, palpitations RESP- denies SOB, cough, wheeze ABD- denies N/V, change in stools, abd pain GU- denies dysuria, hematuria, dribbling, incontinence MSK- denies joint pain, muscle aches, injury Neuro- denies headache, dizziness, syncope, seizure activity       Objective:    BP 124/72   Pulse 82   Temp 98.1 F (36.7 C) (Temporal)   Resp 14   Ht 5' (1.524 m)   Wt 158 lb (71.7 kg)   SpO2 98%   BMI 30.86 kg/m  GEN- NAD, alert and oriented x3 HEENT- PERRL, EOMI, non injected sclera, pink conjunctiva, MMM, oropharynx clear Neck- Supple, no thyromegaly CVS- RRR, no murmur RESP-CTAB ABD-NABS,soft,NT,ND SCALP- NO LESIONS noted, hair in tact  EXT- No edema Pulses- Radial, DP- 2+        Assessment & Plan:     continue topical ointment per dermatology to scalp/vaseline   Problem List Items Addressed This Visit      High   Controlled type 2 diabetes mellitus without complication, without long-term current use of insulin (HCC)    Continue metformin, fasting CBG much  improved Recheck A1Cgoal < 7% Check metabolic panel  She is on ACEI Recheck lipids, goal LDL less than 100      Relevant Orders   Basic metabolic panel   Hemoglobin A1c   Lipid panel   Essential hypertension, benign - Primary    Controlled no changes       Relevant Orders   Basic metabolic panel   Hemoglobin A1c     Unprioritized   Obesity (BMI 30.0-34.9)    Weight loss noted with dietary changes          Note: This dictation was prepared with Dragon dictation along with smaller phrase technology. Any transcriptional errors that result from this process are unintentional.

## 2020-10-29 NOTE — Patient Instructions (Signed)
F/U  3 months 

## 2020-10-29 NOTE — Assessment & Plan Note (Signed)
Controlled no changes 

## 2020-10-29 NOTE — Assessment & Plan Note (Signed)
Continue metformin, fasting CBG much improved Recheck A1Cgoal < 7% Check metabolic panel  She is on ACEI Recheck lipids, goal LDL less than 100

## 2020-10-29 NOTE — Assessment & Plan Note (Signed)
Weight loss noted with dietary changes

## 2020-10-30 LAB — LIPID PANEL
Cholesterol: 223 mg/dL — ABNORMAL HIGH (ref <200)
HDL: 50 mg/dL (ref >=50)
LDL Cholesterol (Calc): 144 mg/dL (calc) — ABNORMAL HIGH
Non-HDL Cholesterol (Calc): 173 mg/dL (calc) — ABNORMAL HIGH (ref <130)
Total CHOL/HDL Ratio: 4.5 (calc) (ref <5.0)
Triglycerides: 155 mg/dL — ABNORMAL HIGH (ref <150)

## 2020-10-30 LAB — HEMOGLOBIN A1C
Hgb A1c MFr Bld: 10.6 % of total Hgb — ABNORMAL HIGH (ref <5.7)
Mean Plasma Glucose: 258 mg/dL
eAG (mmol/L): 14.3 mmol/L

## 2020-10-30 LAB — BASIC METABOLIC PANEL
BUN: 8 mg/dL (ref 7–25)
CO2: 29 mmol/L (ref 20–32)
Calcium: 9.5 mg/dL (ref 8.6–10.4)
Chloride: 104 mmol/L (ref 98–110)
Creat: 0.78 mg/dL (ref 0.50–0.99)
Glucose, Bld: 200 mg/dL — ABNORMAL HIGH (ref 65–99)
Potassium: 3.8 mmol/L (ref 3.5–5.3)
Sodium: 139 mmol/L (ref 135–146)

## 2020-11-01 ENCOUNTER — Other Ambulatory Visit: Payer: Self-pay

## 2020-11-01 DIAGNOSIS — E119 Type 2 diabetes mellitus without complications: Secondary | ICD-10-CM

## 2020-11-01 DIAGNOSIS — E7800 Pure hypercholesterolemia, unspecified: Secondary | ICD-10-CM

## 2020-11-01 MED ORDER — METFORMIN HCL 500 MG PO TABS: 1000.0000 mg | ORAL_TABLET | Freq: Two times a day (BID) | ORAL | 3 refills | Status: DC

## 2020-11-01 MED ORDER — PRAVASTATIN SODIUM 20 MG PO TABS: 20.0000 mg | ORAL_TABLET | Freq: Every day | ORAL | 0 refills | Status: DC

## 2020-11-01 MED ORDER — DAPAGLIFLOZIN PROPANEDIOL 10 MG PO TABS: 10.0000 mg | ORAL_TABLET | Freq: Every day | ORAL | 1 refills | Status: DC

## 2020-11-04 ENCOUNTER — Other Ambulatory Visit: Payer: Self-pay | Admitting: Family Medicine

## 2020-12-07 ENCOUNTER — Other Ambulatory Visit: Payer: Self-pay | Admitting: *Deleted

## 2020-12-07 DIAGNOSIS — E119 Type 2 diabetes mellitus without complications: Secondary | ICD-10-CM

## 2020-12-07 MED ORDER — METFORMIN HCL 500 MG PO TABS: 1000.0000 mg | ORAL_TABLET | Freq: Two times a day (BID) | ORAL | 3 refills | Status: DC

## 2020-12-24 ENCOUNTER — Other Ambulatory Visit: Payer: Self-pay | Admitting: Family Medicine

## 2020-12-24 DIAGNOSIS — E119 Type 2 diabetes mellitus without complications: Secondary | ICD-10-CM

## 2021-01-01 ENCOUNTER — Other Ambulatory Visit: Payer: Self-pay

## 2021-01-01 ENCOUNTER — Encounter: Payer: Self-pay | Admitting: Internal Medicine

## 2021-01-01 ENCOUNTER — Ambulatory Visit: Payer: BC Managed Care – PPO | Admitting: Internal Medicine

## 2021-01-01 VITALS — BP 108/75 | HR 78 | Temp 97.7°F | Ht 61.0 in | Wt 154.0 lb

## 2021-01-01 DIAGNOSIS — F331 Major depressive disorder, recurrent, moderate: Secondary | ICD-10-CM | POA: Diagnosis not present

## 2021-01-01 DIAGNOSIS — I10 Essential (primary) hypertension: Secondary | ICD-10-CM | POA: Diagnosis not present

## 2021-01-01 DIAGNOSIS — Z7689 Persons encountering health services in other specified circumstances: Secondary | ICD-10-CM | POA: Diagnosis not present

## 2021-01-01 DIAGNOSIS — E119 Type 2 diabetes mellitus without complications: Secondary | ICD-10-CM

## 2021-01-01 DIAGNOSIS — Z720 Tobacco use: Secondary | ICD-10-CM

## 2021-01-01 DIAGNOSIS — E785 Hyperlipidemia, unspecified: Secondary | ICD-10-CM

## 2021-01-01 NOTE — Progress Notes (Signed)
New Patient Office Visit  Subjective:  Patient ID: FOREVER ARECHIGA, female    DOB: Nov 03, 1956  Age: 65 y.o. MRN: [id number]  CC:  Chief Complaint  Patient presents with  . New Patient (Initial Visit)    Here to establish care. Wants to discuss medications to see what she could stop taking. Preferably Metformin and Lexapro.    HPI Danielle Hicks is a 65 year old female with PMH of HTN, type 2 DM, GERD and tobacco abuse who presents for establishing care. She is a former patient of Dr Buelah Manis.  BP is well-controlled. Takes medications regularly. Patient denies headache, dizziness, chest pain, dyspnea or palpitations.  Her HbA1C was 10.6, and Metformin dose was increased when she went to PCP visit in 10/2020. She states that her blood glucose in AM ranges around 100-120. She denies any polyuria or polydipsia.  She has been taking Lexapro since she lost her mother, but she states that she feels well and wants to discontinue it. She denies any SI or HI.  She is up-to-date with COVID and flu vaccine.   Past Medical History:  Diagnosis Date  . Arthritis   . GERD (gastroesophageal reflux disease)   . Headache   . Hypertension   . Type 2 diabetes mellitus ([place])     Past Surgical History:  Procedure Laterality Date  . ABDOMINAL HYSTERECTOMY    . BLEPHAROPLASTY Bilateral   . COLONOSCOPY N/A 06/17/2018   Procedure: COLONOSCOPY;  Surgeon: Rogene Houston, MD;  [facility];  Service: Endoscopy;  Laterality: N/A;  12:25  . HEMORRHOID SURGERY    . POLYPECTOMY  06/17/2018   Procedure: POLYPECTOMY;  Surgeon: Rogene Houston, MD;  Location: [facility];  Service: Endoscopy;;  colon  . TOTAL KNEE ARTHROPLASTY Left 01/26/2018   Procedure: TOTAL KNEE ARTHROPLASTY;  Surgeon: Carole Civil, MD;  Location: [facility];  Service: Orthopedics;  Laterality: Left;    Family History  Problem Relation Age of Onset  . Diabetes Mother   . Hypertension Mother   . Stroke Sister      Social History   Socioeconomic History  . Marital status: Married    Spouse name: Not on file  . Number of children: Not on file  . Years of education: Not on file  . Highest education level: Not on file  Occupational History  . Not on file  Tobacco Use  . Smoking status: Current Some Day Smoker    Types: Cigarettes  . Smokeless tobacco: Never Used  . Tobacco comment: trying to quit   Vaping Use  . Vaping Use: Never used  Substance and Sexual Activity  . Alcohol use: No    Alcohol/week: 0.0 standard drinks  . Drug use: No  . Sexual activity: Yes  Other Topics Concern  . Not on file  Social History Narrative  . Not on file   Social Determinants of Health   Financial Resource Strain: Not on file  Food Insecurity: Not on file  Transportation Needs: Not on file  Physical Activity: Not on file  Stress: Not on file  Social Connections: Not on file  Intimate Partner Violence: Not on file    ROS Review of Systems  Constitutional: Negative for chills and fever.  HENT: Negative for congestion, sinus pressure, sinus pain and sore throat.   Eyes: Negative for pain and discharge.  Respiratory: Negative for cough and shortness of breath.   Cardiovascular: Negative for chest pain and palpitations.  Gastrointestinal: Negative  for abdominal pain, constipation, diarrhea, nausea and vomiting.  Endocrine: Negative for polydipsia and polyuria.  Genitourinary: Negative for dysuria and hematuria.  Musculoskeletal: Negative for neck pain and neck stiffness.  Skin: Negative for rash.  Neurological: Negative for dizziness and weakness.  Psychiatric/Behavioral: Negative for agitation and behavioral problems.    Objective:   Today's Vitals: BP 108/75 (BP Location: Right Arm, Patient Position: Sitting, Cuff Size: Normal)   Pulse 78   Temp 97.7 F (36.5 C) (Temporal)   Ht _0  (1.549 m)   Wt 154 lb (69.9 kg)   SpO2 97%   BMI 29.10 kg/m   Physical Exam Vitals reviewed.   Constitutional:      General: She is not in acute distress.    Appearance: She is not diaphoretic.  HENT:     Head: Normocephalic and atraumatic.     Nose: Nose normal.     Mouth/Throat:     Mouth: Mucous membranes are moist.  Eyes:     General: No scleral icterus.    Extraocular Movements: Extraocular movements intact.     Pupils: Pupils are equal, round, and reactive to light.  Cardiovascular:     Rate and Rhythm: Normal rate and regular rhythm.     Pulses: Normal pulses.     Heart sounds: Normal heart sounds. No murmur heard.   Pulmonary:     Breath sounds: Normal breath sounds. No wheezing or rales.  Abdominal:     Palpations: Abdomen is soft.     Tenderness: There is no abdominal tenderness.  Musculoskeletal:     Cervical back: Neck supple. No tenderness.     Right lower leg: No edema.     Left lower leg: No edema.  Skin:    General: Skin is warm.     Findings: No rash.  Neurological:     General: No focal deficit present.     Mental Status: She is alert and oriented to person, place, and time.     Sensory: No sensory deficit.     Motor: No weakness.  Psychiatric:        Mood and Affect: Mood normal.        Behavior: Behavior normal.      Assessment & Plan:   Problem List Items Addressed This Visit      Cardiovascular and Mediastinum   Essential hypertension, benign    BP Readings from Last 1 Encounters:  01/01/21 108/75   Well-controlled with Amlodipine and Zestoretic If BP stable in next visit, DC HCTZ and potassium supplement Counseled for compliance with the medications Advised DASH diet and moderate exercise/walking, at least 150 mins/week         Endocrine   DM (diabetes mellitus) ([place])    HbA1C: 10.6 On Metformin and Farxiga Advised to follow diabetic diet On statin (plan to change it to Crestor after checking lipid profile) On ACEi F/u CMP and lipid panel Diabetic eye exam: Advised to follow up with Ophthalmology for diabetic eye  exam       Relevant Orders   CMP14+EGFR   Hemoglobin A1c     Other   Tobacco use    Smokes 0.5 pack/day  Asked about quitting: confirms that she currently smokes cigarettes Advise to quit smoking: Educated about QUITTING to reduce the risk of cancer, cardio and cerebrovascular disease. Assess willingness: Unwilling to quit at this time, but is working on cutting back. Assist with counseling and pharmacotherapy: Counseled for 5 minutes and literature provided. Arrange for  follow up: Follow up in 3 months and continue to offer help.      Major depressive disorder, recurrent episode ([place])    On Lexapro Feels well now, wants to discontinue Lexapro now, advised taper and discontinue.       Other Visit Diagnoses    Encounter to establish care    -  Primary Care established Previous chart reviewed History and medications reviewed with the patient   Relevant Orders   CBC   CMP14+EGFR   Hemoglobin A1c   Lipid panel   Hyperlipidemia, unspecified hyperlipidemia type       Relevant Orders   Lipid panel      Outpatient Encounter Medications as of 01/01/2021  Medication Sig  . amLODipine (NORVASC) 10 MG tablet TAKE 1 TABLET BY MOUTH DAILY  . Blood Glucose Monitoring Suppl (BLOOD GLUCOSE SYSTEM PAK) KIT Please dispense based on patient and insurance preference. Use as directed to monitor FSBS 1x daily. Dx: E11.9  . escitalopram (LEXAPRO) 10 MG tablet Take 1 tablet (10 mg total) by mouth at bedtime.  Marland Kitchen FARXIGA 10 MG TABS tablet TAKE 1 TABLET BY MOUTH EVERY DAY  . Glucose Blood (BLOOD GLUCOSE TEST STRIPS) STRP Please dispense based on patient and insurance preference. Use as directed to monitor FSBS 1x daily. Dx: E11.9  . Lancet Devices MISC Please dispense based on patient and insurance preference. Use as directed to monitor FSBS 1x daily. Dx: E11.9  . lisinopril-hydrochlorothiazide (ZESTORETIC) 10-12.5 MG tablet TAKE 1 TABLET BT MOUTH DAILY  . metFORMIN (GLUCOPHAGE) 500 MG tablet  Take 2 tablets (1,000 mg total) by mouth 2 (two) times daily with a meal.  . Multiple Vitamin (MULTIVITAMIN WITH MINERALS) TABS tablet Take 1 tablet by mouth 4 (four) times a week.  Marland Kitchen omeprazole (PRILOSEC) 40 MG capsule TAKE 1 CAPSULE BY MOUTH EVERY DAY  . potassium chloride (KLOR-CON) 10 MEQ tablet TAKE 1 TABLET(10 MEQ) BY MOUTH DAILY  . pravastatin (PRAVACHOL) 20 MG tablet Take 1 tablet (20 mg total) by mouth at bedtime.   No facility-administered encounter medications on file as of 01/01/2021.    Follow-up: Return in about 10 weeks (around 03/12/2021).   Lindell Spar, MD

## 2021-01-01 NOTE — Patient Instructions (Signed)
Please take Lexapro 5 mg for 2 weeks and discontinue it after that.  Please continue to take other medications as prescribed.  Please follow DASH diet and perform moderate exercise/walking at least 150 mins/week.  Please bring the blood glucose log in the next visit.  Please get fasting blood tests done before the next visit.

## 2021-01-01 NOTE — Assessment & Plan Note (Signed)
BP Readings from Last 1 Encounters:  01/01/21 108/75   Well-controlled with Amlodipine and Zestoretic If BP stable in next visit, DC HCTZ and potassium supplement Counseled for compliance with the medications Advised DASH diet and moderate exercise/walking, at least 150 mins/week

## 2021-01-01 NOTE — Assessment & Plan Note (Signed)
On Lexapro Feels well now, wants to discontinue Lexapro now, advised taper and discontinue.

## 2021-01-01 NOTE — Assessment & Plan Note (Signed)
Smokes 0.5 pack/day ? ?Asked about quitting: confirms that she currently smokes cigarettes ?Advise to quit smoking: Educated about QUITTING to reduce the risk of cancer, cardio and cerebrovascular disease. ?Assess willingness: Unwilling to quit at this time, but is working on cutting back. ?Assist with counseling and pharmacotherapy: Counseled for 5 minutes and literature provided. ?Arrange for follow up: Follow up in 3 months and continue to offer help. ?

## 2021-01-01 NOTE — Assessment & Plan Note (Signed)
HbA1C: 10.6 On Metformin and Farxiga Advised to follow diabetic diet On statin (plan to change it to Crestor after checking lipid profile) On ACEi F/u CMP and lipid panel Diabetic eye exam: Advised to follow up with Ophthalmology for diabetic eye exam

## 2021-01-20 ENCOUNTER — Other Ambulatory Visit: Payer: Self-pay | Admitting: Family Medicine

## 2021-01-20 DIAGNOSIS — E7800 Pure hypercholesterolemia, unspecified: Secondary | ICD-10-CM

## 2021-01-25 ENCOUNTER — Telehealth: Payer: Self-pay | Admitting: Orthopedic Surgery

## 2021-01-25 MED ORDER — CLINDAMYCIN HCL 150 MG PO CAPS: 600.0000 mg | ORAL_CAPSULE | Freq: Once | ORAL | 2 refills | Status: AC

## 2021-01-25 NOTE — Telephone Encounter (Signed)
Patient called to relay she has an upcoming dental appointment Thursday, 01/31/21, and is inquiring about pre-antibiotics. States she uses [facility] in [place]. Patient Cell Ph [phone]

## 2021-01-25 NOTE — Telephone Encounter (Signed)
Sent per dental protocol

## 2021-01-27 ENCOUNTER — Other Ambulatory Visit: Payer: Self-pay | Admitting: Family Medicine

## 2021-02-02 ENCOUNTER — Other Ambulatory Visit: Payer: Self-pay | Admitting: Family Medicine

## 2021-02-10 ENCOUNTER — Other Ambulatory Visit: Payer: Self-pay | Admitting: Family Medicine

## 2021-02-19 ENCOUNTER — Encounter: Payer: Self-pay | Admitting: Internal Medicine

## 2021-02-19 ENCOUNTER — Ambulatory Visit: Payer: BC Managed Care – PPO | Admitting: Internal Medicine

## 2021-02-19 ENCOUNTER — Other Ambulatory Visit: Payer: Self-pay

## 2021-02-19 VITALS — BP 132/88 | HR 77 | Temp 99.0°F | Resp 18 | Ht 61.0 in | Wt 154.0 lb

## 2021-02-19 DIAGNOSIS — B351 Tinea unguium: Secondary | ICD-10-CM | POA: Insufficient documentation

## 2021-02-19 DIAGNOSIS — E119 Type 2 diabetes mellitus without complications: Secondary | ICD-10-CM

## 2021-02-19 DIAGNOSIS — I10 Essential (primary) hypertension: Secondary | ICD-10-CM | POA: Diagnosis not present

## 2021-02-19 DIAGNOSIS — L299 Pruritus, unspecified: Secondary | ICD-10-CM

## 2021-02-19 LAB — POCT GLYCOSYLATED HEMOGLOBIN (HGB A1C): HbA1c, POC (controlled diabetic range): 6.5 % (ref 0.0–7.0)

## 2021-02-19 MED ORDER — TERBINAFINE HCL 250 MG PO TABS: 250.0000 mg | ORAL_TABLET | Freq: Every day | ORAL | 2 refills | Status: DC

## 2021-02-19 MED ORDER — HYDROXYZINE HCL 10 MG PO TABS: 10.0000 mg | ORAL_TABLET | Freq: Three times a day (TID) | ORAL | 0 refills | Status: AC | PRN

## 2021-02-19 MED ORDER — DAPAGLIFLOZIN PROPANEDIOL 10 MG PO TABS: 10.0000 mg | ORAL_TABLET | Freq: Every day | ORAL | 2 refills | Status: DC

## 2021-02-19 MED ORDER — METFORMIN HCL ER 500 MG PO TB24: 500.0000 mg | ORAL_TABLET | Freq: Every day | ORAL | 2 refills | Status: DC

## 2021-02-19 NOTE — Patient Instructions (Signed)
Please start taking Metformin-XR 500 mg instead of regular Metformin.  Please continue taking other medications.  Please take Terbinafine for nail fungus infection. Please keep the area clean and dry.  Please get fasting blood tests done before the next visit.

## 2021-02-19 NOTE — Assessment & Plan Note (Signed)
HbA1C: 6.5 On Metformin and Farxiga Advised to follow diabetic diet On statin and ACEi F/u CMP and lipid panel Diabetic eye exam: Advised to follow up with Ophthalmology for diabetic eye exam

## 2021-02-19 NOTE — Assessment & Plan Note (Signed)
BP Readings from Last 1 Encounters:  02/19/21 132/88   Well-controlled with Amlodipine and Zestoretic Counseled for compliance with the medications Advised DASH diet and moderate exercise/walking, at least 150 mins/week

## 2021-02-19 NOTE — Progress Notes (Signed)
Established Patient Office Visit  Subjective:  Patient ID: Danielle Hicks, female    DOB: [birth year]  Age: 65 y.o. MRN: [id number]  CC:  Chief Complaint  Patient presents with  . Follow-up    10 week follow up has been having diarrhea everytime she eats for about a month also hands feet and head have been itching     HPI Danielle Hicks is a 65 year old female with PMH of HTN, type 2 DM, GERD and tobacco abuse who presents for follow up of her chronic medical conditions.  DM: Her HbA1C was 6.5 today. She has been taking Metformin and Farxiga regularly. She checks her blood glucose regularly. Blood glucose average has been as follows: 14-day: 124 30-day: 126 Denies any polyuria or polydipsia.  She has been having diarrhea for last 1 month after each meal. Denies nausea, vomiting, melena or hematochezia.  HTN: BP is well-controlled. Takes medications regularly. Patient denies headache, dizziness, chest pain, dyspnea or palpitations.  She also has been having intermittent, generalized itching. She has been using steroid cream for itching with mild relief.  She also shows that her left foot first toenail has been yellowish and thick. She has been having itching around the toenail.   Past Medical History:  Diagnosis Date  . Arthritis   . GERD (gastroesophageal reflux disease)   . Headache   . Hypertension   . Type 2 diabetes mellitus ([place])     Past Surgical History:  Procedure Laterality Date  . ABDOMINAL HYSTERECTOMY    . BLEPHAROPLASTY Bilateral   . COLONOSCOPY N/A 06/17/2018   Procedure: COLONOSCOPY;  Surgeon: Rogene Houston, MD;  [facility];  Service: Endoscopy;  Laterality: N/A;  12:25  . HEMORRHOID SURGERY    . POLYPECTOMY  06/17/2018   Procedure: POLYPECTOMY;  Surgeon: Rogene Houston, MD;  [facility];  Service: Endoscopy;;  colon  . TOTAL KNEE ARTHROPLASTY Left 01/26/2018   Procedure: TOTAL KNEE ARTHROPLASTY;  Surgeon: Carole Civil, MD;  Location: [facility];  Service: Orthopedics;  Laterality: Left;    Family History  Problem Relation Age of Onset  . Diabetes Mother   . Hypertension Mother   . Stroke Sister     Social History   Socioeconomic History  . Marital status: Married    Spouse name: Not on file  . Number of children: Not on file  . Years of education: Not on file  . Highest education level: Not on file  Occupational History  . Not on file  Tobacco Use  . Smoking status: Current Some Day Smoker    Types: Cigarettes  . Smokeless tobacco: Never Used  . Tobacco comment: trying to quit   Vaping Use  . Vaping Use: Never used  Substance and Sexual Activity  . Alcohol use: No    Alcohol/week: 0.0 standard drinks  . Drug use: No  . Sexual activity: Yes  Other Topics Concern  . Not on file  Social History Narrative  . Not on file   Social Determinants of Health   Financial Resource Strain: Not on file  Food Insecurity: Not on file  Transportation Needs: Not on file  Physical Activity: Not on file  Stress: Not on file  Social Connections: Not on file  Intimate Partner Violence: Not on file    Outpatient Medications Prior to Visit  Medication Sig Dispense Refill  . amLODipine (NORVASC) 10 MG tablet TAKE 1 TABLET BY MOUTH DAILY 90 tablet 1  .  Blood Glucose Monitoring Suppl (BLOOD GLUCOSE SYSTEM PAK) KIT Please dispense based on patient and insurance preference. Use as directed to monitor FSBS 1x daily. Dx: E11.9 1 each 1  . escitalopram (LEXAPRO) 10 MG tablet TAKE 1 TABLET BY MOUTH EVERYDAY AT BEDTIME 30 tablet 0  . Glucose Blood (BLOOD GLUCOSE TEST STRIPS) STRP Please dispense based on patient and insurance preference. Use as directed to monitor FSBS 1x daily. Dx: E11.9 100 each 1  . Lancet Devices MISC Please dispense based on patient and insurance preference. Use as directed to monitor FSBS 1x daily. Dx: E11.9 100 each 1  . lisinopril-hydrochlorothiazide (ZESTORETIC) 10-12.5 MG  tablet TAKE 1 TABLET BT MOUTH DAILY 90 tablet 3  . Multiple Vitamin (MULTIVITAMIN WITH MINERALS) TABS tablet Take 1 tablet by mouth 4 (four) times a week.    Marland Kitchen omeprazole (PRILOSEC) 40 MG capsule TAKE 1 CAPSULE BY MOUTH EVERY DAY 30 capsule 0  . potassium chloride (KLOR-CON) 10 MEQ tablet TAKE 1 TABLET(10 MEQ) BY MOUTH DAILY 90 tablet 3  . pravastatin (PRAVACHOL) 20 MG tablet TAKE 1 TABLET BY MOUTH EVERYDAY AT BEDTIME 90 tablet 0  . FARXIGA 10 MG TABS tablet TAKE 1 TABLET BY MOUTH EVERY DAY 30 tablet 1  . metFORMIN (GLUCOPHAGE) 500 MG tablet Take 2 tablets (1,000 mg total) by mouth 2 (two) times daily with a meal. 180 tablet 3   No facility-administered medications prior to visit.    Allergies  Allergen Reactions  . Penicillins Rash     Has patient had a PCN reaction causing immediate rash, facial/tongue/throat swelling, SOB or lightheadedness with hypotension: Yes Has patient had a PCN reaction causing severe rash involving mucus membranes or skin necrosis: No Has patient had a PCN reaction that required hospitalization: No Has patient had a PCN reaction occurring within the last 10 years: No If all of the above answers are "NO", then may proceed with Cephalosporin use.     ROS Review of Systems  Constitutional: Negative for chills and fever.  HENT: Negative for congestion, sinus pressure, sinus pain and sore throat.   Eyes: Negative for pain and discharge.  Respiratory: Negative for cough and shortness of breath.   Cardiovascular: Negative for chest pain and palpitations.  Gastrointestinal: Positive for diarrhea. Negative for abdominal pain, constipation, nausea and vomiting.  Endocrine: Negative for polydipsia and polyuria.  Genitourinary: Negative for dysuria and hematuria.  Musculoskeletal: Negative for neck pain and neck stiffness.  Skin: Negative for rash.  Neurological: Negative for dizziness and weakness.  Psychiatric/Behavioral: Negative for agitation and behavioral  problems.      Objective:    Physical Exam Vitals reviewed.  Constitutional:      General: She is not in acute distress.    Appearance: She is not diaphoretic.  HENT:     Head: Normocephalic and atraumatic.     Nose: Nose normal.     Mouth/Throat:     Mouth: Mucous membranes are moist.  Eyes:     General: No scleral icterus.    Extraocular Movements: Extraocular movements intact.     Pupils: Pupils are equal, round, and reactive to light.  Cardiovascular:     Rate and Rhythm: Normal rate and regular rhythm.     Pulses: Normal pulses.     Heart sounds: Normal heart sounds. No murmur heard.   Pulmonary:     Breath sounds: Normal breath sounds. No wheezing or rales.  Abdominal:     Palpations: Abdomen is soft.  Tenderness: There is no abdominal tenderness.  Musculoskeletal:     Cervical back: Neck supple. No tenderness.     Right lower leg: No edema.     Left lower leg: No edema.  Skin:    General: Skin is warm.     Findings: No rash.     Comments: Onychomycosis of the great toenail of left foot  Neurological:     General: No focal deficit present.     Mental Status: She is alert and oriented to person, place, and time.     Sensory: No sensory deficit.     Motor: No weakness.  Psychiatric:        Mood and Affect: Mood normal.        Behavior: Behavior normal.     BP 132/88 (BP Location: Right Arm, Patient Position: Sitting, Cuff Size: Normal)   Pulse 77   Temp 99 F (37.2 C) (Oral)   Resp 18   Ht 5' 1"  (1.549 m)   Wt 154 lb (69.9 kg)   SpO2 98%   BMI 29.10 kg/m  Wt Readings from Last 3 Encounters:  02/19/21 154 lb (69.9 kg)  01/01/21 154 lb (69.9 kg)  10/29/20 158 lb (71.7 kg)     There are no preventive care reminders to display for this patient.  There are no preventive care reminders to display for this patient.  Lab Results  Component Value Date   TSH 1.06 01/14/2019   Lab Results  Component Value Date   WBC 8.5 07/20/2020   HGB 12.6  07/20/2020   HCT 39.6 07/20/2020   MCV 82.7 07/20/2020   PLT 386 07/20/2020   Lab Results  Component Value Date   NA 139 10/29/2020   K 3.8 10/29/2020   CO2 29 10/29/2020   GLUCOSE 200 (H) 10/29/2020   BUN 8 10/29/2020   CREATININE 0.78 10/29/2020   BILITOT 0.5 07/20/2020   ALKPHOS 72 09/18/2018   AST 8 (L) 07/20/2020   ALT 6 07/20/2020   PROT 6.9 07/20/2020   ALBUMIN 3.9 09/18/2018   CALCIUM 9.5 10/29/2020   ANIONGAP 8 09/18/2018   Lab Results  Component Value Date   CHOL 223 (H) 10/29/2020   Lab Results  Component Value Date   HDL 50 10/29/2020   Lab Results  Component Value Date   LDLCALC 144 (H) 10/29/2020   Lab Results  Component Value Date   TRIG 155 (H) 10/29/2020   Lab Results  Component Value Date   CHOLHDL 4.5 10/29/2020   Lab Results  Component Value Date   HGBA1C 6.5 02/19/2021      Assessment & Plan:   Problem List Items Addressed This Visit      Cardiovascular and Mediastinum   Essential hypertension, benign    BP Readings from Last 1 Encounters:  02/19/21 132/88   Well-controlled with Amlodipine and Zestoretic Counseled for compliance with the medications Advised DASH diet and moderate exercise/walking, at least 150 mins/week       Relevant Orders   CBC   TSH     Endocrine   DM (diabetes mellitus) ([place]) - Primary    HbA1C: 6.5 On Metformin and Farxiga Advised to follow diabetic diet On statin and ACEi F/u CMP and lipid panel Diabetic eye exam: Advised to follow up with Ophthalmology for diabetic eye exam       Relevant Medications   metFORMIN (GLUCOPHAGE-XR) 500 MG 24 hr tablet   dapagliflozin propanediol (FARXIGA) 10 MG TABS tablet  Other Relevant Orders   POCT glycosylated hemoglobin (Hb A1C) (Completed)   CBC   CMP14+EGFR   Lipid panel   HgB A1c     Musculoskeletal and Integument   Onychomycosis    Started Terbinafine for toenail onychomycosis      Relevant Medications   terbinafine (LAMISIL) 250 MG  tablet    Other Visit Diagnoses    Itching       Relevant Medications   hydrOXYzine (ATARAX/VISTARIL) 10 MG tablet   Controlled type 2 diabetes mellitus without complication, without long-term current use of insulin ([facility])       Relevant Medications   metFORMIN (GLUCOPHAGE-XR) 500 MG 24 hr tablet   dapagliflozin propanediol (FARXIGA) 10 MG TABS tablet      Meds ordered this encounter  Medications  . metFORMIN (GLUCOPHAGE-XR) 500 MG 24 hr tablet    Sig: Take 1 tablet (500 mg total) by mouth daily with breakfast.    Dispense:  30 tablet    Refill:  2  . terbinafine (LAMISIL) 250 MG tablet    Sig: Take 1 tablet (250 mg total) by mouth daily.    Dispense:  28 tablet    Refill:  2  . dapagliflozin propanediol (FARXIGA) 10 MG TABS tablet    Sig: Take 1 tablet (10 mg total) by mouth daily.    Dispense:  30 tablet    Refill:  2  . hydrOXYzine (ATARAX/VISTARIL) 10 MG tablet    Sig: Take 1 tablet (10 mg total) by mouth 3 (three) times daily as needed.    Dispense:  30 tablet    Refill:  0    Follow-up: Return in about 4 months (around 06/21/2021) for Annual physical.    Lindell Spar, MD

## 2021-02-19 NOTE — Assessment & Plan Note (Signed)
Started Terbinafine for toenail onychomycosis

## 2021-02-25 ENCOUNTER — Other Ambulatory Visit: Payer: Self-pay | Admitting: Family Medicine

## 2021-03-01 ENCOUNTER — Other Ambulatory Visit: Payer: Self-pay | Admitting: Family Medicine

## 2021-03-14 ENCOUNTER — Ambulatory Visit: Payer: BC Managed Care – PPO | Admitting: Internal Medicine

## 2021-03-14 ENCOUNTER — Other Ambulatory Visit: Payer: Self-pay | Admitting: Family Medicine

## 2021-03-18 ENCOUNTER — Other Ambulatory Visit: Payer: Self-pay | Admitting: Family Medicine

## 2021-03-26 ENCOUNTER — Other Ambulatory Visit: Payer: Self-pay | Admitting: Family Medicine

## 2021-03-28 ENCOUNTER — Other Ambulatory Visit: Payer: Self-pay | Admitting: Family Medicine

## 2021-03-28 ENCOUNTER — Other Ambulatory Visit: Payer: Self-pay | Admitting: Internal Medicine

## 2021-04-15 ENCOUNTER — Ambulatory Visit: Payer: BC Managed Care – PPO

## 2021-04-15 ENCOUNTER — Other Ambulatory Visit: Payer: Self-pay

## 2021-04-15 ENCOUNTER — Ambulatory Visit: Payer: BC Managed Care – PPO | Admitting: Orthopedic Surgery

## 2021-04-15 VITALS — BP 144/88 | HR 78 | Ht 61.0 in | Wt 155.0 lb

## 2021-04-15 DIAGNOSIS — M25561 Pain in right knee: Secondary | ICD-10-CM

## 2021-04-15 DIAGNOSIS — M1710 Unilateral primary osteoarthritis, unspecified knee: Secondary | ICD-10-CM

## 2021-04-15 DIAGNOSIS — G8929 Other chronic pain: Secondary | ICD-10-CM

## 2021-04-15 DIAGNOSIS — Z96652 Presence of left artificial knee joint: Secondary | ICD-10-CM | POA: Diagnosis not present

## 2021-04-15 DIAGNOSIS — M170 Bilateral primary osteoarthritis of knee: Secondary | ICD-10-CM | POA: Diagnosis not present

## 2021-04-15 NOTE — Progress Notes (Signed)
Annual follow up visit   Chief Complaint  Patient presents with  . Post-op Follow-up    Left total knee replaced 01/26/18  . Knee Pain    Right/ wants injection    Encounter Diagnoses  Name Primary?  Marland Kitchen Arthritis of knee Yes  . Chronic pain of right knee   . S/P total knee replacement, left 01/26/18    Left knee 3-year follow-up total knee  Right knee osteoarthritis x-ray shows severe medial disease with moderate varus  66 year old female comes in for the 3-year follow-up on the [employer] fixed-bearing PS knee with no complaints  However the right knee is painful on the medial side and is bothering her.  She says she went to [facility] did a lot of walking that she does not normally do and she also had a tripping episode where she twisted the right knee.  Prior to that she was not having any problems  She is asking if an injection may help  Examination of the right knee focused exam shows tenderness over the medial joint line but no evidence of effusion her skin is normal her knee range of motion is 120 degrees is a slight flexion contracture but is less than 5degrees, there is no instability and the muscle tone is normal  The left knee has a well-healed incision has good flexion extension good stable firm endpoints on posterior anterior drawer collateral ligaments are stable no effusion good muscle strength and muscle tone  I think an injection will help the right knee temporarily however I told her she will eventually need knee replacement surgery based on that x-ray but there is no rush   A steroid injection was performed at right knee using 1% plain Lidocaine and 40 mg of Depo-Medrol. This was well tolerated.

## 2021-04-15 NOTE — Patient Instructions (Signed)

## 2021-05-17 ENCOUNTER — Other Ambulatory Visit: Payer: Self-pay | Admitting: Internal Medicine

## 2021-05-17 DIAGNOSIS — E119 Type 2 diabetes mellitus without complications: Secondary | ICD-10-CM

## 2021-05-26 ENCOUNTER — Other Ambulatory Visit: Payer: Self-pay | Admitting: Internal Medicine

## 2021-05-26 DIAGNOSIS — E119 Type 2 diabetes mellitus without complications: Secondary | ICD-10-CM

## 2021-06-05 ENCOUNTER — Telehealth: Payer: Self-pay | Admitting: Orthopedic Surgery

## 2021-06-05 MED ORDER — CLINDAMYCIN HCL 150 MG PO CAPS: 600.0000 mg | ORAL_CAPSULE | Freq: Once | ORAL | 2 refills | Status: AC

## 2021-06-05 NOTE — Telephone Encounter (Signed)
Patient called to request pre-medication for dental cleaning appointment tomorrow, 06/06/21. Please advise.

## 2021-06-10 ENCOUNTER — Other Ambulatory Visit: Payer: Self-pay | Admitting: Family Medicine

## 2021-06-10 DIAGNOSIS — E7800 Pure hypercholesterolemia, unspecified: Secondary | ICD-10-CM

## 2021-06-11 NOTE — Telephone Encounter (Signed)
Patient has transferred care to your office.  

## 2021-06-18 ENCOUNTER — Other Ambulatory Visit: Payer: Self-pay | Admitting: *Deleted

## 2021-06-18 DIAGNOSIS — E119 Type 2 diabetes mellitus without complications: Secondary | ICD-10-CM

## 2021-06-18 MED ORDER — AMLODIPINE BESYLATE 10 MG PO TABS: ORAL_TABLET | ORAL | 1 refills | Status: DC

## 2021-06-19 ENCOUNTER — Other Ambulatory Visit: Payer: Self-pay

## 2021-06-20 LAB — CBC
Hematocrit: 38 % (ref 34.0–46.6)
Hemoglobin: 12.8 g/dL (ref 11.1–15.9)
MCH: 27.4 pg (ref 26.6–33.0)
MCHC: 33.7 g/dL (ref 31.5–35.7)
MCV: 81 fL (ref 79–97)
Platelets: 376 10*3/uL (ref 150–450)
RBC: 4.68 x10E6/uL (ref 3.77–5.28)
RDW: 14.7 % (ref 11.7–15.4)
WBC: 7.6 10*3/uL (ref 3.4–10.8)

## 2021-06-20 LAB — TSH: TSH: 1.28 u[IU]/mL (ref 0.450–4.500)

## 2021-06-20 LAB — LIPID PANEL
Chol/HDL Ratio: 2.9 ratio (ref 0.0–4.4)
Cholesterol, Total: 189 mg/dL (ref 100–199)
HDL: 66 mg/dL (ref >39)
LDL Chol Calc (NIH): 110 mg/dL — ABNORMAL HIGH (ref 0–99)
Triglycerides: 73 mg/dL (ref 0–149)
VLDL Cholesterol Cal: 13 mg/dL (ref 5–40)

## 2021-06-20 LAB — CMP14+EGFR
ALT: 14 IU/L (ref 0–32)
AST: 12 IU/L (ref 0–40)
Albumin/Globulin Ratio: 1.6 (ref 1.2–2.2)
Albumin: 4.1 g/dL (ref 3.8–4.8)
Alkaline Phosphatase: 79 IU/L (ref 44–121)
BUN/Creatinine Ratio: 11 — ABNORMAL LOW (ref 12–28)
BUN: 10 mg/dL (ref 8–27)
Bilirubin Total: 0.4 mg/dL (ref 0.0–1.2)
CO2: 24 mmol/L (ref 20–29)
Calcium: 9.4 mg/dL (ref 8.7–10.3)
Chloride: 103 mmol/L (ref 96–106)
Creatinine, Ser: 0.88 mg/dL (ref 0.57–1.00)
Globulin, Total: 2.5 g/dL (ref 1.5–4.5)
Glucose: 120 mg/dL — ABNORMAL HIGH (ref 65–99)
Potassium: 4.1 mmol/L (ref 3.5–5.2)
Sodium: 140 mmol/L (ref 134–144)
Total Protein: 6.6 g/dL (ref 6.0–8.5)
eGFR: 74 mL/min/{1.73_m2} (ref >59)

## 2021-06-20 LAB — HEMOGLOBIN A1C
Est. average glucose Bld gHb Est-mCnc: 148 mg/dL
Hgb A1c MFr Bld: 6.8 % — ABNORMAL HIGH (ref 4.8–5.6)

## 2021-06-25 ENCOUNTER — Encounter: Payer: Self-pay | Admitting: Internal Medicine

## 2021-06-25 ENCOUNTER — Ambulatory Visit (INDEPENDENT_AMBULATORY_CARE_PROVIDER_SITE_OTHER): Payer: BC Managed Care – PPO | Admitting: Internal Medicine

## 2021-06-25 ENCOUNTER — Other Ambulatory Visit: Payer: Self-pay

## 2021-06-25 VITALS — BP 132/89 | HR 64 | Temp 98.8°F | Resp 16 | Ht 61.0 in | Wt 162.0 lb

## 2021-06-25 DIAGNOSIS — I10 Essential (primary) hypertension: Secondary | ICD-10-CM

## 2021-06-25 DIAGNOSIS — Z0000 Encounter for general adult medical examination without abnormal findings: Secondary | ICD-10-CM

## 2021-06-25 DIAGNOSIS — E119 Type 2 diabetes mellitus without complications: Secondary | ICD-10-CM | POA: Diagnosis not present

## 2021-06-25 DIAGNOSIS — Z23 Encounter for immunization: Secondary | ICD-10-CM

## 2021-06-25 NOTE — Assessment & Plan Note (Signed)
Lab Results  Component Value Date   HGBA1C 6.8 (H) 06/19/2021   On Metformin and Farxiga Advised to follow diabetic diet On statin and ACEi Diabetic eye exam: Advised to follow up with Ophthalmology for diabetic eye exam

## 2021-06-25 NOTE — Progress Notes (Signed)
Established Patient Office Visit  Subjective:  Patient ID: Danielle Hicks, female    DOB: Oct 08, 1956  Age: 65 y.o. MRN: [id number]  CC:  Chief Complaint  Patient presents with   Annual Exam    Annual exam     HPI Danielle Hicks  is a 65 year old female with PMH of HTN, type 2 DM, GERD and tobacco abuse who presents for annual physical.  She has been doing well overall.  DM: Her HbA1C was 6.8. She has been taking Metformin and Farxiga regularly. She checks her blood glucose regularly. She states that she needs to improve her diet as she was having pancakes and soda lately.  HTN: BP is well-controlled. Takes medications regularly. Patient denies headache, dizziness, chest pain, dyspnea or palpitations.  She received PCV20 in the office today.  Past Medical History:  Diagnosis Date   Arthritis    GERD (gastroesophageal reflux disease)    Headache    Hypertension    Type 2 diabetes mellitus ([place])     Past Surgical History:  Procedure Laterality Date   ABDOMINAL HYSTERECTOMY     BLEPHAROPLASTY Bilateral    COLONOSCOPY N/A 06/17/2018   Procedure: COLONOSCOPY;  Surgeon: Rogene Houston, MD;  [facility];  Service: Endoscopy;  Laterality: N/A;  12:25   HEMORRHOID SURGERY     POLYPECTOMY  06/17/2018   Procedure: POLYPECTOMY;  Surgeon: Rogene Houston, MD;  Location: [facility];  Service: Endoscopy;;  colon   TOTAL KNEE ARTHROPLASTY Left 01/26/2018   Procedure: TOTAL KNEE ARTHROPLASTY;  Surgeon: Carole Civil, MD;  Location: [facility];  Service: Orthopedics;  Laterality: Left;    Family History  Problem Relation Age of Onset   Diabetes Mother    Hypertension Mother    Stroke Sister     Social History   Socioeconomic History   Marital status: Married    Spouse name: Not on file   Number of children: Not on file   Years of education: Not on file   Highest education level: Not on file  Occupational History   Not on file  Tobacco Use   Smoking  status: Some Days    Types: Cigarettes   Smokeless tobacco: Never   Tobacco comments:    trying to quit   Vaping Use   Vaping Use: Never used  Substance and Sexual Activity   Alcohol use: No    Alcohol/week: 0.0 standard drinks   Drug use: No   Sexual activity: Yes  Other Topics Concern   Not on file  Social History Narrative   Not on file   Social Determinants of Health   Financial Resource Strain: Not on file  Food Insecurity: Not on file  Transportation Needs: Not on file  Physical Activity: Not on file  Stress: Not on file  Social Connections: Not on file  Intimate Partner Violence: Not on file    Outpatient Medications Prior to Visit  Medication Sig Dispense Refill   amLODipine (NORVASC) 10 MG tablet TAKE 1 TABLET BY MOUTH DAILY 90 tablet 1   Blood Glucose Monitoring Suppl (BLOOD GLUCOSE SYSTEM PAK) KIT Please dispense based on patient and insurance preference. Use as directed to monitor FSBS 1x daily. Dx: E11.9 1 each 1   FARXIGA 10 MG TABS tablet TAKE 1 TABLET BY MOUTH EVERY DAY 30 tablet 2   Glucose Blood (BLOOD GLUCOSE TEST STRIPS) STRP Please dispense based on patient and insurance preference. Use as directed to monitor FSBS  1x daily. Dx: E11.9 100 each 1   hydrOXYzine (ATARAX/VISTARIL) 10 MG tablet Take 1 tablet (10 mg total) by mouth 3 (three) times daily as needed. 30 tablet 0   Lancet Devices MISC Please dispense based on patient and insurance preference. Use as directed to monitor FSBS 1x daily. Dx: E11.9 100 each 1   lisinopril-hydrochlorothiazide (ZESTORETIC) 10-12.5 MG tablet TAKE 1 TABLET BT MOUTH DAILY 90 tablet 3   metFORMIN (GLUCOPHAGE-XR) 500 MG 24 hr tablet TAKE 1 TABLET BY MOUTH EVERY DAY WITH BREAKFAST 90 tablet 0   Multiple Vitamin (MULTIVITAMIN WITH MINERALS) TABS tablet Take 1 tablet by mouth 4 (four) times a week.     omeprazole (PRILOSEC) 40 MG capsule TAKE 1 CAPSULE BY MOUTH EVERY DAY 30 capsule 0   potassium chloride (KLOR-CON) 10 MEQ tablet  TAKE 1 TABLET(10 MEQ) BY MOUTH DAILY 90 tablet 3   pravastatin (PRAVACHOL) 20 MG tablet TAKE 1 TABLET BY MOUTH EVERYDAY AT BEDTIME 90 tablet 0   escitalopram (LEXAPRO) 10 MG tablet TAKE 1 TABLET BY MOUTH EVERYDAY AT BEDTIME 30 tablet 0   terbinafine (LAMISIL) 250 MG tablet Take 1 tablet (250 mg total) by mouth daily. 28 tablet 2   No facility-administered medications prior to visit.    Allergies  Allergen Reactions   Penicillins Rash     Has patient had a PCN reaction causing immediate rash, facial/tongue/throat swelling, SOB or lightheadedness with hypotension: Yes Has patient had a PCN reaction causing severe rash involving mucus membranes or skin necrosis: No Has patient had a PCN reaction that required hospitalization: No Has patient had a PCN reaction occurring within the last 10 years: No If all of the above answers are "NO", then may proceed with Cephalosporin use.     ROS Review of Systems  Constitutional:  Negative for chills and fever.  HENT:  Negative for congestion, sinus pressure, sinus pain and sore throat.   Eyes:  Negative for pain and discharge.  Respiratory:  Negative for cough and shortness of breath.   Cardiovascular:  Negative for chest pain and palpitations.  Gastrointestinal:  Negative for abdominal pain, constipation, nausea and vomiting.  Endocrine: Negative for polydipsia and polyuria.  Genitourinary:  Negative for dysuria and hematuria.  Musculoskeletal:  Positive for arthralgias. Negative for neck pain and neck stiffness.  Skin:  Negative for rash.  Neurological:  Negative for dizziness and weakness.  Psychiatric/Behavioral:  Negative for agitation and behavioral problems.      Objective:    Physical Exam Vitals reviewed.  Constitutional:      General: She is not in acute distress.    Appearance: She is not diaphoretic.  HENT:     Head: Normocephalic and atraumatic.     Nose: Nose normal.     Mouth/Throat:     Mouth: Mucous membranes are  moist.  Eyes:     General: No scleral icterus.    Extraocular Movements: Extraocular movements intact.  Neck:     Vascular: No carotid bruit.  Cardiovascular:     Rate and Rhythm: Normal rate and regular rhythm.     Pulses: Normal pulses.     Heart sounds: Normal heart sounds. No murmur heard. Pulmonary:     Breath sounds: Normal breath sounds. No wheezing or rales.  Abdominal:     Palpations: Abdomen is soft.     Tenderness: There is no abdominal tenderness.  Musculoskeletal:     Cervical back: Neck supple. No tenderness.     Right lower leg:  No edema.     Left lower leg: No edema.  Skin:    General: Skin is warm.     Findings: No rash.  Neurological:     General: No focal deficit present.     Mental Status: She is alert and oriented to person, place, and time.     Cranial Nerves: No cranial nerve deficit.     Sensory: No sensory deficit.     Motor: No weakness.  Psychiatric:        Mood and Affect: Mood normal.        Behavior: Behavior normal.    BP 132/89 (BP Location: Left Arm, Patient Position: Sitting, Cuff Size: Normal)   Pulse 64   Temp 98.8 F (37.1 C) (Oral)   Resp 16   Ht _0  (1.549 m)   Wt 162 lb (73.5 kg)   SpO2 97%   BMI 30.61 kg/m  Wt Readings from Last 3 Encounters:  06/25/21 162 lb (73.5 kg)  04/15/21 155 lb (70.3 kg)  02/19/21 154 lb (69.9 kg)     Health Maintenance Due  Topic Date Due   Zoster Vaccines- Shingrix (1 of 2) Never done   Pneumococcal Vaccine 39-83 Years old (2 - PCV) 12/12/2014   COVID-19 Vaccine (4 - Booster for Pfizer series) 02/23/2021   INFLUENZA VACCINE  06/10/2021    There are no preventive care reminders to display for this patient.  Lab Results  Component Value Date   TSH 1.280 06/19/2021   Lab Results  Component Value Date   WBC 7.6 06/19/2021   HGB 12.8 06/19/2021   HCT 38.0 06/19/2021   MCV 81 06/19/2021   PLT 376 06/19/2021   Lab Results  Component Value Date   NA 140 06/19/2021   K 4.1 06/19/2021    CO2 24 06/19/2021   GLUCOSE 120 (H) 06/19/2021   BUN 10 06/19/2021   CREATININE 0.88 06/19/2021   BILITOT 0.4 06/19/2021   ALKPHOS 79 06/19/2021   AST 12 06/19/2021   ALT 14 06/19/2021   PROT 6.6 06/19/2021   ALBUMIN 4.1 06/19/2021   CALCIUM 9.4 06/19/2021   ANIONGAP 8 09/18/2018   EGFR 74 06/19/2021   Lab Results  Component Value Date   CHOL 189 06/19/2021   Lab Results  Component Value Date   HDL 66 06/19/2021   Lab Results  Component Value Date   LDLCALC 110 (H) 06/19/2021   Lab Results  Component Value Date   TRIG 73 06/19/2021   Lab Results  Component Value Date   CHOLHDL 2.9 06/19/2021   Lab Results  Component Value Date   HGBA1C 6.8 (H) 06/19/2021      Assessment & Plan:   Problem List Items Addressed This Visit       Annual physical exam - Primary   Annual exam as documented. Counseling done  re healthy lifestyle involving commitment to 150 minutes exercise per week, heart healthy diet, and attaining healthy weight.The importance of adequate sleep also discussed. Changes in health habits are decided on by the patient with goals and time frames  set for achieving them. Immunization and cancer screening needs are specifically addressed at this visit.       Cardiovascular and Mediastinum   Essential hypertension, benign     BP Readings from Last 1 Encounters:  06/25/21 132/89  Well-controlled with Amlodipine and Zestoretic Counseled for compliance with the medications Advised DASH diet and moderate exercise/walking, at least 150 mins/week  Endocrine   DM (diabetes mellitus) ([place])    Lab Results  Component Value Date   HGBA1C 6.8 (H) 06/19/2021  On Metformin and Farxiga Advised to follow diabetic diet On statin and ACEi Diabetic eye exam: Advised to follow up with Ophthalmology for diabetic eye exam             Other Visit Diagnoses     Need for viral immunization       Relevant Orders   Pneumococcal conjugate vaccine  20-valent (Prevnar 20) (Completed)       No orders of the defined types were placed in this encounter.   Follow-up: Return in about 4 months (around 10/25/2021) for HTN and DM.    Lindell Spar, MD

## 2021-06-25 NOTE — Assessment & Plan Note (Signed)
Annual exam as documented. Counseling done  re healthy lifestyle involving commitment to 150 minutes exercise per week, heart healthy diet, and attaining healthy weight.The importance of adequate sleep also discussed. Changes in health habits are decided on by the patient with goals and time frames  set for achieving them. Immunization and cancer screening needs are specifically addressed at this visit. 

## 2021-06-25 NOTE — Assessment & Plan Note (Addendum)
  BP Readings from Last 1 Encounters:  06/25/21 132/89   Well-controlled with Amlodipine and Zestoretic Counseled for compliance with the medications Advised DASH diet and moderate exercise/walking, at least 150 mins/week

## 2021-06-25 NOTE — Patient Instructions (Signed)
Please continue taking medications as prescribed.  Please continue to follow low carb and low salt diet and perform moderate exercise/walking at least 150 mins/week.  We recommend you get Shingrix vaccine at your local pharmacy.

## 2021-07-26 ENCOUNTER — Other Ambulatory Visit: Payer: Self-pay | Admitting: Internal Medicine

## 2021-08-11 ENCOUNTER — Other Ambulatory Visit: Payer: Self-pay | Admitting: Internal Medicine

## 2021-08-11 DIAGNOSIS — E119 Type 2 diabetes mellitus without complications: Secondary | ICD-10-CM

## 2021-08-23 ENCOUNTER — Other Ambulatory Visit: Payer: Self-pay | Admitting: Internal Medicine

## 2021-08-23 DIAGNOSIS — E119 Type 2 diabetes mellitus without complications: Secondary | ICD-10-CM

## 2021-09-07 ENCOUNTER — Other Ambulatory Visit: Payer: Self-pay | Admitting: Internal Medicine

## 2021-09-07 DIAGNOSIS — E7800 Pure hypercholesterolemia, unspecified: Secondary | ICD-10-CM

## 2021-10-25 ENCOUNTER — Ambulatory Visit: Payer: BC Managed Care – PPO | Admitting: Internal Medicine

## 2021-10-25 ENCOUNTER — Encounter: Payer: Self-pay | Admitting: Internal Medicine

## 2021-10-25 ENCOUNTER — Other Ambulatory Visit: Payer: Self-pay

## 2021-10-25 VITALS — BP 118/82 | HR 73 | Resp 18 | Ht 61.0 in | Wt 160.1 lb

## 2021-10-25 DIAGNOSIS — E785 Hyperlipidemia, unspecified: Secondary | ICD-10-CM | POA: Insufficient documentation

## 2021-10-25 DIAGNOSIS — K219 Gastro-esophageal reflux disease without esophagitis: Secondary | ICD-10-CM | POA: Diagnosis not present

## 2021-10-25 DIAGNOSIS — E1169 Type 2 diabetes mellitus with other specified complication: Secondary | ICD-10-CM

## 2021-10-25 DIAGNOSIS — B351 Tinea unguium: Secondary | ICD-10-CM

## 2021-10-25 DIAGNOSIS — I10 Essential (primary) hypertension: Secondary | ICD-10-CM | POA: Diagnosis not present

## 2021-10-25 DIAGNOSIS — Z23 Encounter for immunization: Secondary | ICD-10-CM | POA: Diagnosis not present

## 2021-10-25 DIAGNOSIS — E782 Mixed hyperlipidemia: Secondary | ICD-10-CM

## 2021-10-25 LAB — POCT GLYCOSYLATED HEMOGLOBIN (HGB A1C): HbA1c, POC (controlled diabetic range): 6.6 % (ref 0.0–7.0)

## 2021-10-25 NOTE — Assessment & Plan Note (Signed)
Lab Results  Component Value Date   HGBA1C 6.8 (H) 06/19/2021  With HTN, HLD and GERD On Metformin and Farxiga Advised to follow diabetic diet On statin and ACEi Diabetic eye exam: Advised to follow up with Ophthalmology for diabetic eye exam

## 2021-10-25 NOTE — Assessment & Plan Note (Signed)
°  BP Readings from Last 1 Encounters:  10/25/21 118/82   Well-controlled with Amlodipine and Zestoretic Counseled for compliance with the medications Advised DASH diet and moderate exercise/walking, at least 150 mins/week

## 2021-10-25 NOTE — Assessment & Plan Note (Signed)
Has completed Lamisil PO, slightly better Will continue Lamisil cream for now If persistent, will refer to Podiatry

## 2021-10-25 NOTE — Assessment & Plan Note (Signed)
On omeprazole.  

## 2021-10-25 NOTE — Addendum Note (Signed)
Addended by: Zacarias Pontes R on: 10/25/2021 11:17 AM   Modules accepted: Orders

## 2021-10-25 NOTE — Progress Notes (Signed)
Established Patient Office Visit  Subjective:  Patient ID: Danielle Hicks, female    DOB: Apr 26, 1956  Age: 65 y.o. MRN: [id number]  CC:  Chief Complaint  Patient presents with   Follow-up    4 month follow up     HPI [name] is a 65 y.o. female with past medical history of HTN, type 2 DM, GERD and tobacco abuse who presents for f/u of her chronic medical conditions.  She has been doing well overall.   DM: Her HbA1C was 6.6 in the office today, which is better compared to prior. She has been taking Metformin and Farxiga regularly. She checks her blood glucose regularly. She states that she needs to improve her diet still, but has tried to cut down on pancakes and soda.   HTN: BP is well-controlled. Takes medications regularly. Patient denies headache, dizziness, chest pain, dyspnea or palpitations.  She received flu vaccine in the office today.  Past Medical History:  Diagnosis Date   Arthritis    GERD (gastroesophageal reflux disease)    Headache    Hypertension    Type 2 diabetes mellitus ([place])     Past Surgical History:  Procedure Laterality Date   ABDOMINAL HYSTERECTOMY     BLEPHAROPLASTY Bilateral    COLONOSCOPY N/A 06/17/2018   Procedure: COLONOSCOPY;  Surgeon: Rogene Houston, MD;  [facility];  Service: Endoscopy;  Laterality: N/A;  12:25   HEMORRHOID SURGERY     POLYPECTOMY  06/17/2018   Procedure: POLYPECTOMY;  Surgeon: Rogene Houston, MD;  [facility];  Service: Endoscopy;;  colon   TOTAL KNEE ARTHROPLASTY Left 01/26/2018   Procedure: TOTAL KNEE ARTHROPLASTY;  Surgeon: Carole Civil, MD;  Location: [facility];  Service: Orthopedics;  Laterality: Left;    Family History  Problem Relation Age of Onset   Diabetes Mother    Hypertension Mother    Stroke Sister     Social History   Socioeconomic History   Marital status: Married    Spouse name: Not on file   Number of children: Not on file   Years of education: Not  on file   Highest education level: Not on file  Occupational History   Not on file  Tobacco Use   Smoking status: Some Days    Types: Cigarettes   Smokeless tobacco: Never   Tobacco comments:    trying to quit   Vaping Use   Vaping Use: Never used  Substance and Sexual Activity   Alcohol use: No    Alcohol/week: 0.0 standard drinks   Drug use: No   Sexual activity: Yes  Other Topics Concern   Not on file  Social History Narrative   Not on file   Social Determinants of Health   Financial Resource Strain: Not on file  Food Insecurity: Not on file  Transportation Needs: Not on file  Physical Activity: Not on file  Stress: Not on file  Social Connections: Not on file  Intimate Partner Violence: Not on file    Outpatient Medications Prior to Visit  Medication Sig Dispense Refill   amLODipine (NORVASC) 10 MG tablet TAKE 1 TABLET BY MOUTH DAILY 90 tablet 1   Blood Glucose Monitoring Suppl (BLOOD GLUCOSE SYSTEM PAK) KIT Please dispense based on patient and insurance preference. Use as directed to monitor FSBS 1x daily. Dx: E11.9 1 each 1   FARXIGA 10 MG TABS tablet TAKE 1 TABLET BY MOUTH EVERY DAY 30 tablet 2  hydrOXYzine (ATARAX/VISTARIL) 10 MG tablet Take 1 tablet (10 mg total) by mouth 3 (three) times daily as needed. 30 tablet 0   lisinopril-hydrochlorothiazide (ZESTORETIC) 10-12.5 MG tablet TAKE 1 TABLET BT MOUTH DAILY 90 tablet 3   metFORMIN (GLUCOPHAGE-XR) 500 MG 24 hr tablet TAKE 1 TABLET BY MOUTH EVERY DAY WITH BREAKFAST 90 tablet 0   Multiple Vitamin (MULTIVITAMIN WITH MINERALS) TABS tablet Take 1 tablet by mouth 4 (four) times a week.     omeprazole (PRILOSEC) 40 MG capsule TAKE 1 CAPSULE BY MOUTH EVERY DAY 30 capsule 0   potassium chloride (KLOR-CON) 10 MEQ tablet TAKE 1 TABLET(10 MEQ) BY MOUTH DAILY 90 tablet 3   pravastatin (PRAVACHOL) 20 MG tablet TAKE 1 TABLET BY MOUTH EVERYDAY AT BEDTIME 90 tablet 0   Glucose Blood (BLOOD GLUCOSE TEST STRIPS) STRP Please  dispense based on patient and insurance preference. Use as directed to monitor FSBS 1x daily. Dx: E11.9 100 each 1   Lancet Devices MISC Please dispense based on patient and insurance preference. Use as directed to monitor FSBS 1x daily. Dx: E11.9 100 each 1   No facility-administered medications prior to visit.    Allergies  Allergen Reactions   Penicillins Rash     Has patient had a PCN reaction causing immediate rash, facial/tongue/throat swelling, SOB or lightheadedness with hypotension: Yes Has patient had a PCN reaction causing severe rash involving mucus membranes or skin necrosis: No Has patient had a PCN reaction that required hospitalization: No Has patient had a PCN reaction occurring within the last 10 years: No If all of the above answers are "NO", then may proceed with Cephalosporin use.     ROS Review of Systems  Constitutional:  Negative for chills and fever.  HENT:  Negative for congestion, sinus pressure, sinus pain and sore throat.   Eyes:  Negative for pain and discharge.  Respiratory:  Negative for cough and shortness of breath.   Cardiovascular:  Negative for chest pain and palpitations.  Gastrointestinal:  Negative for abdominal pain, constipation, nausea and vomiting.  Endocrine: Negative for polydipsia and polyuria.  Genitourinary:  Negative for dysuria and hematuria.  Musculoskeletal:  Positive for arthralgias. Negative for neck pain and neck stiffness.  Skin:  Negative for rash.  Neurological:  Negative for dizziness and weakness.  Psychiatric/Behavioral:  Negative for agitation and behavioral problems.      Objective:    Physical Exam Vitals reviewed.  Constitutional:      General: She is not in acute distress.    Appearance: She is not diaphoretic.  HENT:     Head: Normocephalic and atraumatic.     Nose: Nose normal.     Mouth/Throat:     Mouth: Mucous membranes are moist.  Eyes:     General: No scleral icterus.    Extraocular Movements:  Extraocular movements intact.  Neck:     Vascular: No carotid bruit.  Cardiovascular:     Rate and Rhythm: Normal rate and regular rhythm.     Pulses: Normal pulses.     Heart sounds: Normal heart sounds. No murmur heard. Pulmonary:     Breath sounds: Normal breath sounds. No wheezing or rales.  Musculoskeletal:     Cervical back: Neck supple. No tenderness.     Right lower leg: No edema.     Left lower leg: No edema.  Skin:    General: Skin is warm.     Findings: No rash.  Neurological:     General: No focal  deficit present.     Mental Status: She is alert and oriented to person, place, and time.     Sensory: No sensory deficit.     Motor: No weakness.  Psychiatric:        Mood and Affect: Mood normal.        Behavior: Behavior normal.    BP 118/82 (BP Location: Left Arm, Patient Position: Sitting, Cuff Size: Normal)    Pulse 73    Resp 18    Ht $R'5\' 1"'Sy$  (1.549 m)    Wt 160 lb 1.3 oz (72.6 kg)    SpO2 99%    BMI 30.25 kg/m  Wt Readings from Last 3 Encounters:  10/25/21 160 lb 1.3 oz (72.6 kg)  06/25/21 162 lb (73.5 kg)  04/15/21 155 lb (70.3 kg)    Lab Results  Component Value Date   TSH 1.280 06/19/2021   Lab Results  Component Value Date   WBC 7.6 06/19/2021   HGB 12.8 06/19/2021   HCT 38.0 06/19/2021   MCV 81 06/19/2021   PLT 376 06/19/2021   Lab Results  Component Value Date   NA 140 06/19/2021   K 4.1 06/19/2021   CO2 24 06/19/2021   GLUCOSE 120 (H) 06/19/2021   BUN 10 06/19/2021   CREATININE 0.88 06/19/2021   BILITOT 0.4 06/19/2021   ALKPHOS 79 06/19/2021   AST 12 06/19/2021   ALT 14 06/19/2021   PROT 6.6 06/19/2021   ALBUMIN 4.1 06/19/2021   CALCIUM 9.4 06/19/2021   ANIONGAP 8 09/18/2018   EGFR 74 06/19/2021   Lab Results  Component Value Date   CHOL 189 06/19/2021   Lab Results  Component Value Date   HDL 66 06/19/2021   Lab Results  Component Value Date   LDLCALC 110 (H) 06/19/2021   Lab Results  Component Value Date   TRIG 73  06/19/2021   Lab Results  Component Value Date   CHOLHDL 2.9 06/19/2021   Lab Results  Component Value Date   HGBA1C 6.8 (H) 06/19/2021      Assessment & Plan:   Problem List Items Addressed This Visit       Cardiovascular and Mediastinum   Essential hypertension, benign - Primary     BP Readings from Last 1 Encounters:  10/25/21 118/82  Well-controlled with Amlodipine and Zestoretic Counseled for compliance with the medications Advised DASH diet and moderate exercise/walking, at least 150 mins/week        Digestive   GERD (gastroesophageal reflux disease)    On omeprazole        Endocrine   DM (diabetes mellitus) ([place])    Lab Results  Component Value Date   HGBA1C 6.8 (H) 06/19/2021  With HTN, HLD and GERD On Metformin and Farxiga Advised to follow diabetic diet On statin and ACEi Diabetic eye exam: Advised to follow up with Ophthalmology for diabetic eye exam      Relevant Orders   Hemoglobin A1c   CMP14+EGFR     Musculoskeletal and Integument   Onychomycosis    Has completed Lamisil PO, slightly better Will continue Lamisil cream for now If persistent, will refer to Podiatry        Other   HLD (hyperlipidemia)    On statin Check lipid profile      Relevant Orders   Lipid panel    No orders of the defined types were placed in this encounter.   Follow-up: Return in about 4 months (around 02/23/2022) for DM and  HTN.    Lindell Spar, MD

## 2021-10-25 NOTE — Patient Instructions (Addendum)
Please use Lamisil for toenail infection.  Please continue to take other medications as prescribed.  Please continue to follow low carb diet and perform moderate exercise/walking at least 150 mins/week.  Please get fasting blood tests done before the next visit.

## 2021-10-25 NOTE — Assessment & Plan Note (Signed)
On statin Check lipid profile 

## 2021-11-16 ENCOUNTER — Other Ambulatory Visit: Payer: Self-pay | Admitting: Internal Medicine

## 2021-11-16 DIAGNOSIS — E119 Type 2 diabetes mellitus without complications: Secondary | ICD-10-CM

## 2021-11-18 ENCOUNTER — Telehealth: Payer: Self-pay | Admitting: Orthopedic Surgery

## 2021-11-18 NOTE — Telephone Encounter (Signed)
Call to patient-- left message regarding form for handicapped placard ready for pickup.

## 2021-11-23 ENCOUNTER — Other Ambulatory Visit: Payer: Self-pay | Admitting: Internal Medicine

## 2021-11-23 DIAGNOSIS — E119 Type 2 diabetes mellitus without complications: Secondary | ICD-10-CM

## 2021-11-24 ENCOUNTER — Other Ambulatory Visit: Payer: Self-pay | Admitting: Internal Medicine

## 2021-11-24 DIAGNOSIS — E119 Type 2 diabetes mellitus without complications: Secondary | ICD-10-CM

## 2021-11-25 ENCOUNTER — Other Ambulatory Visit: Payer: Self-pay

## 2021-11-25 ENCOUNTER — Ambulatory Visit (INDEPENDENT_AMBULATORY_CARE_PROVIDER_SITE_OTHER): Payer: BC Managed Care – PPO

## 2021-11-25 DIAGNOSIS — Z23 Encounter for immunization: Secondary | ICD-10-CM

## 2021-12-03 ENCOUNTER — Telehealth: Payer: Self-pay | Admitting: Orthopedic Surgery

## 2021-12-03 ENCOUNTER — Other Ambulatory Visit: Payer: Self-pay | Admitting: Orthopedic Surgery

## 2021-12-03 MED ORDER — CLINDAMYCIN HCL 300 MG PO CAPS: 600.0000 mg | ORAL_CAPSULE | Freq: Once | ORAL | 0 refills | Status: AC

## 2021-12-03 NOTE — Telephone Encounter (Signed)
Patient called to relay she has a dentist appointment 12/12/21, and is checking about pre-antibiotics due to having had total knee replacement. States she uses [facility] in [place]. Please advise. (Aware Dr Aline Brochure is in surgery today and clinic staff also not in today).

## 2021-12-03 NOTE — Telephone Encounter (Signed)
Is wendy there  We can send in per protocol  Clindamycin 600 mg one hour before since she has PCN allergy  I pended but cant send in from here, it keeps coming up as rheumatology even when I change to [employer]

## 2021-12-03 NOTE — Telephone Encounter (Signed)
Called patient to notify. Aware.

## 2021-12-23 LAB — HM MAMMOGRAPHY

## 2021-12-27 ENCOUNTER — Encounter: Payer: Self-pay | Admitting: *Deleted

## 2022-01-11 ENCOUNTER — Other Ambulatory Visit: Payer: Self-pay | Admitting: Internal Medicine

## 2022-01-11 DIAGNOSIS — E119 Type 2 diabetes mellitus without complications: Secondary | ICD-10-CM

## 2022-01-11 DIAGNOSIS — E7800 Pure hypercholesterolemia, unspecified: Secondary | ICD-10-CM

## 2022-01-14 MED ORDER — PRAVASTATIN SODIUM 20 MG PO TABS: 20.0000 mg | ORAL_TABLET | Freq: Every day | ORAL | 1 refills | Status: DC

## 2022-02-10 ENCOUNTER — Other Ambulatory Visit: Payer: Self-pay | Admitting: Internal Medicine

## 2022-02-10 DIAGNOSIS — E119 Type 2 diabetes mellitus without complications: Secondary | ICD-10-CM

## 2022-02-15 ENCOUNTER — Other Ambulatory Visit: Payer: Self-pay | Admitting: Internal Medicine

## 2022-02-15 DIAGNOSIS — E119 Type 2 diabetes mellitus without complications: Secondary | ICD-10-CM

## 2022-02-20 ENCOUNTER — Ambulatory Visit (INDEPENDENT_AMBULATORY_CARE_PROVIDER_SITE_OTHER): Payer: BC Managed Care – PPO

## 2022-02-20 DIAGNOSIS — Z23 Encounter for immunization: Secondary | ICD-10-CM | POA: Diagnosis not present

## 2022-02-27 ENCOUNTER — Encounter: Payer: Self-pay | Admitting: Internal Medicine

## 2022-02-27 ENCOUNTER — Ambulatory Visit: Payer: BC Managed Care – PPO | Admitting: Internal Medicine

## 2022-02-27 VITALS — BP 118/82 | HR 72 | Resp 18 | Ht 61.0 in | Wt 155.4 lb

## 2022-02-27 DIAGNOSIS — K219 Gastro-esophageal reflux disease without esophagitis: Secondary | ICD-10-CM

## 2022-02-27 DIAGNOSIS — E782 Mixed hyperlipidemia: Secondary | ICD-10-CM | POA: Diagnosis not present

## 2022-02-27 DIAGNOSIS — E1169 Type 2 diabetes mellitus with other specified complication: Secondary | ICD-10-CM

## 2022-02-27 DIAGNOSIS — Z720 Tobacco use: Secondary | ICD-10-CM | POA: Diagnosis not present

## 2022-02-27 DIAGNOSIS — I10 Essential (primary) hypertension: Secondary | ICD-10-CM | POA: Diagnosis not present

## 2022-02-27 MED ORDER — OMEPRAZOLE 20 MG PO CPDR: 20.0000 mg | DELAYED_RELEASE_CAPSULE | Freq: Every day | ORAL | 5 refills | Status: DC

## 2022-02-27 NOTE — Assessment & Plan Note (Signed)
?  BP Readings from Last 1 Encounters:  ?02/27/22 118/82  ? ?Well-controlled with Amlodipine and Zestoretic ?Counseled for compliance with the medications ?Advised DASH diet and moderate exercise/walking, at least 150 mins/week ?

## 2022-02-27 NOTE — Assessment & Plan Note (Signed)
On statin Check lipid profile 

## 2022-02-27 NOTE — Assessment & Plan Note (Signed)
Smokes 0.5 pack/day ? ?Asked about quitting: confirms that she currently smokes cigarettes ?Advise to quit smoking: Educated about QUITTING to reduce the risk of cancer, cardio and cerebrovascular disease. ?Assess willingness: Unwilling to quit at this time, but is working on cutting back. ?Assist with counseling and pharmacotherapy: Counseled for 5 minutes and literature provided. ?Arrange for follow up: Follow up in 3 months and continue to offer help. ?

## 2022-02-27 NOTE — Assessment & Plan Note (Signed)
Started omeprazole 20 mg daily ?Avoid hot and spicy food ?

## 2022-02-27 NOTE — Progress Notes (Signed)
? ?Established Patient Office Visit ? ?Subjective:  ?Patient ID: Danielle Hicks, female    DOB: [birth year]  Age: 66 y.o. MRN: [id number] ? ?CC:  ?Chief Complaint  ?Patient presents with  ? Follow-up  ?  4 month follow up   ? ? ?HPI ?Danielle Hicks is a 66 y.o. female with past medical history of HTN, type 2 DM, GERD and tobacco abuse who presents for f/u of her chronic medical conditions. ? ?DM: Her HbA1C was 6.6 in the last office visit, which was better compared to prior. She has been taking Metformin and Farxiga regularly. She checks her blood glucose regularly, ranges aroun 100-120. She states that she needs to improve her diet still, but has tried to cut down on pancakes and soda. ? ?HTN: BP is well-controlled. Takes medications regularly. Patient denies headache, dizziness, chest pain, dyspnea or palpitations. ? ?GERD: She was prescribed omeprazole 40 mg in the past, but has been taking OTC antacid as she thought that it was too much for her.  She currently denies any dysphagia or odynophagia. ? ? ?Past Medical History:  ?Diagnosis Date  ? Arthritis   ? GERD (gastroesophageal reflux disease)   ? Headache   ? Hypertension   ? Type 2 diabetes mellitus ([place])   ? ? ?Past Surgical History:  ?Procedure Laterality Date  ? ABDOMINAL HYSTERECTOMY    ? BLEPHAROPLASTY Bilateral   ? COLONOSCOPY N/A 06/17/2018  ? Procedure: COLONOSCOPY;  Surgeon: Rogene Houston, MD;  [facility];  Service: Endoscopy;  Laterality: N/A;  12:25  ? HEMORRHOID SURGERY    ? POLYPECTOMY  06/17/2018  ? Procedure: POLYPECTOMY;  Surgeon: Rogene Houston, MD;  Location: [facility];  Service: Endoscopy;;  colon  ? TOTAL KNEE ARTHROPLASTY Left 01/26/2018  ? Procedure: TOTAL KNEE ARTHROPLASTY;  Surgeon: Carole Civil, MD;  Location: [facility];  Service: Orthopedics;  Laterality: Left;  ? ? ?Family History  ?Problem Relation Age of Onset  ? Diabetes Mother   ? Hypertension Mother   ? Stroke Sister   ? ? ?Social History   ? ?Socioeconomic History  ? Marital status: Married  ?  Spouse name: Not on file  ? Number of children: Not on file  ? Years of education: Not on file  ? Highest education level: Not on file  ?Occupational History  ? Not on file  ?Tobacco Use  ? Smoking status: Some Days  ?  Types: Cigarettes  ? Smokeless tobacco: Never  ? Tobacco comments:  ?  trying to quit   ?Vaping Use  ? Vaping Use: Never used  ?Substance and Sexual Activity  ? Alcohol use: No  ?  Alcohol/week: 0.0 standard drinks  ? Drug use: No  ? Sexual activity: Yes  ?Other Topics Concern  ? Not on file  ?Social History Narrative  ? Not on file  ? ?Social Determinants of Health  ? ?Financial Resource Strain: Not on file  ?Food Insecurity: Not on file  ?Transportation Needs: Not on file  ?Physical Activity: Not on file  ?Stress: Not on file  ?Social Connections: Not on file  ?Intimate Partner Violence: Not on file  ? ? ?Outpatient Medications Prior to Visit  ?Medication Sig Dispense Refill  ? amLODipine (NORVASC) 10 MG tablet TAKE 1 TABLET BY MOUTH EVERY DAY 90 tablet 1  ? Blood Glucose Monitoring Suppl (BLOOD GLUCOSE SYSTEM PAK) KIT Please dispense based on patient and insurance preference. Use as directed to monitor FSBS 1x  daily. Dx: E11.9 1 each 1  ? FARXIGA 10 MG TABS tablet TAKE 1 TABLET BY MOUTH EVERY DAY 30 tablet 2  ? hydrOXYzine (ATARAX/VISTARIL) 10 MG tablet Take 1 tablet (10 mg total) by mouth 3 (three) times daily as needed. 30 tablet 0  ? lisinopril-hydrochlorothiazide (ZESTORETIC) 10-12.5 MG tablet TAKE 1 TABLET BT MOUTH DAILY 90 tablet 3  ? metFORMIN (GLUCOPHAGE-XR) 500 MG 24 hr tablet TAKE 1 TABLET BY MOUTH EVERY DAY WITH BREAKFAST 90 tablet 0  ? Multiple Vitamin (MULTIVITAMIN WITH MINERALS) TABS tablet Take 1 tablet by mouth 4 (four) times a week.    ? potassium chloride (KLOR-CON) 10 MEQ tablet TAKE 1 TABLET(10 MEQ) BY MOUTH DAILY 90 tablet 3  ? pravastatin (PRAVACHOL) 20 MG tablet Take 1 tablet (20 mg total) by mouth daily. 90 tablet 1   ? omeprazole (PRILOSEC) 40 MG capsule TAKE 1 CAPSULE BY MOUTH EVERY DAY 30 capsule 0  ? ?No facility-administered medications prior to visit.  ? ? ?Allergies  ?Allergen Reactions  ? Penicillins Rash  ?   ?Has patient had a PCN reaction causing immediate rash, facial/tongue/throat swelling, SOB or lightheadedness with hypotension: Yes ?Has patient had a PCN reaction causing severe rash involving mucus membranes or skin necrosis: No ?Has patient had a PCN reaction that required hospitalization: No ?Has patient had a PCN reaction occurring within the last 10 years: No ?If all of the above answers are "NO", then may proceed with Cephalosporin use. ?  ? ? ?ROS ?Review of Systems  ?Constitutional:  Negative for chills and fever.  ?HENT:  Negative for congestion, sinus pressure, sinus pain and sore throat.   ?Eyes:  Negative for pain and discharge.  ?Respiratory:  Negative for cough and shortness of breath.   ?Cardiovascular:  Negative for chest pain and palpitations.  ?Gastrointestinal:  Negative for abdominal pain, constipation, nausea and vomiting.  ?Endocrine: Negative for polydipsia and polyuria.  ?Genitourinary:  Negative for dysuria and hematuria.  ?Musculoskeletal:  Positive for arthralgias. Negative for neck pain and neck stiffness.  ?Skin:  Negative for rash.  ?Neurological:  Negative for dizziness and weakness.  ?Psychiatric/Behavioral:  Negative for agitation and behavioral problems.   ? ?  ?Objective:  ?  ?Physical Exam ?Vitals reviewed.  ?Constitutional:   ?   General: She is not in acute distress. ?   Appearance: She is not diaphoretic.  ?HENT:  ?   Head: Normocephalic and atraumatic.  ?   Nose: Nose normal.  ?   Mouth/Throat:  ?   Mouth: Mucous membranes are moist.  ?Eyes:  ?   General: No scleral icterus. ?   Extraocular Movements: Extraocular movements intact.  ?Neck:  ?   Vascular: No carotid bruit.  ?Cardiovascular:  ?   Rate and Rhythm: Normal rate and regular rhythm.  ?   Pulses: Normal pulses.  ?    Heart sounds: Normal heart sounds. No murmur heard. ?Pulmonary:  ?   Breath sounds: Normal breath sounds. No wheezing or rales.  ?Abdominal:  ?   Palpations: Abdomen is soft.  ?   Tenderness: There is no abdominal tenderness.  ?Musculoskeletal:  ?   Cervical back: Neck supple. No tenderness.  ?   Right lower leg: No edema.  ?   Left lower leg: No edema.  ?Skin: ?   General: Skin is warm.  ?   Findings: No rash.  ?Neurological:  ?   General: No focal deficit present.  ?   Mental Status: She is  alert and oriented to person, place, and time.  ?   Sensory: No sensory deficit.  ?   Motor: No weakness.  ?Psychiatric:     ?   Mood and Affect: Mood normal.     ?   Behavior: Behavior normal.  ? ? ?BP 118/82 (BP Location: Left Arm, Patient Position: Sitting, Cuff Size: Normal)   Pulse 72   Resp 18   Ht _0  (1.549 m)   Wt 155 lb 6.4 oz (70.5 kg)   SpO2 96%   BMI 29.36 kg/m?  ?Wt Readings from Last 3 Encounters:  ?02/27/22 155 lb 6.4 oz (70.5 kg)  ?10/25/21 160 lb 1.3 oz (72.6 kg)  ?06/25/21 162 lb (73.5 kg)  ? ? ?Lab Results  ?Component Value Date  ? TSH 1.280 06/19/2021  ? ?Lab Results  ?Component Value Date  ? WBC 7.6 06/19/2021  ? HGB 12.8 06/19/2021  ? HCT 38.0 06/19/2021  ? MCV 81 06/19/2021  ? PLT 376 06/19/2021  ? ?Lab Results  ?Component Value Date  ? NA 140 06/19/2021  ? K 4.1 06/19/2021  ? CO2 24 06/19/2021  ? GLUCOSE 120 (H) 06/19/2021  ? BUN 10 06/19/2021  ? CREATININE 0.88 06/19/2021  ? BILITOT 0.4 06/19/2021  ? ALKPHOS 79 06/19/2021  ? AST 12 06/19/2021  ? ALT 14 06/19/2021  ? PROT 6.6 06/19/2021  ? ALBUMIN 4.1 06/19/2021  ? CALCIUM 9.4 06/19/2021  ? ANIONGAP 8 09/18/2018  ? EGFR 74 06/19/2021  ? ?Lab Results  ?Component Value Date  ? CHOL 189 06/19/2021  ? ?Lab Results  ?Component Value Date  ? HDL 66 06/19/2021  ? ?Lab Results  ?Component Value Date  ? LDLCALC 110 (H) 06/19/2021  ? ?Lab Results  ?Component Value Date  ? TRIG 73 06/19/2021  ? ?Lab Results  ?Component Value Date  ? CHOLHDL 2.9 06/19/2021   ? ?Lab Results  ?Component Value Date  ? HGBA1C 6.6 10/25/2021  ? ? ?  ?Assessment & Plan:  ? ?Problem List Items Addressed This Visit   ? ?  ? Cardiovascular and Mediastinum  ? Essential hypertension, benign  ?   ?BP R

## 2022-02-27 NOTE — Assessment & Plan Note (Signed)
Lab Results  ?Component Value Date  ? HGBA1C 6.6 10/25/2021  ? ?With HTN, HLD and GERD ?On Metformin and Farxiga ?Advised to follow diabetic diet ?On statin and ACEi ?Diabetic eye exam: Advised to follow up with Ophthalmology for diabetic eye exam ?

## 2022-02-27 NOTE — Patient Instructions (Signed)
Please continue taking medications as prescribed.  Please continue to follow low carb diet and ambulate as tolerated. 

## 2022-03-01 LAB — MICROALBUMIN / CREATININE URINE RATIO
Creatinine, Urine: 42.7 mg/dL
Microalb/Creat Ratio: <7 mg/g creat (ref 0–29)
Microalbumin, Urine: <3 ug/mL

## 2022-03-07 ENCOUNTER — Encounter: Payer: Self-pay | Admitting: Family Medicine

## 2022-03-07 ENCOUNTER — Ambulatory Visit (INDEPENDENT_AMBULATORY_CARE_PROVIDER_SITE_OTHER): Payer: BC Managed Care – PPO | Admitting: Family Medicine

## 2022-03-07 DIAGNOSIS — H1032 Unspecified acute conjunctivitis, left eye: Secondary | ICD-10-CM | POA: Diagnosis not present

## 2022-03-07 LAB — BASIC METABOLIC PANEL
BUN/Creatinine Ratio: 14 (ref 12–28)
BUN: 12 mg/dL (ref 8–27)
CO2: 25 mmol/L (ref 20–29)
Calcium: 9.4 mg/dL (ref 8.7–10.3)
Chloride: 107 mmol/L — ABNORMAL HIGH (ref 96–106)
Creatinine, Ser: 0.83 mg/dL (ref 0.57–1.00)
Glucose: 114 mg/dL — ABNORMAL HIGH (ref 70–99)
Potassium: 4.3 mmol/L (ref 3.5–5.2)
Sodium: 146 mmol/L — ABNORMAL HIGH (ref 134–144)
eGFR: 79 mL/min/{1.73_m2} (ref >59)

## 2022-03-07 LAB — HEMOGLOBIN A1C
Est. average glucose Bld gHb Est-mCnc: 148 mg/dL
Hgb A1c MFr Bld: 6.8 % — ABNORMAL HIGH (ref 4.8–5.6)

## 2022-03-07 LAB — LIPID PANEL
Chol/HDL Ratio: 2.6 ratio (ref 0.0–4.4)
Cholesterol, Total: 174 mg/dL (ref 100–199)
HDL: 66 mg/dL (ref >39)
LDL Chol Calc (NIH): 95 mg/dL (ref 0–99)
Triglycerides: 66 mg/dL (ref 0–149)
VLDL Cholesterol Cal: 13 mg/dL (ref 5–40)

## 2022-03-07 MED ORDER — OFLOXACIN 0.3 % OP SOLN: 1.0000 [drp] | Freq: Four times a day (QID) | OPHTHALMIC | 0 refills | Status: DC

## 2022-03-07 NOTE — Progress Notes (Signed)
?  ? ?Virtual Visit via Telephone Note  ? ?This visit type was conducted due to national recommendations for restrictions regarding the COVID-19 Pandemic (e.g. social distancing) in an effort to limit this patient's exposure and mitigate transmission in our community.  Due to her co-morbid illnesses, this patient is at least at moderate risk for complications without adequate follow up.  This format is felt to be most appropriate for this patient at this time.  The patient did not have access to video technology/had technical difficulties with video requiring transitioning to audio format only (telephone).  All issues noted in this document were discussed and addressed.  No physical exam could be performed with this format.  Please refer to the patient's chart for her  consent to telehealth for [facility].  ? ?Evaluation Performed:  Acute Visit ? ?Date:  03/07/2022  ? ?ID:  JENICA COSTILOW, DOB 1956/06/26, MRN [id number] ? ?Patient Location: Home ?Provider Location: Office/Clinic ? ?Participants: Patient ?Location of Patient: Home ?Location of Provider: Telehealth ?Consent was obtain for visit to be over via telehealth. ?I verified that I am speaking with the correct person using two identifiers. ? ?PCP:  Lindell Spar, MD  ? ?Chief Complaint:  Red sticky eyes ? ?History of Present Illness:   ?Danielle Hicks is a 66 y.o. female with PMH of essential hypertension, GERD, DM, and tobacco abuse present today with a complaint of left eye redness and matting upon awakening this morning. She reported itching with the sensation of something in her eyes and sensitivity to lights. She denies headaches, nausea, and diminished visual acuity in the affected eye.  ?   ?The patient does not have symptoms concerning for COVID-19 infection (fever, chills, cough, or new shortness of breath).  ? ?Past Medical, Surgical, Social History, Allergies, and Medications have been Reviewed. ? ?Past Medical History:  ?Diagnosis Date   ? Arthritis   ? GERD (gastroesophageal reflux disease)   ? Headache   ? Hypertension   ? Type 2 diabetes mellitus ([place])   ? ?Past Surgical History:  ?Procedure Laterality Date  ? ABDOMINAL HYSTERECTOMY    ? BLEPHAROPLASTY Bilateral   ? COLONOSCOPY N/A 06/17/2018  ? Procedure: COLONOSCOPY;  Surgeon: Rogene Houston, MD;  [facility];  Service: Endoscopy;  Laterality: N/A;  12:25  ? HEMORRHOID SURGERY    ? POLYPECTOMY  06/17/2018  ? Procedure: POLYPECTOMY;  Surgeon: Rogene Houston, MD;  [facility];  Service: Endoscopy;;  colon  ? TOTAL KNEE ARTHROPLASTY Left 01/26/2018  ? Procedure: TOTAL KNEE ARTHROPLASTY;  Surgeon: Carole Civil, MD;  Location: [facility];  Service: Orthopedics;  Laterality: Left;  ?  ? ?Current Meds  ?Medication Sig  ? ofloxacin (OCUFLOX) 0.3 % ophthalmic solution Place 1 drop into the left eye 4 (four) times daily.  ?  ? ?Allergies:   Penicillins  ? ?ROS:   ?Please see the history of present illness.    ? ?All other systems reviewed and are negative. ? ? ?Labs/Other Tests and Data Reviewed:   ? ?Recent Labs: ?06/19/2021: ALT 14; Hemoglobin 12.8; Platelets 376; TSH 1.280 ?03/06/2022: BUN 12; Creatinine, Ser 0.83; Potassium 4.3; Sodium 146  ? ?Recent Lipid Panel ?Lab Results  ?Component Value Date/Time  ? CHOL 174 03/06/2022 08:07 AM  ? TRIG 66 03/06/2022 08:07 AM  ? HDL 66 03/06/2022 08:07 AM  ? CHOLHDL 2.6 03/06/2022 08:07 AM  ? CHOLHDL 4.5 10/29/2020 02:20 PM  ? [place] 95 03/06/2022 08:07  AM  ? LDLCALC 144 (H) 10/29/2020 02:20 PM  ? ? ?Wt Readings from Last 3 Encounters:  ?02/27/22 155 lb 6.4 oz (70.5 kg)  ?10/25/21 160 lb 1.3 oz (72.6 kg)  ?06/25/21 162 lb (73.5 kg)  ?  ? ?Objective:   ? ?Vital Signs:  There were no vitals taken for this visit.  ? ? ? ?ASSESSMENT & PLAN:   ? Bacterial Conjunctivitis ?Educated the patient that bacterial conjunctivitis is highly contagious ?Advised to perform good hand and eyes hygiene (wash hands frequently to prevent the spread to other  eye) ?Use clean washcloth each time face is washed ?Change pillowcase daily until condition resolved ?Ofloxacin eye drops ordered. ? ?Time:   ?Today, I have spent 18 minutes reviewing the chart, including problem list, medications, and with the patient with telehealth technology discussing the above problems. ? ? ?Medication Adjustments/Labs and Tests Ordered: ?Current medicines are reviewed at length with the patient today.  Concerns regarding medicines are outlined above.  ? ?Tests Ordered: ?No orders of the defined types were placed in this encounter. ? ? ?Medication Changes: ?Meds ordered this encounter  ?Medications  ? ofloxacin (OCUFLOX) 0.3 % ophthalmic solution  ?  Sig: Place 1 drop into the left eye 4 (four) times daily.  ?  Dispense:  5 mL  ?  Refill:  0  ? ? ? ?Note: This dictation was prepared with Dragon dictation along with smaller phrase technology. Similar sounding words can be transcribed inadequately or may not be corrected upon review. Any transcriptional errors that result from this process are unintentional.  ?  ? ? ?Disposition:  Follow up  ?Signed, ?Alvira Monday, FNP  ?03/07/2022 2:53 PM    ? ?[facility] ?[facility] ?

## 2022-05-18 ENCOUNTER — Other Ambulatory Visit: Payer: Self-pay | Admitting: Family Medicine

## 2022-05-18 DIAGNOSIS — E119 Type 2 diabetes mellitus without complications: Secondary | ICD-10-CM

## 2022-05-22 ENCOUNTER — Other Ambulatory Visit: Payer: Self-pay | Admitting: Internal Medicine

## 2022-05-22 DIAGNOSIS — E119 Type 2 diabetes mellitus without complications: Secondary | ICD-10-CM

## 2022-07-03 ENCOUNTER — Other Ambulatory Visit: Payer: Self-pay | Admitting: Internal Medicine

## 2022-07-03 DIAGNOSIS — E119 Type 2 diabetes mellitus without complications: Secondary | ICD-10-CM

## 2022-07-07 ENCOUNTER — Telehealth: Payer: Self-pay | Admitting: Orthopedic Surgery

## 2022-07-07 MED ORDER — CEPHALEXIN 500 MG PO CAPS: 2000.0000 mg | ORAL_CAPSULE | Freq: Once | ORAL | 2 refills | Status: AC

## 2022-07-07 NOTE — Telephone Encounter (Signed)
Sent per protocol

## 2022-07-07 NOTE — Telephone Encounter (Signed)
Patient called for pre-antibiotics for dental cleaning appointment scheduled this Wednesday, 07/09/22; states uses Danielle Hicks

## 2022-07-10 ENCOUNTER — Encounter: Payer: BC Managed Care – PPO | Admitting: Internal Medicine

## 2022-07-17 ENCOUNTER — Other Ambulatory Visit: Payer: Self-pay | Admitting: Internal Medicine

## 2022-07-21 ENCOUNTER — Encounter: Payer: Self-pay | Admitting: Internal Medicine

## 2022-07-21 ENCOUNTER — Ambulatory Visit (INDEPENDENT_AMBULATORY_CARE_PROVIDER_SITE_OTHER): Payer: BC Managed Care – PPO | Admitting: Internal Medicine

## 2022-07-21 VITALS — BP 128/84 | HR 66 | Resp 18 | Ht 61.0 in | Wt 152.8 lb

## 2022-07-21 DIAGNOSIS — E1169 Type 2 diabetes mellitus with other specified complication: Secondary | ICD-10-CM | POA: Diagnosis not present

## 2022-07-21 DIAGNOSIS — Z23 Encounter for immunization: Secondary | ICD-10-CM

## 2022-07-21 DIAGNOSIS — I10 Essential (primary) hypertension: Secondary | ICD-10-CM | POA: Diagnosis not present

## 2022-07-21 DIAGNOSIS — E559 Vitamin D deficiency, unspecified: Secondary | ICD-10-CM

## 2022-07-21 DIAGNOSIS — K219 Gastro-esophageal reflux disease without esophagitis: Secondary | ICD-10-CM

## 2022-07-21 DIAGNOSIS — Z720 Tobacco use: Secondary | ICD-10-CM

## 2022-07-21 DIAGNOSIS — Z0001 Encounter for general adult medical examination with abnormal findings: Secondary | ICD-10-CM | POA: Diagnosis not present

## 2022-07-21 DIAGNOSIS — Z122 Encounter for screening for malignant neoplasm of respiratory organs: Secondary | ICD-10-CM

## 2022-07-21 DIAGNOSIS — E782 Mixed hyperlipidemia: Secondary | ICD-10-CM

## 2022-07-21 NOTE — Assessment & Plan Note (Signed)
Physical exam as documented. Fasting blood tests ordered. Flu vaccine today. 

## 2022-07-21 NOTE — Patient Instructions (Signed)
Please continue taking medications as prescribed.  Please continue to follow low carb diet and ambulate as tolerated. 

## 2022-07-21 NOTE — Assessment & Plan Note (Signed)
Lab Results  Component Value Date   HGBA1C 6.8 (H) 03/06/2022   With HTN, HLD and GERD On Metformin and Farxiga Advised to follow diabetic diet On statin and ACEi Diabetic eye exam: Advised to follow up with Ophthalmology for diabetic eye exam

## 2022-07-21 NOTE — Assessment & Plan Note (Signed)
  BP Readings from Last 1 Encounters:  07/21/22 128/84   Well-controlled with Amlodipine and Zestoretic Counseled for compliance with the medications Advised DASH diet and moderate exercise/walking, at least 150 mins/week

## 2022-07-21 NOTE — Progress Notes (Signed)
Established Patient Office Visit  Subjective:  Patient ID: Danielle Hicks, female    DOB: Aug 04, 1956  Age: 66 y.o. MRN: [id number]  CC:  Chief Complaint  Patient presents with   Annual Exam    Annual exam     HPI Danielle Hicks is a 66 y.o. female with past medical history of HTN, type 2 DM, GERD and tobacco abuse who presents for annual physical.  DM: Her HbA1C was 6.8 in the last office visit, which was stable compared to prior. She has been taking Metformin and Farxiga regularly. She checks her blood glucose regularly, ranges aroun 100-120. She states that she needs to improve her diet still.  HTN: BP is well-controlled. Takes medications regularly. Patient denies headache, dizziness, chest pain, dyspnea or palpitations.   GERD: She was prescribed omeprazole 20 mg in the past, but has been taking OTC antacid as she had constipation with omeprazole.  She currently denies any dysphagia or odynophagia.      Past Medical History:  Diagnosis Date   Arthritis    GERD (gastroesophageal reflux disease)    Headache    Hypertension    Type 2 diabetes mellitus ([place])     Past Surgical History:  Procedure Laterality Date   ABDOMINAL HYSTERECTOMY     BLEPHAROPLASTY Bilateral    COLONOSCOPY N/A 06/17/2018   Procedure: COLONOSCOPY;  Surgeon: Rogene Houston, MD;  [facility];  Service: Endoscopy;  Laterality: N/A;  12:25   HEMORRHOID SURGERY     POLYPECTOMY  06/17/2018   Procedure: POLYPECTOMY;  Surgeon: Rogene Houston, MD;  Location: [facility];  Service: Endoscopy;;  colon   TOTAL KNEE ARTHROPLASTY Left 01/26/2018   Procedure: TOTAL KNEE ARTHROPLASTY;  Surgeon: Carole Civil, MD;  Location: [facility];  Service: Orthopedics;  Laterality: Left;    Family History  Problem Relation Age of Onset   Diabetes Mother    Hypertension Mother    Stroke Sister     Social History   Socioeconomic History   Marital status: Married    Spouse name: Not on file    Number of children: Not on file   Years of education: Not on file   Highest education level: Not on file  Occupational History   Not on file  Tobacco Use   Smoking status: Some Days    Types: Cigarettes   Smokeless tobacco: Never   Tobacco comments:    trying to quit   Vaping Use   Vaping Use: Never used  Substance and Sexual Activity   Alcohol use: No    Alcohol/week: 0.0 standard drinks of alcohol   Drug use: No   Sexual activity: Yes  Other Topics Concern   Not on file  Social History Narrative   Not on file   Social Determinants of Health   Financial Resource Strain: Not on file  Food Insecurity: Not on file  Transportation Needs: Not on file  Physical Activity: Not on file  Stress: Not on file  Social Connections: Not on file  Intimate Partner Violence: Not on file    Outpatient Medications Prior to Visit  Medication Sig Dispense Refill   amLODipine (NORVASC) 10 MG tablet TAKE 1 TABLET BY MOUTH EVERY DAY 90 tablet 1   Blood Glucose Monitoring Suppl (BLOOD GLUCOSE SYSTEM PAK) KIT Please dispense based on patient and insurance preference. Use as directed to monitor FSBS 1x daily. Dx: E11.9 1 each 1   FARXIGA 10 MG TABS tablet  TAKE 1 TABLET BY MOUTH EVERY DAY 30 tablet 2   hydrOXYzine (ATARAX/VISTARIL) 10 MG tablet Take 1 tablet (10 mg total) by mouth 3 (three) times daily as needed. 30 tablet 0   lisinopril-hydrochlorothiazide (ZESTORETIC) 10-12.5 MG tablet TAKE 1 TABLET BT MOUTH DAILY 90 tablet 3   metFORMIN (GLUCOPHAGE-XR) 500 MG 24 hr tablet TAKE 1 TABLET BY MOUTH EVERY DAY WITH BREAKFAST 90 tablet 0   Multiple Vitamin (MULTIVITAMIN WITH MINERALS) TABS tablet Take 1 tablet by mouth 4 (four) times a week.     omeprazole (PRILOSEC) 20 MG capsule Take 1 capsule (20 mg total) by mouth daily. 30 capsule 5   potassium chloride (KLOR-CON) 10 MEQ tablet TAKE 1 TABLET(10 MEQ) BY MOUTH DAILY 90 tablet 3   pravastatin (PRAVACHOL) 20 MG tablet Take 1 tablet (20 mg total) by  mouth daily. 90 tablet 1   ofloxacin (OCUFLOX) 0.3 % ophthalmic solution Place 1 drop into the left eye 4 (four) times daily. 5 mL 0   No facility-administered medications prior to visit.    Allergies  Allergen Reactions   Penicillins Rash     Has patient had a PCN reaction causing immediate rash, facial/tongue/throat swelling, SOB or lightheadedness with hypotension: Yes Has patient had a PCN reaction causing severe rash involving mucus membranes or skin necrosis: No Has patient had a PCN reaction that required hospitalization: No Has patient had a PCN reaction occurring within the last 10 years: No If all of the above answers are "NO", then may proceed with Cephalosporin use.     ROS Review of Systems  Constitutional:  Negative for chills and fever.  HENT:  Negative for congestion, sinus pressure, sinus pain and sore throat.   Eyes:  Negative for pain and discharge.  Respiratory:  Negative for cough and shortness of breath.   Cardiovascular:  Negative for chest pain and palpitations.  Gastrointestinal:  Negative for abdominal pain, constipation, nausea and vomiting.  Endocrine: Negative for polydipsia and polyuria.  Genitourinary:  Negative for dysuria and hematuria.  Musculoskeletal:  Positive for arthralgias. Negative for neck pain and neck stiffness.  Skin:  Negative for rash.  Neurological:  Negative for dizziness and weakness.  Psychiatric/Behavioral:  Negative for agitation and behavioral problems.       Objective:    Physical Exam Vitals reviewed.  Constitutional:      General: She is not in acute distress.    Appearance: She is not diaphoretic.  HENT:     Head: Normocephalic and atraumatic.     Nose: Nose normal.     Mouth/Throat:     Mouth: Mucous membranes are moist.  Eyes:     General: No scleral icterus.    Extraocular Movements: Extraocular movements intact.  Neck:     Vascular: No carotid bruit.  Cardiovascular:     Rate and Rhythm: Normal rate and  regular rhythm.     Pulses: Normal pulses.     Heart sounds: Normal heart sounds. No murmur heard. Pulmonary:     Breath sounds: Normal breath sounds. No wheezing or rales.  Abdominal:     Palpations: Abdomen is soft.     Tenderness: There is no abdominal tenderness.  Musculoskeletal:     Cervical back: Neck supple. No tenderness.     Right lower leg: No edema.     Left lower leg: No edema.  Skin:    General: Skin is warm.     Findings: No rash.  Neurological:     General:  No focal deficit present.     Mental Status: She is alert and oriented to person, place, and time.     Cranial Nerves: No cranial nerve deficit.     Sensory: No sensory deficit.     Motor: No weakness.  Psychiatric:        Mood and Affect: Mood normal.        Behavior: Behavior normal.     BP 128/84 (BP Location: Right Arm, Patient Position: Sitting, Cuff Size: Normal)   Pulse 66   Resp 18   Ht 5' 1"  (1.549 m)   Wt 152 lb 12.8 oz (69.3 kg)   SpO2 97%   BMI 28.87 kg/m  Wt Readings from Last 3 Encounters:  07/21/22 152 lb 12.8 oz (69.3 kg)  02/27/22 155 lb 6.4 oz (70.5 kg)  10/25/21 160 lb 1.3 oz (72.6 kg)    Lab Results  Component Value Date   TSH 1.280 06/19/2021   Lab Results  Component Value Date   WBC 7.6 06/19/2021   HGB 12.8 06/19/2021   HCT 38.0 06/19/2021   MCV 81 06/19/2021   PLT 376 06/19/2021   Lab Results  Component Value Date   NA 146 (H) 03/06/2022   K 4.3 03/06/2022   CO2 25 03/06/2022   GLUCOSE 114 (H) 03/06/2022   BUN 12 03/06/2022   CREATININE 0.83 03/06/2022   BILITOT 0.4 06/19/2021   ALKPHOS 79 06/19/2021   AST 12 06/19/2021   ALT 14 06/19/2021   PROT 6.6 06/19/2021   ALBUMIN 4.1 06/19/2021   CALCIUM 9.4 03/06/2022   ANIONGAP 8 09/18/2018   EGFR 79 03/06/2022   Lab Results  Component Value Date   CHOL 174 03/06/2022   Lab Results  Component Value Date   HDL 66 03/06/2022   Lab Results  Component Value Date   LDLCALC 95 03/06/2022   Lab Results   Component Value Date   TRIG 66 03/06/2022   Lab Results  Component Value Date   CHOLHDL 2.6 03/06/2022   Lab Results  Component Value Date   HGBA1C 6.8 (H) 03/06/2022      Assessment & Plan:   Problem List Items Addressed This Visit       Cardiovascular and Mediastinum   Essential hypertension, benign     BP Readings from Last 1 Encounters:  07/21/22 128/84  Well-controlled with Amlodipine and Zestoretic Counseled for compliance with the medications Advised DASH diet and moderate exercise/walking, at least 150 mins/week      Relevant Orders   TSH   CMP14+EGFR   CBC with Differential/Platelet     Digestive   GERD (gastroesophageal reflux disease)    Well-controlled with OTC antacid for now Avoid hot and spicy food        Endocrine   DM (diabetes mellitus) ([place])    Lab Results  Component Value Date   HGBA1C 6.8 (H) 03/06/2022  With HTN, HLD and GERD On Metformin and Farxiga Advised to follow diabetic diet On statin and ACEi Diabetic eye exam: Advised to follow up with Ophthalmology for diabetic eye exam      Relevant Orders   Hemoglobin A1c   CMP14+EGFR     Other   Tobacco abuse   Relevant Orders   CT CHEST LUNG CANCER SCREENING LOW DOSE WO CONTRAST   Encounter for general adult medical examination with abnormal findings - Primary    Physical exam as documented. Fasting blood tests ordered. Flu vaccine today.  Relevant Orders   TSH   CBC with Differential/Platelet   HLD (hyperlipidemia)    On statin Check lipid profile      Relevant Orders   Lipid panel   Other Visit Diagnoses     Vitamin D deficiency       Relevant Orders   VITAMIN D 25 Hydroxy (Vit-D Deficiency, Fractures)   Need for immunization against influenza       Relevant Orders   Flu Vaccine QUAD 42moIM (Fluarix, Fluzone & Alfiuria Quad PF) (Completed)   Screening for lung cancer       Relevant Orders   CT CHEST LUNG CANCER SCREENING LOW DOSE WO CONTRAST        No orders of the defined types were placed in this encounter.   Follow-up: Return in about 6 months (around 01/19/2023) for DM and HTN.    RLindell Spar MD

## 2022-07-21 NOTE — Assessment & Plan Note (Signed)
On statin Check lipid profile 

## 2022-07-21 NOTE — Assessment & Plan Note (Signed)
Well-controlled with OTC antacid for now Avoid hot and spicy food

## 2022-07-22 DIAGNOSIS — E559 Vitamin D deficiency, unspecified: Secondary | ICD-10-CM | POA: Insufficient documentation

## 2022-07-22 LAB — CBC WITH DIFFERENTIAL/PLATELET
Basophils Absolute: 0.1 10*3/uL (ref 0.0–0.2)
Basos: 1 %
EOS (ABSOLUTE): 0.2 10*3/uL (ref 0.0–0.4)
Eos: 3 %
Hematocrit: 41 % (ref 34.0–46.6)
Hemoglobin: 13.1 g/dL (ref 11.1–15.9)
Immature Grans (Abs): 0 10*3/uL (ref 0.0–0.1)
Immature Granulocytes: 0 %
Lymphocytes Absolute: 3.1 10*3/uL (ref 0.7–3.1)
Lymphs: 40 %
MCH: 26.4 pg — ABNORMAL LOW (ref 26.6–33.0)
MCHC: 32 g/dL (ref 31.5–35.7)
MCV: 83 fL (ref 79–97)
Monocytes Absolute: 0.4 10*3/uL (ref 0.1–0.9)
Monocytes: 5 %
Neutrophils Absolute: 4.1 10*3/uL (ref 1.4–7.0)
Neutrophils: 51 %
Platelets: 363 10*3/uL (ref 150–450)
RBC: 4.96 x10E6/uL (ref 3.77–5.28)
RDW: 15.3 % (ref 11.7–15.4)
WBC: 7.9 10*3/uL (ref 3.4–10.8)

## 2022-07-22 LAB — CMP14+EGFR
ALT: 7 IU/L (ref 0–32)
AST: 11 IU/L (ref 0–40)
Albumin/Globulin Ratio: 1.8 (ref 1.2–2.2)
Albumin: 4.2 g/dL (ref 3.9–4.9)
Alkaline Phosphatase: 74 IU/L (ref 44–121)
BUN/Creatinine Ratio: 12 (ref 12–28)
BUN: 9 mg/dL (ref 8–27)
Bilirubin Total: 0.5 mg/dL (ref 0.0–1.2)
CO2: 25 mmol/L (ref 20–29)
Calcium: 9.4 mg/dL (ref 8.7–10.3)
Chloride: 104 mmol/L (ref 96–106)
Creatinine, Ser: 0.78 mg/dL (ref 0.57–1.00)
Globulin, Total: 2.4 g/dL (ref 1.5–4.5)
Glucose: 100 mg/dL — ABNORMAL HIGH (ref 70–99)
Potassium: 4.2 mmol/L (ref 3.5–5.2)
Sodium: 141 mmol/L (ref 134–144)
Total Protein: 6.6 g/dL (ref 6.0–8.5)
eGFR: 85 mL/min/{1.73_m2} (ref >59)

## 2022-07-22 LAB — LIPID PANEL
Chol/HDL Ratio: 2.4 ratio (ref 0.0–4.4)
Cholesterol, Total: 173 mg/dL (ref 100–199)
HDL: 72 mg/dL (ref >39)
LDL Chol Calc (NIH): 86 mg/dL (ref 0–99)
Triglycerides: 80 mg/dL (ref 0–149)
VLDL Cholesterol Cal: 15 mg/dL (ref 5–40)

## 2022-07-22 LAB — TSH: TSH: 1.18 u[IU]/mL (ref 0.450–4.500)

## 2022-07-22 LAB — VITAMIN D 25 HYDROXY (VIT D DEFICIENCY, FRACTURES): Vit D, 25-Hydroxy: 11.9 ng/mL — ABNORMAL LOW (ref 30.0–100.0)

## 2022-07-22 LAB — HEMOGLOBIN A1C
Est. average glucose Bld gHb Est-mCnc: 143 mg/dL
Hgb A1c MFr Bld: 6.6 % — ABNORMAL HIGH (ref 4.8–5.6)

## 2022-07-22 MED ORDER — VITAMIN D (ERGOCALCIFEROL) 1.25 MG (50000 UNIT) PO CAPS: 50000.0000 [IU] | ORAL_CAPSULE | ORAL | 1 refills | Status: DC

## 2022-07-22 NOTE — Addendum Note (Signed)
Addended byIhor Dow on: 07/22/2022 07:46 AM   Modules accepted: Orders

## 2022-07-31 ENCOUNTER — Encounter: Payer: Self-pay | Admitting: *Deleted

## 2022-07-31 DIAGNOSIS — Z90710 Acquired absence of both cervix and uterus: Secondary | ICD-10-CM | POA: Insufficient documentation

## 2022-08-18 LAB — HM DIABETES EYE EXAM

## 2022-08-25 ENCOUNTER — Telehealth: Payer: Self-pay

## 2022-08-25 NOTE — Telephone Encounter (Signed)
Danielle Hicks called from [place] about CT Scan on lung on Thursday, 10/192023 and needs prior authorization for her insurance [employer]. Danielle Hicks call back # [phone] ext [phone].

## 2022-08-26 NOTE — Telephone Encounter (Signed)
Danielle Hicks pre service [facility] called back in regard to last tele , Pre Josem Kaufmann is not required

## 2022-08-26 NOTE — Telephone Encounter (Signed)
Noted  

## 2022-08-28 ENCOUNTER — Ambulatory Visit (HOSPITAL_COMMUNITY)
Admission: RE | Admit: 2022-08-28 | Discharge: 2022-08-28 | Disposition: A | Discharge status: Home / Self Care | Payer: BC Managed Care – PPO | Source: Ambulatory Visit | Attending: Internal Medicine | Admitting: Internal Medicine

## 2022-08-28 DIAGNOSIS — Z122 Encounter for screening for malignant neoplasm of respiratory organs: Secondary | ICD-10-CM | POA: Diagnosis present

## 2022-08-28 DIAGNOSIS — Z720 Tobacco use: Secondary | ICD-10-CM | POA: Diagnosis present

## 2022-08-29 ENCOUNTER — Encounter: Payer: Self-pay | Admitting: *Deleted

## 2022-08-30 ENCOUNTER — Other Ambulatory Visit: Payer: Self-pay | Admitting: Family Medicine

## 2022-08-30 DIAGNOSIS — E119 Type 2 diabetes mellitus without complications: Secondary | ICD-10-CM

## 2022-09-20 ENCOUNTER — Encounter (INDEPENDENT_AMBULATORY_CARE_PROVIDER_SITE_OTHER): Payer: Self-pay | Admitting: Gastroenterology

## 2022-10-01 ENCOUNTER — Other Ambulatory Visit: Payer: Self-pay | Admitting: Internal Medicine

## 2022-10-01 DIAGNOSIS — E7800 Pure hypercholesterolemia, unspecified: Secondary | ICD-10-CM

## 2022-10-23 ENCOUNTER — Other Ambulatory Visit: Payer: Self-pay | Admitting: Internal Medicine

## 2022-10-23 DIAGNOSIS — E119 Type 2 diabetes mellitus without complications: Secondary | ICD-10-CM

## 2022-11-16 ENCOUNTER — Other Ambulatory Visit: Payer: Self-pay | Admitting: Internal Medicine

## 2022-11-16 DIAGNOSIS — E119 Type 2 diabetes mellitus without complications: Secondary | ICD-10-CM

## 2022-11-27 ENCOUNTER — Other Ambulatory Visit: Payer: Self-pay | Admitting: Internal Medicine

## 2022-11-27 DIAGNOSIS — E119 Type 2 diabetes mellitus without complications: Secondary | ICD-10-CM

## 2022-12-25 LAB — HM MAMMOGRAPHY

## 2023-01-08 ENCOUNTER — Encounter: Payer: Self-pay | Admitting: Radiology

## 2023-01-14 ENCOUNTER — Telehealth: Payer: Self-pay | Admitting: Orthopedic Surgery

## 2023-01-14 MED ORDER — CLINDAMYCIN HCL 150 MG PO CAPS: 600.0000 mg | ORAL_CAPSULE | Freq: Once | ORAL | 2 refills | Status: AC

## 2023-01-14 NOTE — Telephone Encounter (Signed)
Patient has a dentist appt tomorrow at 10:00 and is requesting a antibiotic for her appt.  She wants to know if she has to keep taking a antibiotic.  Please call her back and advise [phone]

## 2023-01-14 NOTE — Telephone Encounter (Signed)
I called her to advise. Mail box is full/ goodbye is recording I got.

## 2023-01-14 NOTE — Telephone Encounter (Signed)
Sent in per protocol Yes abx before dental work, due to knee replacement

## 2023-01-17 ENCOUNTER — Other Ambulatory Visit: Payer: Self-pay | Admitting: Internal Medicine

## 2023-01-17 DIAGNOSIS — E559 Vitamin D deficiency, unspecified: Secondary | ICD-10-CM

## 2023-01-21 ENCOUNTER — Ambulatory Visit: Payer: BC Managed Care – PPO | Admitting: Internal Medicine

## 2023-01-22 ENCOUNTER — Other Ambulatory Visit: Payer: Self-pay | Admitting: Internal Medicine

## 2023-01-22 DIAGNOSIS — E119 Type 2 diabetes mellitus without complications: Secondary | ICD-10-CM

## 2023-01-28 ENCOUNTER — Encounter: Payer: Self-pay | Admitting: Internal Medicine

## 2023-01-28 ENCOUNTER — Ambulatory Visit: Payer: BC Managed Care – PPO | Admitting: Internal Medicine

## 2023-01-28 VITALS — BP 135/84 | HR 79 | Ht 61.0 in | Wt 146.2 lb

## 2023-01-28 DIAGNOSIS — J432 Centrilobular emphysema: Secondary | ICD-10-CM

## 2023-01-28 DIAGNOSIS — Z720 Tobacco use: Secondary | ICD-10-CM

## 2023-01-28 DIAGNOSIS — E1169 Type 2 diabetes mellitus with other specified complication: Secondary | ICD-10-CM | POA: Diagnosis not present

## 2023-01-28 DIAGNOSIS — E559 Vitamin D deficiency, unspecified: Secondary | ICD-10-CM

## 2023-01-28 DIAGNOSIS — R911 Solitary pulmonary nodule: Secondary | ICD-10-CM

## 2023-01-28 DIAGNOSIS — I10 Essential (primary) hypertension: Secondary | ICD-10-CM

## 2023-01-28 DIAGNOSIS — E782 Mixed hyperlipidemia: Secondary | ICD-10-CM | POA: Diagnosis not present

## 2023-01-28 DIAGNOSIS — K219 Gastro-esophageal reflux disease without esophagitis: Secondary | ICD-10-CM

## 2023-01-28 LAB — POCT GLYCOSYLATED HEMOGLOBIN (HGB A1C): HbA1c, POC (controlled diabetic range): 6.5 % (ref 0.0–7.0)

## 2023-01-28 MED ORDER — OMEPRAZOLE 40 MG PO CPDR: 40.0000 mg | DELAYED_RELEASE_CAPSULE | Freq: Every day | ORAL | 5 refills | Status: DC

## 2023-01-28 MED ORDER — NICOTINE 14 MG/24HR TD PT24: 14.0000 mg | MEDICATED_PATCH | Freq: Every day | TRANSDERMAL | 2 refills | Status: DC

## 2023-01-28 MED ORDER — DAPAGLIFLOZIN PROPANEDIOL 10 MG PO TABS: 10.0000 mg | ORAL_TABLET | Freq: Every day | ORAL | 11 refills | Status: DC

## 2023-01-28 NOTE — Assessment & Plan Note (Signed)
Smokes 0.5 pack/day  Asked about quitting: confirms that she currently smokes cigarettes Advise to quit smoking: Educated about QUITTING to reduce the risk of cancer, cardio and cerebrovascular disease. Assess willingness: Unwilling to quit at this time, but is working on cutting back. Assist with counseling and pharmacotherapy: Counseled for 5 minutes and literature provided. Agrees to try Nicotine patch. Arrange for follow up: Follow up in 3 months and continue to offer help.

## 2023-01-28 NOTE — Patient Instructions (Signed)
Please continue to take medications as prescribed.  Please continue to follow low carb diet and perform moderate exercise/walking at least 150 mins/week.  Please use Nicotine patch to help you with smoking cessation.  Please get fasting blood tests done before the next visit.

## 2023-01-28 NOTE — Assessment & Plan Note (Signed)
Lab Results  Component Value Date   HGBA1C 6.5 01/28/2023   Well-controlled With HTN, HLD and GERD On Metformin and Farxiga Advised to follow diabetic diet On statin and ACEi Diabetic eye exam: Advised to follow up with Ophthalmology for diabetic eye exam

## 2023-01-28 NOTE — Assessment & Plan Note (Signed)
Noted on CT chest Currently denies any dyspnea, wheezing, hemoptysis, night sweats, weight loss or LAD Needs repeat CT chest for acute close follow up

## 2023-01-28 NOTE — Assessment & Plan Note (Signed)
Well-controlled with omeprazole, advised to take it as needed  Avoid hot and spicy food

## 2023-01-28 NOTE — Assessment & Plan Note (Signed)
Noted on CT chest Currently asymptomatic Needs to cut down -> quit smoking

## 2023-01-28 NOTE — Assessment & Plan Note (Signed)
On statin Check lipid profile 

## 2023-01-28 NOTE — Progress Notes (Signed)
Established Patient Office Visit  Subjective:  Patient ID: Danielle Hicks, female    DOB: May 20, 1956  Age: 67 y.o. MRN: [id number]  CC:  Chief Complaint  Patient presents with   Diabetes    Six month follow up for diabetes and hypertension     HPI Emmanuel E Lintz is a 67 y.o. female with past medical history of HTN, type 2 DM, GERD and tobacco abuse who presents for f/u of her chronic medical conditions.  DM: Her HbA1C was 6.5 in the office today, which was better compared to prior. She has been taking Metformin and Farxiga regularly. She checks her blood glucose regularly, ranges around 100-120. She states that she needs to improve her diet still, but has tried to cut down on pancakes and soda.  HTN: BP is well-controlled. Takes medications regularly. Patient denies headache, dizziness, chest pain, dyspnea or palpitations.  GERD: She was prescribed omeprazole 40 mg, which has improved her heartburn and bloating.  She requests refill of omeprazole.  She currently denies any dysphagia or odynophagia.   Past Medical History:  Diagnosis Date   Arthritis    GERD (gastroesophageal reflux disease)    Headache    Hypertension    Type 2 diabetes mellitus ([place])     Past Surgical History:  Procedure Laterality Date   ABDOMINAL HYSTERECTOMY     BLEPHAROPLASTY Bilateral    COLONOSCOPY N/A 06/17/2018   Procedure: COLONOSCOPY;  Surgeon: Rogene Houston, MD;  [facility];  Service: Endoscopy;  Laterality: N/A;  12:25   HEMORRHOID SURGERY     POLYPECTOMY  06/17/2018   Procedure: POLYPECTOMY;  Surgeon: Rogene Houston, MD;  [facility];  Service: Endoscopy;;  colon   TOTAL KNEE ARTHROPLASTY Left 01/26/2018   Procedure: TOTAL KNEE ARTHROPLASTY;  Surgeon: Carole Civil, MD;  Location: [facility];  Service: Orthopedics;  Laterality: Left;    Family History  Problem Relation Age of Onset   Diabetes Mother    Hypertension Mother    Stroke Sister     Social  History   Socioeconomic History   Marital status: Married    Spouse name: Not on file   Number of children: Not on file   Years of education: Not on file   Highest education level: Not on file  Occupational History   Not on file  Tobacco Use   Smoking status: Some Days    Types: Cigarettes   Smokeless tobacco: Never   Tobacco comments:    trying to quit   Vaping Use   Vaping Use: Never used  Substance and Sexual Activity   Alcohol use: No    Alcohol/week: 0.0 standard drinks of alcohol   Drug use: No   Sexual activity: Yes  Other Topics Concern   Not on file  Social History Narrative   Not on file   Social Determinants of Health   Financial Resource Strain: Not on file  Food Insecurity: Not on file  Transportation Needs: Not on file  Physical Activity: Not on file  Stress: Not on file  Social Connections: Not on file  Intimate Partner Violence: Not on file    Outpatient Medications Prior to Visit  Medication Sig Dispense Refill   amLODipine (NORVASC) 10 MG tablet TAKE 1 TABLET BY MOUTH EVERY DAY 90 tablet 1   Blood Glucose Monitoring Suppl (BLOOD GLUCOSE SYSTEM PAK) KIT Please dispense based on patient and insurance preference. Use as directed to monitor FSBS 1x daily.  Dx: E11.9 1 each 1   hydrOXYzine (ATARAX/VISTARIL) 10 MG tablet Take 1 tablet (10 mg total) by mouth 3 (three) times daily as needed. 30 tablet 0   lisinopril-hydrochlorothiazide (ZESTORETIC) 10-12.5 MG tablet TAKE 1 TABLET BT MOUTH DAILY 90 tablet 3   metFORMIN (GLUCOPHAGE-XR) 500 MG 24 hr tablet TAKE 1 TABLET BY MOUTH EVERY DAY WITH BREAKFAST 90 tablet 0   Multiple Vitamin (MULTIVITAMIN WITH MINERALS) TABS tablet Take 1 tablet by mouth 4 (four) times a week.     potassium chloride (KLOR-CON) 10 MEQ tablet TAKE 1 TABLET(10 MEQ) BY MOUTH DAILY 90 tablet 3   pravastatin (PRAVACHOL) 20 MG tablet TAKE 1 TABLET BY MOUTH EVERY DAY 90 tablet 1   Vitamin D, Ergocalciferol, (DRISDOL) 1.25 MG (50000 UNIT) CAPS  capsule TAKE 1 CAPSULE (50,000 UNITS TOTAL) BY MOUTH EVERY 7 (SEVEN) DAYS 12 capsule 1   FARXIGA 10 MG TABS tablet TAKE 1 TABLET BY MOUTH EVERY DAY 30 tablet 2   No facility-administered medications prior to visit.    Allergies  Allergen Reactions   Penicillins Rash     Has patient had a PCN reaction causing immediate rash, facial/tongue/throat swelling, SOB or lightheadedness with hypotension: Yes Has patient had a PCN reaction causing severe rash involving mucus membranes or skin necrosis: No Has patient had a PCN reaction that required hospitalization: No Has patient had a PCN reaction occurring within the last 10 years: No If all of the above answers are "NO", then may proceed with Cephalosporin use.     ROS Review of Systems  Constitutional:  Negative for chills and fever.  HENT:  Negative for congestion, sinus pressure, sinus pain and sore throat.   Eyes:  Negative for pain and discharge.  Respiratory:  Negative for cough and shortness of breath.   Cardiovascular:  Negative for chest pain and palpitations.  Gastrointestinal:  Negative for abdominal pain, constipation, nausea and vomiting.  Endocrine: Negative for polydipsia and polyuria.  Genitourinary:  Negative for dysuria and hematuria.  Musculoskeletal:  Positive for arthralgias. Negative for neck pain and neck stiffness.  Skin:  Negative for rash.  Neurological:  Negative for dizziness and weakness.  Psychiatric/Behavioral:  Negative for agitation and behavioral problems.       Objective:    Physical Exam Vitals reviewed.  Constitutional:      General: She is not in acute distress.    Appearance: She is not diaphoretic.  HENT:     Head: Normocephalic and atraumatic.     Nose: Nose normal.     Mouth/Throat:     Mouth: Mucous membranes are moist.  Eyes:     General: No scleral icterus.    Extraocular Movements: Extraocular movements intact.  Neck:     Vascular: No carotid bruit.  Cardiovascular:     Rate  and Rhythm: Normal rate and regular rhythm.     Pulses: Normal pulses.     Heart sounds: Normal heart sounds. No murmur heard. Pulmonary:     Breath sounds: Normal breath sounds. No wheezing or rales.  Abdominal:     Palpations: Abdomen is soft.     Tenderness: There is no abdominal tenderness.  Musculoskeletal:     Cervical back: Neck supple. No tenderness.     Right lower leg: No edema.     Left lower leg: No edema.  Skin:    General: Skin is warm.     Findings: No rash.  Neurological:     General: No focal deficit present.  Mental Status: She is alert and oriented to person, place, and time.     Sensory: No sensory deficit.     Motor: No weakness.  Psychiatric:        Mood and Affect: Mood normal.        Behavior: Behavior normal.     BP 135/84 (BP Location: Right Arm, Patient Position: Sitting, Cuff Size: Normal)   Pulse 79   Ht 5\' 1"  (1.549 m)   Wt 146 lb 3.2 oz (66.3 kg)   SpO2 97%   BMI 27.62 kg/m  Wt Readings from Last 3 Encounters:  01/28/23 146 lb 3.2 oz (66.3 kg)  07/21/22 152 lb 12.8 oz (69.3 kg)  02/27/22 155 lb 6.4 oz (70.5 kg)    Lab Results  Component Value Date   TSH 1.180 07/21/2022   Lab Results  Component Value Date   WBC 7.9 07/21/2022   HGB 13.1 07/21/2022   HCT 41.0 07/21/2022   MCV 83 07/21/2022   PLT 363 07/21/2022   Lab Results  Component Value Date   NA 141 07/21/2022   K 4.2 07/21/2022   CO2 25 07/21/2022   GLUCOSE 100 (H) 07/21/2022   BUN 9 07/21/2022   CREATININE 0.78 07/21/2022   BILITOT 0.5 07/21/2022   ALKPHOS 74 07/21/2022   AST 11 07/21/2022   ALT 7 07/21/2022   PROT 6.6 07/21/2022   ALBUMIN 4.2 07/21/2022   CALCIUM 9.4 07/21/2022   ANIONGAP 8 09/18/2018   EGFR 85 07/21/2022   Lab Results  Component Value Date   CHOL 173 07/21/2022   Lab Results  Component Value Date   HDL 72 07/21/2022   Lab Results  Component Value Date   LDLCALC 86 07/21/2022   Lab Results  Component Value Date   TRIG 80  07/21/2022   Lab Results  Component Value Date   CHOLHDL 2.4 07/21/2022   Lab Results  Component Value Date   HGBA1C 6.5 01/28/2023      Assessment & Plan:   Problem List Items Addressed This Visit       Cardiovascular and Mediastinum   Essential hypertension, benign     BP Readings from Last 1 Encounters:  01/28/23 135/84  Well-controlled with Amlodipine and Zestoretic Counseled for compliance with the medications Advised DASH diet and moderate exercise/walking, at least 150 mins/week      Relevant Orders   TSH   CBC with Differential/Platelet     Respiratory   Pulmonary nodule    Noted on CT chest Currently denies any dyspnea, wheezing, hemoptysis, night sweats, weight loss or LAD Needs repeat CT chest for acute close follow up      Relevant Orders   CT Chest Wo Contrast   Centrilobular emphysema ([place])    Noted on CT chest Currently asymptomatic Needs to cut down -> quit smoking      Relevant Medications   nicotine (NICODERM CQ - DOSED IN MG/24 HOURS) 14 mg/24hr patch     Digestive   GERD (gastroesophageal reflux disease)    Well-controlled with omeprazole, advised to take it as needed  Avoid hot and spicy food      Relevant Medications   omeprazole (PRILOSEC) 40 MG capsule   Other Relevant Orders   CBC with Differential/Platelet     Endocrine   DM (diabetes mellitus) ([place]) - Primary    Lab Results  Component Value Date   HGBA1C 6.5 01/28/2023  Well-controlled With HTN, HLD and GERD On Metformin and Farxiga Advised  to follow diabetic diet On statin and ACEi Diabetic eye exam: Advised to follow up with Ophthalmology for diabetic eye exam      Relevant Medications   dapagliflozin propanediol (FARXIGA) 10 MG TABS tablet   Other Relevant Orders   Urine Microalbumin w/creat. ratio   Hemoglobin A1c   CMP14+EGFR   CBC with Differential/Platelet   POCT glycosylated hemoglobin (Hb A1C) (Completed)     Other   Tobacco abuse    Smokes 0.5  pack/day  Asked about quitting: confirms that she currently smokes cigarettes Advise to quit smoking: Educated about QUITTING to reduce the risk of cancer, cardio and cerebrovascular disease. Assess willingness: Unwilling to quit at this time, but is working on cutting back. Assist with counseling and pharmacotherapy: Counseled for 5 minutes and literature provided. Agrees to try Nicotine patch. Arrange for follow up: Follow up in 3 months and continue to offer help.      Relevant Medications   nicotine (NICODERM CQ - DOSED IN MG/24 HOURS) 14 mg/24hr patch   HLD (hyperlipidemia)    On statin Check lipid profile      Relevant Orders   Lipid panel   Vitamin D deficiency   Relevant Orders   VITAMIN D 25 Hydroxy (Vit-D Deficiency, Fractures)   Meds ordered this encounter  Medications   dapagliflozin propanediol (FARXIGA) 10 MG TABS tablet    Sig: Take 1 tablet (10 mg total) by mouth daily.    Dispense:  30 tablet    Refill:  11   omeprazole (PRILOSEC) 40 MG capsule    Sig: Take 1 capsule (40 mg total) by mouth daily.    Dispense:  30 capsule    Refill:  5   nicotine (NICODERM CQ - DOSED IN MG/24 HOURS) 14 mg/24hr patch    Sig: Place 1 patch (14 mg total) onto the skin daily.    Dispense:  28 patch    Refill:  2    Follow-up: Return in about 6 months (around 07/31/2023) for Annual physical.    Lindell Spar, MD

## 2023-01-28 NOTE — Assessment & Plan Note (Signed)
  BP Readings from Last 1 Encounters:  01/28/23 135/84   Well-controlled with Amlodipine and Zestoretic Counseled for compliance with the medications Advised DASH diet and moderate exercise/walking, at least 150 mins/week

## 2023-02-04 ENCOUNTER — Ambulatory Visit (HOSPITAL_COMMUNITY)
Admission: RE | Admit: 2023-02-04 | Discharge: 2023-02-04 | Disposition: A | Discharge status: Home / Self Care | Payer: BC Managed Care – PPO | Source: Ambulatory Visit | Attending: Internal Medicine | Admitting: Internal Medicine

## 2023-02-04 DIAGNOSIS — R911 Solitary pulmonary nodule: Secondary | ICD-10-CM | POA: Insufficient documentation

## 2023-03-26 ENCOUNTER — Other Ambulatory Visit: Payer: Self-pay | Admitting: Internal Medicine

## 2023-03-26 DIAGNOSIS — E7800 Pure hypercholesterolemia, unspecified: Secondary | ICD-10-CM

## 2023-04-09 ENCOUNTER — Other Ambulatory Visit: Payer: Self-pay | Admitting: Internal Medicine

## 2023-04-09 DIAGNOSIS — E119 Type 2 diabetes mellitus without complications: Secondary | ICD-10-CM

## 2023-04-19 ENCOUNTER — Other Ambulatory Visit: Payer: Self-pay | Admitting: Internal Medicine

## 2023-04-19 DIAGNOSIS — E119 Type 2 diabetes mellitus without complications: Secondary | ICD-10-CM

## 2023-05-30 ENCOUNTER — Other Ambulatory Visit: Payer: Self-pay | Admitting: Internal Medicine

## 2023-05-30 DIAGNOSIS — E559 Vitamin D deficiency, unspecified: Secondary | ICD-10-CM

## 2023-07-18 ENCOUNTER — Other Ambulatory Visit: Payer: Self-pay | Admitting: Internal Medicine

## 2023-07-19 ENCOUNTER — Other Ambulatory Visit: Payer: Self-pay | Admitting: Internal Medicine

## 2023-07-19 DIAGNOSIS — E119 Type 2 diabetes mellitus without complications: Secondary | ICD-10-CM

## 2023-07-30 ENCOUNTER — Ambulatory Visit (INDEPENDENT_AMBULATORY_CARE_PROVIDER_SITE_OTHER): Payer: Medicare Other | Admitting: Internal Medicine

## 2023-07-30 ENCOUNTER — Encounter: Payer: Self-pay | Admitting: Internal Medicine

## 2023-07-30 VITALS — BP 126/83 | HR 65 | Ht 61.0 in | Wt 144.6 lb

## 2023-07-30 DIAGNOSIS — Z23 Encounter for immunization: Secondary | ICD-10-CM

## 2023-07-30 DIAGNOSIS — E1169 Type 2 diabetes mellitus with other specified complication: Secondary | ICD-10-CM | POA: Diagnosis not present

## 2023-07-30 DIAGNOSIS — R61 Generalized hyperhidrosis: Secondary | ICD-10-CM

## 2023-07-30 DIAGNOSIS — I10 Essential (primary) hypertension: Secondary | ICD-10-CM

## 2023-07-30 DIAGNOSIS — Z780 Asymptomatic menopausal state: Secondary | ICD-10-CM

## 2023-07-30 DIAGNOSIS — E559 Vitamin D deficiency, unspecified: Secondary | ICD-10-CM

## 2023-07-30 DIAGNOSIS — Z720 Tobacco use: Secondary | ICD-10-CM

## 2023-07-30 DIAGNOSIS — Z7984 Long term (current) use of oral hypoglycemic drugs: Secondary | ICD-10-CM

## 2023-07-30 DIAGNOSIS — Z0001 Encounter for general adult medical examination with abnormal findings: Secondary | ICD-10-CM | POA: Diagnosis not present

## 2023-07-30 DIAGNOSIS — E782 Mixed hyperlipidemia: Secondary | ICD-10-CM

## 2023-07-30 DIAGNOSIS — K219 Gastro-esophageal reflux disease without esophagitis: Secondary | ICD-10-CM

## 2023-07-30 NOTE — Progress Notes (Signed)
Established Patient Office Visit  Subjective:  Patient ID: Danielle Hicks, female    DOB: 08-Nov-1956  Age: 67 y.o. MRN: [id number]  CC:  Chief Complaint  Patient presents with   Annual Exam   Night Sweats    Patient is having a lot of night sweats    HPI Danielle Hicks is a 67 y.o. female with past medical history of HTN, type 2 DM, GERD and tobacco abuse who presents for annual physical.  DM: Her HbA1C was 6.5 in 03/24, which was better compared to prior. She has been taking Metformin and Farxiga regularly. She checks her blood glucose regularly, ranges around 100-120. She states that she needs to improve her diet still, but has tried to cut down on pancakes and soda.   HTN: BP is well-controlled. Takes medications regularly. Patient denies headache, dizziness, chest pain, dyspnea or palpitations.   GERD: She used to take omeprazole 40 mg, which has improved her heartburn and bloating. She takes Pepto-Bismol PRN currently. She currently denies any dysphagia or odynophagia.  She reports night sweats recently due to sudden weather changes.  Denies any chronic cough, hemoptysis, weight loss, fever, chills, daytime sweating or LAD.    Past Medical History:  Diagnosis Date   Arthritis    GERD (gastroesophageal reflux disease)    Headache    Hypertension    Type 2 diabetes mellitus (HCC)     Past Surgical History:  Procedure Laterality Date   ABDOMINAL HYSTERECTOMY     BLEPHAROPLASTY Bilateral    COLONOSCOPY N/A 06/17/2018   Procedure: COLONOSCOPY;  Surgeon: Malissa Hippo, MD;  Location: [facility];  Service: Endoscopy;  Laterality: N/A;  12:25   HEMORRHOID SURGERY     POLYPECTOMY  06/17/2018   Procedure: POLYPECTOMY;  Surgeon: Malissa Hippo, MD;  Location: [facility];  Service: Endoscopy;;  colon   TOTAL KNEE ARTHROPLASTY Left 01/26/2018   Procedure: TOTAL KNEE ARTHROPLASTY;  Surgeon: Vickki Hearing, MD;  Location: [facility];  Service: Orthopedics;   Laterality: Left;    Family History  Problem Relation Age of Onset   Diabetes Mother    Hypertension Mother    Stroke Sister     Social History   Socioeconomic History   Marital status: Married    Spouse name: Not on file   Number of children: Not on file   Years of education: Not on file   Highest education level: Not on file  Occupational History   Not on file  Tobacco Use   Smoking status: Some Days    Types: Cigarettes   Smokeless tobacco: Never   Tobacco comments:    trying to quit   Vaping Use   Vaping status: Never Used  Substance and Sexual Activity   Alcohol use: No    Alcohol/week: 0.0 standard drinks of alcohol   Drug use: No   Sexual activity: Yes  Other Topics Concern   Not on file  Social History Narrative   Not on file   Social Determinants of Health   Financial Resource Strain: Not on file  Food Insecurity: Not on file  Transportation Needs: Not on file  Physical Activity: Not on file  Stress: Not on file  Social Connections: Not on file  Intimate Partner Violence: Not on file    Outpatient Medications Prior to Visit  Medication Sig Dispense Refill   amLODipine (NORVASC) 10 MG tablet TAKE 1 TABLET BY MOUTH EVERY DAY 90 tablet 2  Blood Glucose Monitoring Suppl (BLOOD GLUCOSE SYSTEM PAK) KIT Please dispense based on patient and insurance preference. Use as directed to monitor FSBS 1x daily. Dx: E11.9 1 each 1   dapagliflozin propanediol (FARXIGA) 10 MG TABS tablet Take 1 tablet (10 mg total) by mouth daily. 30 tablet 11   hydrOXYzine (ATARAX/VISTARIL) 10 MG tablet Take 1 tablet (10 mg total) by mouth 3 (three) times daily as needed. 30 tablet 0   lisinopril-hydrochlorothiazide (ZESTORETIC) 10-12.5 MG tablet TAKE 1 TABLET BT MOUTH DAILY 90 tablet 3   metFORMIN (GLUCOPHAGE-XR) 500 MG 24 hr tablet TAKE 1 TABLET BY MOUTH EVERY DAY WITH BREAKFAST 90 tablet 0   Multiple Vitamin (MULTIVITAMIN WITH MINERALS) TABS tablet Take 1 tablet by mouth 4 (four)  times a week.     omeprazole (PRILOSEC) 40 MG capsule Take 1 capsule (40 mg total) by mouth daily. 30 capsule 5   potassium chloride (KLOR-CON) 10 MEQ tablet TAKE 1 TABLET(10 MEQ) BY MOUTH DAILY 90 tablet 3   pravastatin (PRAVACHOL) 20 MG tablet TAKE 1 TABLET BY MOUTH EVERY DAY 90 tablet 1   Vitamin D, Ergocalciferol, (DRISDOL) 1.25 MG (50000 UNIT) CAPS capsule TAKE 1 CAPSULE (50,000 UNITS TOTAL) BY MOUTH EVERY 7 (SEVEN) DAYS 12 capsule 1   nicotine (NICODERM CQ - DOSED IN MG/24 HOURS) 14 mg/24hr patch Place 1 patch (14 mg total) onto the skin daily. 28 patch 2   No facility-administered medications prior to visit.    Allergies  Allergen Reactions   Penicillins Rash     Has patient had a PCN reaction causing immediate rash, facial/tongue/throat swelling, SOB or lightheadedness with hypotension: Yes Has patient had a PCN reaction causing severe rash involving mucus membranes or skin necrosis: No Has patient had a PCN reaction that required hospitalization: No Has patient had a PCN reaction occurring within the last 10 years: No If all of the above answers are "NO", then may proceed with Cephalosporin use.     ROS Review of Systems  Constitutional:  Negative for chills and fever.  HENT:  Negative for congestion, sinus pressure, sinus pain and sore throat.   Eyes:  Negative for pain and discharge.  Respiratory:  Negative for cough and shortness of breath.   Cardiovascular:  Negative for chest pain and palpitations.  Gastrointestinal:  Negative for abdominal pain, constipation, nausea and vomiting.  Endocrine: Negative for polydipsia and polyuria.  Genitourinary:  Negative for dysuria and hematuria.  Musculoskeletal:  Positive for arthralgias. Negative for neck pain and neck stiffness.  Skin:  Negative for rash.  Neurological:  Negative for dizziness and weakness.  Psychiatric/Behavioral:  Negative for agitation and behavioral problems.       Objective:    Physical Exam Vitals  reviewed.  Constitutional:      General: She is not in acute distress.    Appearance: She is not diaphoretic.  HENT:     Head: Normocephalic and atraumatic.     Nose: Nose normal.     Mouth/Throat:     Mouth: Mucous membranes are moist.  Eyes:     General: No scleral icterus.    Extraocular Movements: Extraocular movements intact.  Neck:     Vascular: No carotid bruit.  Cardiovascular:     Rate and Rhythm: Normal rate and regular rhythm.     Pulses: Normal pulses.     Heart sounds: Normal heart sounds. No murmur heard. Pulmonary:     Breath sounds: Normal breath sounds. No wheezing or rales.  Abdominal:  Palpations: Abdomen is soft.     Tenderness: There is no abdominal tenderness.  Musculoskeletal:     Cervical back: Neck supple. No tenderness.     Right lower leg: No edema.     Left lower leg: No edema.  Skin:    General: Skin is warm.     Findings: No rash.  Neurological:     General: No focal deficit present.     Mental Status: She is alert and oriented to person, place, and time.     Cranial Nerves: No cranial nerve deficit.     Sensory: No sensory deficit.     Motor: No weakness.  Psychiatric:        Mood and Affect: Mood normal.        Behavior: Behavior normal.     BP 126/83 (BP Location: Right Arm, Patient Position: Sitting, Cuff Size: Normal)   Pulse 65   Ht 5\' 1"  (1.549 m)   Wt 144 lb 9.6 oz (65.6 kg)   SpO2 98%   BMI 27.32 kg/m  Wt Readings from Last 3 Encounters:  07/30/23 144 lb 9.6 oz (65.6 kg)  01/28/23 146 lb 3.2 oz (66.3 kg)  07/21/22 152 lb 12.8 oz (69.3 kg)    Lab Results  Component Value Date   TSH 1.180 07/21/2022   Lab Results  Component Value Date   WBC 7.9 07/21/2022   HGB 13.1 07/21/2022   HCT 41.0 07/21/2022   MCV 83 07/21/2022   PLT 363 07/21/2022   Lab Results  Component Value Date   NA 141 07/21/2022   K 4.2 07/21/2022   CO2 25 07/21/2022   GLUCOSE 100 (H) 07/21/2022   BUN 9 07/21/2022   CREATININE 0.78  07/21/2022   BILITOT 0.5 07/21/2022   ALKPHOS 74 07/21/2022   AST 11 07/21/2022   ALT 7 07/21/2022   PROT 6.6 07/21/2022   ALBUMIN 4.2 07/21/2022   CALCIUM 9.4 07/21/2022   ANIONGAP 8 09/18/2018   EGFR 85 07/21/2022   Lab Results  Component Value Date   CHOL 173 07/21/2022   Lab Results  Component Value Date   HDL 72 07/21/2022   Lab Results  Component Value Date   LDLCALC 86 07/21/2022   Lab Results  Component Value Date   TRIG 80 07/21/2022   Lab Results  Component Value Date   CHOLHDL 2.4 07/21/2022   Lab Results  Component Value Date   HGBA1C 6.5 01/28/2023      Assessment & Plan:   Problem List Items Addressed This Visit       Cardiovascular and Mediastinum   Essential hypertension, benign     BP Readings from Last 1 Encounters:  07/30/23 126/83   Well-controlled with Amlodipine and Zestoretic Counseled for compliance with the medications Advised DASH diet and moderate exercise/walking, at least 150 mins/week      Relevant Orders   TSH   CMP14+EGFR   CBC with Differential/Platelet     Digestive   GERD (gastroesophageal reflux disease)    Well-controlled, advised to take omeprazole as needed Avoid hot and spicy food        Endocrine   DM (diabetes mellitus) (HCC)    Lab Results  Component Value Date   HGBA1C 6.5 01/28/2023   Well-controlled Associated with HTN, HLD and GERD On Metformin and Farxiga Advised to follow diabetic diet On statin and ACEi Diabetic eye exam: Advised to follow up with Ophthalmology for diabetic eye exam  Relevant Orders   Hemoglobin A1c   CMP14+EGFR   Urine Microalbumin w/creat. ratio     Other   Tobacco abuse    Smokes 0.5 pack/day  Asked about quitting: confirms that she currently smokes cigarettes Advise to quit smoking: Educated about QUITTING to reduce the risk of cancer, cardio and cerebrovascular disease. Assess willingness: Unwilling to quit at this time, but is working on cutting  back. Assist with counseling and pharmacotherapy: Counseled for 5 minutes and literature provided. Arrange for follow up: Follow up in 3 months and continue to offer help.      Encounter for general adult medical examination with abnormal findings - Primary    Physical exam as documented. Fasting blood tests ordered. Flu vaccine today.      HLD (hyperlipidemia)    On statin Checked lipid profile      Relevant Orders   Lipid panel   Vitamin D deficiency    Last vitamin D Lab Results  Component Value Date   VD25OH 11.9 (L) 07/21/2022   On Vitamin D 50,000 IU qw      Relevant Orders   VITAMIN D 25 Hydroxy (Vit-D Deficiency, Fractures)   Night sweats    Likely due to recent weather change No other B symptoms      Other Visit Diagnoses     Encounter for immunization       Relevant Orders   Flu Vaccine Trivalent High Dose (Fluad) (Completed)   Postmenopausal       Relevant Orders   DG Bone Density       No orders of the defined types were placed in this encounter.   Follow-up: Return in about 6 months (around 01/27/2024) for HTN and DM.    Anabel Halon, MD

## 2023-07-30 NOTE — Assessment & Plan Note (Addendum)
Lab Results  Component Value Date   HGBA1C 6.5 01/28/2023   Well-controlled Associated with HTN, HLD and GERD On Metformin and Farxiga Advised to follow diabetic diet On statin and ACEi Diabetic eye exam: Advised to follow up with Ophthalmology for diabetic eye exam

## 2023-07-30 NOTE — Assessment & Plan Note (Signed)
Likely due to recent weather change No other B symptoms

## 2023-07-30 NOTE — Assessment & Plan Note (Signed)
  BP Readings from Last 1 Encounters:  07/30/23 126/83   Well-controlled with Amlodipine and Zestoretic Counseled for compliance with the medications Advised DASH diet and moderate exercise/walking, at least 150 mins/week

## 2023-07-30 NOTE — Assessment & Plan Note (Signed)
Last vitamin D Lab Results  Component Value Date   VD25OH 11.9 (L) 07/21/2022   On Vitamin D 50,000 IU qw

## 2023-07-30 NOTE — Assessment & Plan Note (Signed)
On statin Checked lipid profile

## 2023-07-30 NOTE — Assessment & Plan Note (Signed)
Smokes 0.5 pack/day  Asked about quitting: confirms that she currently smokes cigarettes Advise to quit smoking: Educated about QUITTING to reduce the risk of cancer, cardio and cerebrovascular disease. Assess willingness: Unwilling to quit at this time, but is working on cutting back. Assist with counseling and pharmacotherapy: Counseled for 5 minutes and literature provided. Arrange for follow up: Follow up in 3 months and continue to offer help.

## 2023-07-30 NOTE — Assessment & Plan Note (Signed)
Physical exam as documented. Fasting blood tests ordered. Flu vaccine today.

## 2023-07-30 NOTE — Assessment & Plan Note (Addendum)
Well-controlled, advised to take omeprazole as needed Avoid hot and spicy food

## 2023-07-30 NOTE — Patient Instructions (Signed)
Please continue to take medications as prescribed.  Please continue to follow low carb diet and perform moderate exercise/walking at least 150 mins/week.  Please try to cut down -> quit smoking. Please contact 1-800-QUIT-NOW for smoking cessation supplies.

## 2023-07-31 ENCOUNTER — Other Ambulatory Visit: Payer: Self-pay | Admitting: Internal Medicine

## 2023-08-01 LAB — CMP14+EGFR
ALT: 11 IU/L (ref 0–32)
AST: 16 IU/L (ref 0–40)
Albumin: 4.3 g/dL (ref 3.9–4.9)
Alkaline Phosphatase: 74 IU/L (ref 44–121)
BUN/Creatinine Ratio: 12 (ref 12–28)
BUN: 9 mg/dL (ref 8–27)
Bilirubin Total: 0.4 mg/dL (ref 0.0–1.2)
CO2: 24 mmol/L (ref 20–29)
Calcium: 9.9 mg/dL (ref 8.7–10.3)
Chloride: 103 mmol/L (ref 96–106)
Creatinine, Ser: 0.75 mg/dL (ref 0.57–1.00)
Globulin, Total: 2.8 g/dL (ref 1.5–4.5)
Glucose: 99 mg/dL (ref 70–99)
Potassium: 4.9 mmol/L (ref 3.5–5.2)
Sodium: 141 mmol/L (ref 134–144)
Total Protein: 7.1 g/dL (ref 6.0–8.5)
eGFR: 88 mL/min/{1.73_m2} (ref >59)

## 2023-08-01 LAB — HEMOGLOBIN A1C
Est. average glucose Bld gHb Est-mCnc: 134 mg/dL
Hgb A1c MFr Bld: 6.3 % — ABNORMAL HIGH (ref 4.8–5.6)

## 2023-08-01 LAB — LIPID PANEL
Chol/HDL Ratio: 2.3 ratio (ref 0.0–4.4)
Cholesterol, Total: 190 mg/dL (ref 100–199)
HDL: 84 mg/dL (ref >39)
LDL Chol Calc (NIH): 90 mg/dL (ref 0–99)
Triglycerides: 93 mg/dL (ref 0–149)
VLDL Cholesterol Cal: 16 mg/dL (ref 5–40)

## 2023-08-01 LAB — CBC WITH DIFFERENTIAL/PLATELET
Basophils Absolute: 0 10*3/uL (ref 0.0–0.2)
Basos: 1 %
EOS (ABSOLUTE): 0.1 10*3/uL (ref 0.0–0.4)
Eos: 2 %
Hematocrit: 42 % (ref 34.0–46.6)
Hemoglobin: 13.9 g/dL (ref 11.1–15.9)
Immature Grans (Abs): 0 10*3/uL (ref 0.0–0.1)
Immature Granulocytes: 0 %
Lymphocytes Absolute: 3.2 10*3/uL — ABNORMAL HIGH (ref 0.7–3.1)
Lymphs: 41 %
MCH: 27.6 pg (ref 26.6–33.0)
MCHC: 33.1 g/dL (ref 31.5–35.7)
MCV: 83 fL (ref 79–97)
Monocytes Absolute: 0.4 10*3/uL (ref 0.1–0.9)
Monocytes: 5 %
Neutrophils Absolute: 4 10*3/uL (ref 1.4–7.0)
Neutrophils: 51 %
Platelets: 343 10*3/uL (ref 150–450)
RBC: 5.04 x10E6/uL (ref 3.77–5.28)
RDW: 15.3 % (ref 11.7–15.4)
WBC: 7.8 10*3/uL (ref 3.4–10.8)

## 2023-08-01 LAB — VITAMIN D 25 HYDROXY (VIT D DEFICIENCY, FRACTURES): Vit D, 25-Hydroxy: 69.7 ng/mL (ref 30.0–100.0)

## 2023-08-01 LAB — MICROALBUMIN / CREATININE URINE RATIO
Creatinine, Urine: 131.6 mg/dL
Microalb/Creat Ratio: 4 mg/g{creat} (ref 0–29)
Microalbumin, Urine: 5.9 ug/mL

## 2023-08-01 LAB — TSH: TSH: 1.26 u[IU]/mL (ref 0.450–4.500)

## 2023-10-16 ENCOUNTER — Other Ambulatory Visit: Payer: Self-pay | Admitting: Internal Medicine

## 2023-10-16 DIAGNOSIS — E119 Type 2 diabetes mellitus without complications: Secondary | ICD-10-CM

## 2023-11-10 ENCOUNTER — Other Ambulatory Visit: Payer: Self-pay | Admitting: Internal Medicine

## 2023-11-10 MED ORDER — ADULT MULTIVITAMIN W/MINERALS CH: 1.0000 | ORAL_TABLET | ORAL | 0 refills | Status: AC

## 2023-11-18 ENCOUNTER — Other Ambulatory Visit: Payer: Self-pay | Admitting: Internal Medicine

## 2023-11-18 DIAGNOSIS — E559 Vitamin D deficiency, unspecified: Secondary | ICD-10-CM

## 2023-11-19 NOTE — Telephone Encounter (Signed)
 Copied from CRM [id number]. Topic: Clinical - Medication Question >> Nov 18, 2023  3:11 PM Suzette B wrote: Reason for CRM:  Multiple Vitamin (MULTIVITAMIN WITH MINERALS) TABS tablet

## 2023-11-24 ENCOUNTER — Other Ambulatory Visit: Payer: Self-pay | Admitting: Internal Medicine

## 2023-11-24 DIAGNOSIS — E7800 Pure hypercholesterolemia, unspecified: Secondary | ICD-10-CM

## 2023-11-24 MED ORDER — PRAVASTATIN SODIUM 20 MG PO TABS: 20.0000 mg | ORAL_TABLET | Freq: Every day | ORAL | 1 refills | Status: DC

## 2023-11-24 NOTE — Telephone Encounter (Signed)
 Copied from CRM [id number]. Topic: Clinical - Medication Refill >> Nov 24, 2023 10:25 AM Miller H wrote: Most Recent Primary Care Visit:  Provider: TOBIE SUZZANE POUR  Department: [facility]  Visit Type: PHYSICAL  Date: 07/30/2023  Medication: pravastatin  (PRAVACHOL ) 20 MG tablet [[id number]]  Has the patient contacted their pharmacy? Yes (Agent: If no, request that the patient contact the pharmacy for the refill. If patient does not wish to contact the pharmacy document the reason why and proceed with request.) (Agent: If yes, when and what did the pharmacy advise?)  Is this the correct pharmacy for this prescription? Yes If no, delete pharmacy and type the correct one.  This is the patient's preferred pharmacy:  [id number] - [place], [place] - [address] [place] [address] Phone: [phone] Fax: [phone]   Has the prescription been filled recently? No  Is the patient out of the medication? Yes  Has the patient been seen for an appointment in the last year OR does the patient have an upcoming appointment? Yes  Can we respond through MyChart? Yes  Agent: Please be advised that Rx refills may take up to 3 business days. We ask that you follow-up with your pharmacy.

## 2023-11-24 NOTE — Telephone Encounter (Signed)
 Copied from CRM [id number]. Topic: Clinical - Medication Question >> Nov 24, 2023 10:29 AM Carlatta H wrote: Reason for CRM: Patient would like to know if she should still be taking Multiple Vitamin (MULTIVITAMIN WITH MINERALS) TABS tablet [[id number]]//Please advise

## 2024-01-01 LAB — HM MAMMOGRAPHY

## 2024-01-04 ENCOUNTER — Encounter: Payer: Self-pay | Admitting: Internal Medicine

## 2024-01-06 ENCOUNTER — Other Ambulatory Visit: Payer: Self-pay | Admitting: Internal Medicine

## 2024-01-06 DIAGNOSIS — E119 Type 2 diabetes mellitus without complications: Secondary | ICD-10-CM

## 2024-01-13 ENCOUNTER — Other Ambulatory Visit: Payer: Self-pay | Admitting: Internal Medicine

## 2024-01-13 DIAGNOSIS — E119 Type 2 diabetes mellitus without complications: Secondary | ICD-10-CM

## 2024-01-27 ENCOUNTER — Ambulatory Visit: Payer: Medicare Other | Admitting: Internal Medicine

## 2024-01-27 ENCOUNTER — Encounter: Payer: Self-pay | Admitting: Internal Medicine

## 2024-01-27 VITALS — BP 128/78 | HR 86 | Ht 61.0 in | Wt 146.2 lb

## 2024-01-27 DIAGNOSIS — J432 Centrilobular emphysema: Secondary | ICD-10-CM | POA: Diagnosis not present

## 2024-01-27 DIAGNOSIS — B351 Tinea unguium: Secondary | ICD-10-CM

## 2024-01-27 DIAGNOSIS — I10 Essential (primary) hypertension: Secondary | ICD-10-CM | POA: Diagnosis not present

## 2024-01-27 DIAGNOSIS — E782 Mixed hyperlipidemia: Secondary | ICD-10-CM

## 2024-01-27 DIAGNOSIS — E1169 Type 2 diabetes mellitus with other specified complication: Secondary | ICD-10-CM | POA: Diagnosis not present

## 2024-01-27 DIAGNOSIS — Z122 Encounter for screening for malignant neoplasm of respiratory organs: Secondary | ICD-10-CM | POA: Insufficient documentation

## 2024-01-27 DIAGNOSIS — Z7984 Long term (current) use of oral hypoglycemic drugs: Secondary | ICD-10-CM

## 2024-01-27 MED ORDER — TERBINAFINE HCL 250 MG PO TABS: 250.0000 mg | ORAL_TABLET | Freq: Every day | ORAL | 0 refills | Status: DC

## 2024-01-27 NOTE — Assessment & Plan Note (Addendum)
 Lab Results  Component Value Date   HGBA1C 6.3 (H) 07/30/2023   Well-controlled Associated with HTN, HLD and GERD On Metformin 500 mg QD and Farxiga 10 mg QD Advised to follow diabetic diet On statin and ACEi Diabetic eye exam: Advised to follow up with Ophthalmology for diabetic eye exam

## 2024-01-27 NOTE — Progress Notes (Signed)
 Established Patient Office Visit  Subjective:  Patient ID: Danielle Hicks, female    DOB: 06/09/1956  Age: 68 y.o. MRN: [id number]  CC:  Chief Complaint  Patient presents with   Diabetes   Hypertension    HPI Danielle Hicks is a 68 y.o. female with past medical history of HTN, type 2 DM, GERD and tobacco abuse who presents for f/u of her chronic medical conditions.  DM: Her HbA1C was 6.3 in 09/24, which was better compared to prior. She has been taking Metformin and Farxiga regularly. She checks her blood glucose regularly, ranges around 100-120. She states that she needs to improve her diet still, but has tried to cut down on pancakes and soda.   HTN: BP is well-controlled. Takes medications regularly. Patient denies headache, dizziness, chest pain, dyspnea or palpitations.   GERD: She used to take omeprazole 40 mg, which has improved her heartburn and bloating. She takes Pepto-Bismol PRN currently. She currently denies any dysphagia or odynophagia.  She reports night sweats recently due to sudden weather changes.  Denies any chronic cough, hemoptysis, weight loss, fever, chills, daytime sweating or LAD.    Past Medical History:  Diagnosis Date   Arthritis    GERD (gastroesophageal reflux disease)    Headache    Hypertension    Type 2 diabetes mellitus (HCC)     Past Surgical History:  Procedure Laterality Date   ABDOMINAL HYSTERECTOMY     BLEPHAROPLASTY Bilateral    COLONOSCOPY N/A 06/17/2018   Procedure: COLONOSCOPY;  Surgeon: Malissa Hippo, MD;  [facility];  Service: Endoscopy;  Laterality: N/A;  12:25   HEMORRHOID SURGERY     POLYPECTOMY  06/17/2018   Procedure: POLYPECTOMY;  Surgeon: Malissa Hippo, MD;  [facility]: [facility];  Service: Endoscopy;;  colon   TOTAL KNEE ARTHROPLASTY Left 01/26/2018   Procedure: TOTAL KNEE ARTHROPLASTY;  Surgeon: Vickki Hearing, MD;  Location: [facility];  Service: Orthopedics;  Laterality: Left;    Family  History  Problem Relation Age of Onset   Diabetes Mother    Hypertension Mother    Stroke Sister     Social History   Socioeconomic History   Marital status: Married    Spouse name: Not on file   Number of children: Not on file   Years of education: Not on file   Highest education level: Not on file  Occupational History   Not on file  Tobacco Use   Smoking status: Some Days    Current packs/day: 0.50    Average packs/day: 0.5 packs/day for 44.2 years (22.1 ttl pk-yrs)    Types: Cigarettes    Start date: 76   Smokeless tobacco: Never   Tobacco comments:    trying to quit   Vaping Use   Vaping status: Never Used  Substance and Sexual Activity   Alcohol use: No    Alcohol/week: 0.0 standard drinks of alcohol   Drug use: No   Sexual activity: Yes  Other Topics Concern   Not on file  Social History Narrative   Not on file   Social Drivers of Health   Financial Resource Strain: Not on file  Food Insecurity: Not on file  Transportation Needs: Not on file  Physical Activity: Not on file  Stress: Not on file  Social Connections: Not on file  Intimate Partner Violence: Not on file    Outpatient Medications Prior to Visit  Medication Sig Dispense Refill   amLODipine (  NORVASC) 10 MG tablet TAKE 1 TABLET BY MOUTH EVERY DAY 90 tablet 2   Blood Glucose Monitoring Suppl (BLOOD GLUCOSE SYSTEM PAK) KIT Please dispense based on patient and insurance preference. Use as directed to monitor FSBS 1x daily. Dx: E11.9 1 each 1   dapagliflozin propanediol (FARXIGA) 10 MG TABS tablet Take 1 tablet (10 mg total) by mouth daily. 30 tablet 11   hydrOXYzine (ATARAX/VISTARIL) 10 MG tablet Take 1 tablet (10 mg total) by mouth 3 (three) times daily as needed. 30 tablet 0   lisinopril-hydrochlorothiazide (ZESTORETIC) 10-12.5 MG tablet TAKE 1 TABLET BT MOUTH DAILY 90 tablet 3   metFORMIN (GLUCOPHAGE-XR) 500 MG 24 hr tablet TAKE 1 TABLET BY MOUTH EVERY DAY WITH BREAKFAST 90 tablet 0   Multiple  Vitamin (MULTIVITAMIN WITH MINERALS) TABS tablet Take 1 tablet by mouth 4 (four) times a week. 16 tablet 0   omeprazole (PRILOSEC) 40 MG capsule Take 1 capsule (40 mg total) by mouth daily. 30 capsule 5   potassium chloride (KLOR-CON) 10 MEQ tablet TAKE 1 TABLET(10 MEQ) BY MOUTH DAILY 90 tablet 3   pravastatin (PRAVACHOL) 20 MG tablet Take 1 tablet (20 mg total) by mouth daily. 90 tablet 1   No facility-administered medications prior to visit.    Allergies  Allergen Reactions   Penicillins Rash     Has patient had a PCN reaction causing immediate rash, facial/tongue/throat swelling, SOB or lightheadedness with hypotension: Yes Has patient had a PCN reaction causing severe rash involving mucus membranes or skin necrosis: No Has patient had a PCN reaction that required hospitalization: No Has patient had a PCN reaction occurring within the last 10 years: No If all of the above answers are "NO", then may proceed with Cephalosporin use.     ROS Review of Systems  Constitutional:  Negative for chills and fever.  HENT:  Negative for congestion, sinus pressure, sinus pain and sore throat.   Eyes:  Negative for pain and discharge.  Respiratory:  Negative for cough and shortness of breath.   Cardiovascular:  Negative for chest pain and palpitations.  Gastrointestinal:  Negative for abdominal pain, constipation, nausea and vomiting.  Endocrine: Negative for polydipsia and polyuria.  Genitourinary:  Negative for dysuria and hematuria.  Musculoskeletal:  Positive for arthralgias. Negative for neck pain and neck stiffness.  Skin:  Negative for rash.  Neurological:  Negative for dizziness and weakness.  Psychiatric/Behavioral:  Negative for agitation and behavioral problems.       Objective:    Physical Exam Vitals reviewed.  Constitutional:      General: She is not in acute distress.    Appearance: She is not diaphoretic.  HENT:     Head: Normocephalic and atraumatic.     Nose: Nose  normal.     Mouth/Throat:     Mouth: Mucous membranes are moist.  Eyes:     General: No scleral icterus.    Extraocular Movements: Extraocular movements intact.  Cardiovascular:     Rate and Rhythm: Normal rate and regular rhythm.     Pulses: Normal pulses.     Heart sounds: Normal heart sounds. No murmur heard. Pulmonary:     Breath sounds: Normal breath sounds. No wheezing or rales.  Musculoskeletal:     Cervical back: Neck supple. No tenderness.     Right lower leg: No edema.     Left lower leg: No edema.  Feet:     Right foot:     Toenail Condition: Fungal disease present.  Skin:    General: Skin is warm.     Findings: No rash.  Neurological:     General: No focal deficit present.     Mental Status: She is alert and oriented to person, place, and time.     Cranial Nerves: No cranial nerve deficit.     Sensory: No sensory deficit.     Motor: No weakness.  Psychiatric:        Mood and Affect: Mood normal.        Behavior: Behavior normal.     BP 128/78   Pulse 86   Ht 5\' 1"  (1.549 m)   Wt 146 lb 3.2 oz (66.3 kg)   SpO2 97%   BMI 27.62 kg/m  Wt Readings from Last 3 Encounters:  01/27/24 146 lb 3.2 oz (66.3 kg)  07/30/23 144 lb 9.6 oz (65.6 kg)  01/28/23 146 lb 3.2 oz (66.3 kg)    Lab Results  Component Value Date   TSH 1.260 07/30/2023   Lab Results  Component Value Date   WBC 7.8 07/30/2023   HGB 13.9 07/30/2023   HCT 42.0 07/30/2023   MCV 83 07/30/2023   PLT 343 07/30/2023   Lab Results  Component Value Date   NA 141 07/30/2023   K 4.9 07/30/2023   CO2 24 07/30/2023   GLUCOSE 99 07/30/2023   BUN 9 07/30/2023   CREATININE 0.75 07/30/2023   BILITOT 0.4 07/30/2023   ALKPHOS 74 07/30/2023   AST 16 07/30/2023   ALT 11 07/30/2023   PROT 7.1 07/30/2023   ALBUMIN 4.3 07/30/2023   CALCIUM 9.9 07/30/2023   ANIONGAP 8 09/18/2018   EGFR 88 07/30/2023   Lab Results  Component Value Date   CHOL 190 07/30/2023   Lab Results  Component Value Date    HDL 84 07/30/2023   Lab Results  Component Value Date   LDLCALC 90 07/30/2023   Lab Results  Component Value Date   TRIG 93 07/30/2023   Lab Results  Component Value Date   CHOLHDL 2.3 07/30/2023   Lab Results  Component Value Date   HGBA1C 6.3 (H) 07/30/2023      Assessment & Plan:   Problem List Items Addressed This Visit       Cardiovascular and Mediastinum   Essential hypertension    BP Readings from Last 1 Encounters:  01/27/24 128/78   Well-controlled with Amlodipine 10 mg QD and Zestoretic 10-12.5 mg QD Counseled for compliance with the medications Advised DASH diet and moderate exercise/walking, at least 150 mins/week        Respiratory   Centrilobular emphysema (HCC)   Noted on CT chest Currently asymptomatic Needs to cut down -> quit smoking        Endocrine   Type 2 diabetes mellitus with other specified complication (HCC) - Primary   Lab Results  Component Value Date   HGBA1C 6.3 (H) 07/30/2023   Well-controlled Associated with HTN, HLD and GERD On Metformin 500 mg QD and Farxiga 10 mg QD Advised to follow diabetic diet On statin and ACEi Diabetic eye exam: Advised to follow up with Ophthalmology for diabetic eye exam      Relevant Orders   CMP14+EGFR   Hemoglobin A1c     Musculoskeletal and Integument   Onychomycosis   Started Lamisil PO Has tried Lamisil cream If persistent, will refer to Podiatry      Relevant Medications   terbinafine (LAMISIL) 250 MG tablet     Other  HLD (hyperlipidemia)   On statin Checked lipid profile      Screening for lung cancer   Has > 20-pack-year smoking history Ordered low-dose CT chest after discussing with the patient.      Relevant Orders   Ambulatory Referral Lung Cancer Screening [facility]     Meds ordered this encounter  Medications   terbinafine (LAMISIL) 250 MG tablet    Sig: Take 1 tablet (250 mg total) by mouth daily.    Dispense:  84 tablet    Refill:  0     Follow-up: Return in about 6 months (around 07/29/2024) for Annual physical (after 07/29/24).    Anabel Halon, MD

## 2024-01-27 NOTE — Patient Instructions (Addendum)
 Please schedule Bone Density  Please Schedule Medicare Annual Wellness.  Please take Vitamin D 2000 IU once daily.  Please take Terbinafine for 12 weeks.  Please continue to take medications as prescribed.  Please continue to follow low carb diet and perform moderate exercise/walking at least 150 mins/week.

## 2024-01-27 NOTE — Assessment & Plan Note (Addendum)
  BP Readings from Last 1 Encounters:  01/27/24 128/78   Well-controlled with Amlodipine 10 mg QD and Zestoretic 10-12.5 mg QD Counseled for compliance with the medications Advised DASH diet and moderate exercise/walking, at least 150 mins/week

## 2024-01-27 NOTE — Assessment & Plan Note (Signed)
Noted on CT chest Currently asymptomatic Needs to cut down -> quit smoking

## 2024-01-27 NOTE — Assessment & Plan Note (Signed)
 Has > 20-pack-year smoking history Ordered low-dose CT chest after discussing with the patient.

## 2024-01-27 NOTE — Assessment & Plan Note (Signed)
On statin Checked lipid profile 

## 2024-01-27 NOTE — Assessment & Plan Note (Addendum)
 Started Lamisil PO Has tried Lamisil cream If persistent, will refer to Podiatry

## 2024-01-28 LAB — CMP14+EGFR
ALT: 10 IU/L (ref 0–32)
AST: 13 IU/L (ref 0–40)
Albumin: 4.2 g/dL (ref 3.9–4.9)
Alkaline Phosphatase: 81 IU/L (ref 44–121)
BUN/Creatinine Ratio: 10 — ABNORMAL LOW (ref 12–28)
BUN: 12 mg/dL (ref 8–27)
Bilirubin Total: 0.2 mg/dL (ref 0.0–1.2)
CO2: 25 mmol/L (ref 20–29)
Calcium: 9.9 mg/dL (ref 8.7–10.3)
Chloride: 106 mmol/L (ref 96–106)
Creatinine, Ser: 1.16 mg/dL — ABNORMAL HIGH (ref 0.57–1.00)
Globulin, Total: 2.6 g/dL (ref 1.5–4.5)
Glucose: 112 mg/dL — ABNORMAL HIGH (ref 70–99)
Potassium: 4.2 mmol/L (ref 3.5–5.2)
Sodium: 144 mmol/L (ref 134–144)
Total Protein: 6.8 g/dL (ref 6.0–8.5)
eGFR: 52 mL/min/{1.73_m2} — ABNORMAL LOW (ref >59)

## 2024-01-28 LAB — HEMOGLOBIN A1C
Est. average glucose Bld gHb Est-mCnc: 143 mg/dL
Hgb A1c MFr Bld: 6.6 % — ABNORMAL HIGH (ref 4.8–5.6)

## 2024-02-02 ENCOUNTER — Other Ambulatory Visit: Payer: Self-pay | Admitting: Internal Medicine

## 2024-02-02 ENCOUNTER — Ambulatory Visit (HOSPITAL_COMMUNITY)
Admission: RE | Admit: 2024-02-02 | Discharge: 2024-02-02 | Disposition: A | Discharge status: Home / Self Care | Source: Ambulatory Visit | Attending: Internal Medicine | Admitting: Internal Medicine

## 2024-02-02 DIAGNOSIS — Z780 Asymptomatic menopausal state: Secondary | ICD-10-CM | POA: Insufficient documentation

## 2024-02-02 DIAGNOSIS — E1169 Type 2 diabetes mellitus with other specified complication: Secondary | ICD-10-CM

## 2024-02-02 DIAGNOSIS — B351 Tinea unguium: Secondary | ICD-10-CM

## 2024-03-29 ENCOUNTER — Telehealth: Payer: Self-pay | Admitting: Acute Care

## 2024-03-29 DIAGNOSIS — F17210 Nicotine dependence, cigarettes, uncomplicated: Secondary | ICD-10-CM

## 2024-03-29 DIAGNOSIS — Z122 Encounter for screening for malignant neoplasm of respiratory organs: Secondary | ICD-10-CM

## 2024-03-29 DIAGNOSIS — Z87891 Personal history of nicotine dependence: Secondary | ICD-10-CM

## 2024-03-29 NOTE — Telephone Encounter (Signed)
 Lung Cancer Screening Narrative/Criteria Questionnaire (Cigarette Smokers Only- No Cigars/Pipes/vapes)   Danielle Hicks   SDMV:04/14/2024 2:30 [name]     06/12/1956   LDCT: 04/21/2024 8:00 [facility]    68 y.o.   Phone: [phone]  Lung Screening Narrative (confirm age 59-77 yrs Medicare / 50-80 yrs Private pay insurance)   Insurance information:BCBS mcr   Referring Provider:Dr. Lydia Sams   This screening involves an initial phone call with a team member from our program. It is called a shared decision making visit. The initial meeting is required by  insurance and Medicare to make sure you understand the program. This appointment takes about 15-20 minutes to complete. You will complete the screening scan at your scheduled date/time.  This scan takes about 5-10 minutes to complete. You can eat and drink normally before and after the scan.  Criteria questions for Lung Cancer Screening:   Are you a current or former smoker? Current Age began smoking: 68yo   If you are a former smoker, what year did you quit smoking? N/A(within 15 yrs)   To calculate your smoking history, I need an accurate estimate of how many packs of cigarettes you smoked per day and for how many years. (Not just the number of PPD you are now smoking)   Years smoking 40 x Packs per day 1 = Pack years 40   (at least 20 pack yrs)   (Make sure they understand that we need to know how much they have smoked in the past, not just the number of PPD they are smoking now)  Do you have a personal history of cancer?  No    Do you have a family history of cancer? Yes  (cancer type and and relative) MGM - colon  Are you coughing up blood?  No  Have you had unexplained weight loss of 15 lbs or more in the last 6 months? No  It looks like you meet all criteria.  When would be a good time for us  to schedule you for this screening?   Additional information: N/A

## 2024-03-31 ENCOUNTER — Ambulatory Visit
Admission: EM | Admit: 2024-03-31 | Discharge: 2024-03-31 | Disposition: A | Discharge status: Home / Self Care | Attending: Family Medicine | Admitting: Family Medicine

## 2024-03-31 DIAGNOSIS — J441 Chronic obstructive pulmonary disease with (acute) exacerbation: Secondary | ICD-10-CM | POA: Diagnosis not present

## 2024-03-31 DIAGNOSIS — J0100 Acute maxillary sinusitis, unspecified: Secondary | ICD-10-CM

## 2024-03-31 MED ORDER — ALBUTEROL SULFATE HFA 108 (90 BASE) MCG/ACT IN AERS: 2.0000 | INHALATION_SPRAY | RESPIRATORY_TRACT | 0 refills | Status: AC | PRN

## 2024-03-31 MED ORDER — AZITHROMYCIN 250 MG PO TABS: ORAL_TABLET | ORAL | 0 refills | Status: DC

## 2024-03-31 MED ORDER — FLUTICASONE PROPIONATE 50 MCG/ACT NA SUSP
1.0000 | Freq: Two times a day (BID) | NASAL | 2 refills | Status: AC
Start: 2024-03-31 — End: ?

## 2024-03-31 NOTE — ED Provider Notes (Signed)
 [facility]    CSN: [id number] Arrival date & time: 03/31/24  1207      History   Chief Complaint No chief complaint on file.   HPI Danielle Hicks is a 68 y.o. female.   Patient presenting today with 2-week history of congestion, sinus pressure, sore throat, cough, wheezing, chest tightness, fatigue.  Denies fever, chills, chest pain, abdominal pain, vomiting, diarrhea.  So far trying cold medication with minimal relief.  History of seasonal allergies not currently on regimen for this.  Does also have a history of emphysema not currently on any inhaler regimen.    Past Medical History:  Diagnosis Date   Arthritis    GERD (gastroesophageal reflux disease)    Headache    Hypertension    Type 2 diabetes mellitus (HCC)     Patient Active Problem List   Diagnosis Date Noted   Screening for lung cancer 01/27/2024   Night sweats 07/30/2023   Pulmonary nodule 01/28/2023   Centrilobular emphysema (HCC) 01/28/2023   H/O total hysterectomy 07/31/2022   Vitamin D  deficiency 07/22/2022   HLD (hyperlipidemia) 10/25/2021   Onychomycosis 02/19/2021   Obesity (BMI 30.0-34.9) 10/29/2020   Anemia 01/17/2019   GAD (generalized anxiety disorder) 01/14/2019   Encounter for general adult medical examination with abnormal findings 03/29/2018   S/P total knee replacement, left 01/26/18 02/09/2018   Primary osteoarthritis of left knee    DDD (degenerative disc disease), cervical 01/06/2018   Subjective tinnitus of both ears 03/12/2017   Dyshidrotic eczema 08/20/2015   GERD (gastroesophageal reflux disease) 01/31/2015   Thoracic back pain 12/13/2013   Leg cramps 01/07/2013   Major depressive disorder, recurrent episode (HCC) 01/02/2013   Type 2 diabetes mellitus with other specified complication (HCC) 10/04/2012   Essential hypertension 10/04/2012   Tobacco abuse 10/04/2012    Past Surgical History:  Procedure Laterality Date   ABDOMINAL HYSTERECTOMY      BLEPHAROPLASTY Bilateral    COLONOSCOPY N/A 06/17/2018   Procedure: COLONOSCOPY;  Surgeon: Ruby Corporal, MD;  [facility];  Service: Endoscopy;  Laterality: N/A;  12:25   HEMORRHOID SURGERY     POLYPECTOMY  06/17/2018   Procedure: POLYPECTOMY;  Surgeon: Ruby Corporal, MD;  [facility];  Service: Endoscopy;;  colon   TOTAL KNEE ARTHROPLASTY Left 01/26/2018   Procedure: TOTAL KNEE ARTHROPLASTY;  Surgeon: Darrin Emerald, MD;  Location: [facility];  Service: Orthopedics;  Laterality: Left;    OB History   No obstetric history on file.      Home Medications    Prior to Admission medications   Medication Sig Start Date End Date Taking? Authorizing Provider  albuterol  (VENTOLIN  HFA) 108 (90 Base) MCG/ACT inhaler Inhale 2 puffs into the lungs every 4 (four) hours as needed. 03/31/24  Yes Corbin Dess, PA-C  azithromycin  (ZITHROMAX ) 250 MG tablet Take first 2 tablets together, then 1 every day until finished. 03/31/24  Yes Corbin Dess, PA-C  fluticasone (FLONASE) 50 MCG/ACT nasal spray Place 1 spray into both nostrils 2 (two) times daily. 03/31/24  Yes Corbin Dess, PA-C  amLODipine  (NORVASC ) 10 MG tablet TAKE 1 TABLET BY MOUTH EVERY DAY 01/06/24   Patel, Rutwik K, MD  Blood Glucose Monitoring Suppl (BLOOD GLUCOSE SYSTEM PAK) KIT Please dispense based on patient and insurance preference. Use as directed to monitor FSBS 1x daily. Dx: E11.9 09/28/18   Mathis Som, MD  FARXIGA  10 MG TABS tablet TAKE 1 TABLET BY  MOUTH EVERY DAY 02/02/24   Meldon Sport, MD  hydrOXYzine  (ATARAX /VISTARIL ) 10 MG tablet Take 1 tablet (10 mg total) by mouth 3 (three) times daily as needed. 02/19/21   Meldon Sport, MD  lisinopril -hydrochlorothiazide  (ZESTORETIC ) 10-12.5 MG tablet TAKE 1 TABLET BT MOUTH DAILY 07/20/23   Meldon Sport, MD  metFORMIN  (GLUCOPHAGE -XR) 500 MG 24 hr tablet TAKE 1 TABLET BY MOUTH EVERY DAY WITH BREAKFAST 01/13/24   Patel, Rutwik K, MD   Multiple Vitamin (MULTIVITAMIN WITH MINERALS) TABS tablet Take 1 tablet by mouth 4 (four) times a week. 11/10/23   Meldon Sport, MD  omeprazole  (PRILOSEC) 40 MG capsule Take 1 capsule (40 mg total) by mouth daily. 01/28/23   Meldon Sport, MD  potassium chloride  (KLOR-CON ) 10 MEQ tablet TAKE 1 TABLET(10 MEQ) BY MOUTH DAILY 01/11/20   Mathis Som, MD  pravastatin  (PRAVACHOL ) 20 MG tablet Take 1 tablet (20 mg total) by mouth daily. 11/24/23   Meldon Sport, MD  terbinafine  (LAMISIL ) 250 MG tablet Take 1 tablet (250 mg total) by mouth daily. 01/27/24   Meldon Sport, MD    Family History Family History  Problem Relation Age of Onset   Diabetes Mother    Hypertension Mother    Stroke Sister     Social History Social History   Tobacco Use   Smoking status: Some Days    Current packs/day: 0.50    Average packs/day: 0.5 packs/day for 44.4 years (22.2 ttl pk-yrs)    Types: Cigarettes    Start date: 1981   Smokeless tobacco: Never   Tobacco comments:    trying to quit   Vaping Use   Vaping status: Never Used  Substance Use Topics   Alcohol use: No    Alcohol/week: 0.0 standard drinks of alcohol   Drug use: No     Allergies   Penicillins   Review of Systems Review of Systems Per HPI  Physical Exam Triage Vital Signs ED Triage Vitals  Encounter Vitals Group     BP 03/31/24 1221 (!) 143/93     Systolic BP Percentile --      Diastolic BP Percentile --      Pulse Rate 03/31/24 1221 63     Resp 03/31/24 1221 18     Temp 03/31/24 1221 98.6 F (37 C)     Temp Source 03/31/24 1221 Oral     SpO2 03/31/24 1221 97 %     Weight --      Height --      Head Circumference --      Peak Flow --      Pain Score 03/31/24 1224 0     Pain Loc --      Pain Education --      Exclude from Growth Chart --    No data found.  Updated Vital Signs BP (!) 143/93 (BP Location: Right Arm)   Pulse 63   Temp 98.6 F (37 C) (Oral)   Resp 18   SpO2 97%   Visual Acuity Right  Eye Distance:   Left Eye Distance:   Bilateral Distance:    Right Eye Near:   Left Eye Near:    Bilateral Near:     Physical Exam Vitals and nursing note reviewed.  Constitutional:      Appearance: Normal appearance.  HENT:     Head: Atraumatic.     Right Ear: Tympanic membrane and external ear normal.     Left  Ear: Tympanic membrane and external ear normal.     Nose: Congestion present.     Mouth/Throat:     Mouth: Mucous membranes are moist.     Pharynx: Posterior oropharyngeal erythema present.  Eyes:     Extraocular Movements: Extraocular movements intact.     Conjunctiva/sclera: Conjunctivae normal.  Cardiovascular:     Rate and Rhythm: Normal rate and regular rhythm.     Heart sounds: Normal heart sounds.  Pulmonary:     Effort: Pulmonary effort is normal.     Breath sounds: Normal breath sounds. No wheezing or rales.  Musculoskeletal:        General: Normal range of motion.     Cervical back: Normal range of motion and neck supple.  Skin:    General: Skin is warm and dry.  Neurological:     Mental Status: She is alert and oriented to person, place, and time.  Psychiatric:        Mood and Affect: Mood normal.        Thought Content: Thought content normal.      UC Treatments / Results  Labs (all labs ordered are listed, but only abnormal results are displayed) Labs Reviewed - No data to display  EKG   Radiology No results found.  Procedures Procedures (including critical care time)  Medications Ordered in UC Medications - No data to display  Initial Impression / Assessment and Plan / UC Course  I have reviewed the triage vital signs and the nursing notes.  Pertinent labs & imaging results that were available during my care of the patient were reviewed by me and considered in my medical decision making (see chart for details).     Given duration worsening course will treat with Zithromax , Flonase, antihistamines, Coricidin HBP, and albuterol  as  needed for wheezing and chest tightness secondary to COPD exacerbation.  Discussed supportive home care and return precautions.  Final Clinical Impressions(s) / UC Diagnoses   Final diagnoses:  Acute non-recurrent maxillary sinusitis  COPD exacerbation [facility])   Discharge Instructions   None    ED Prescriptions     Medication Sig Dispense Auth. Provider   albuterol  (VENTOLIN  HFA) 108 (90 Base) MCG/ACT inhaler Inhale 2 puffs into the lungs every 4 (four) hours as needed. 18 g Corbin Dess, PA-C   azithromycin  (ZITHROMAX ) 250 MG tablet Take first 2 tablets together, then 1 every day until finished. 6 tablet Corbin Dess, PA-C   fluticasone (FLONASE) 50 MCG/ACT nasal spray Place 1 spray into both nostrils 2 (two) times daily. 16 g Corbin Dess, [place]      PDMP not reviewed this encounter.   Corbin Dess, [place] 03/31/24 1318

## 2024-03-31 NOTE — ED Triage Notes (Signed)
 Pt reports sore throat , cough, feels like something in the throat is swollen  x 2 weeks, red spot in the left eye onset this morning

## 2024-04-11 ENCOUNTER — Other Ambulatory Visit: Payer: Self-pay | Admitting: Internal Medicine

## 2024-04-11 DIAGNOSIS — E119 Type 2 diabetes mellitus without complications: Secondary | ICD-10-CM

## 2024-04-14 ENCOUNTER — Ambulatory Visit: Admitting: *Deleted

## 2024-04-14 DIAGNOSIS — F17210 Nicotine dependence, cigarettes, uncomplicated: Secondary | ICD-10-CM | POA: Diagnosis not present

## 2024-04-14 NOTE — Progress Notes (Signed)
 Virtual Visit via Telephone Note  I connected with Danielle Hicks on 04/14/24 at  2:30 PM EDT by telephone and verified that I am speaking with the correct person using two identifiers.  Location: Patient: in home Provider: [address], [place], [place], [address]    Shared Decision Making Visit Lung Cancer Screening Program [phone])   Eligibility: Age 68 y.o. Pack Years Smoking History Calculation 40 (# packs/per year x # years smoked) Recent History of coughing up blood  no Unexplained weight loss? no ( >Than 15 pounds within the last 6 months ) Prior History Lung / other cancer no (Diagnosis within the last 5 years already requiring surveillance chest CT Scans). Smoking Status Current Smoker Former Smokers: Years since quit:  NA  Quit Date: NA  Visit Components: Discussion included one or more decision making aids. yes Discussion included risk/benefits of screening. yes Discussion included potential follow up diagnostic testing for abnormal scans. yes Discussion included meaning and risk of over diagnosis. yes Discussion included meaning and risk of False Positives. yes Discussion included meaning of total radiation exposure. yes  Counseling Included: Importance of adherence to annual lung cancer LDCT screening. yes Impact of comorbidities on ability to participate in the program. yes Ability and willingness to under diagnostic treatment. yes  Smoking Cessation Counseling: Current Smokers:  Discussed importance of smoking cessation. yes Information about tobacco cessation classes and interventions provided to patient. yes Patient provided with "ticket" for LDCT Scan. yes Symptomatic Patient. no  Counseling NA Diagnosis Code: Tobacco Use Z72.0 Asymptomatic Patient yes  Counseling (Intermediate counseling: > three minutes counseling) [id number] Former Smokers:  Discussed the importance of maintaining cigarette abstinence. yes Diagnosis Code: Personal History  of Nicotine  Dependence. Q46.962 Information about tobacco cessation classes and interventions provided to patient. Yes Patient provided with "ticket" for LDCT Scan. yes Written Order for Lung Cancer Screening with LDCT placed in Epic. Yes (CT Chest Lung Cancer Screening Low Dose W/O CM) [id number] Z12.2-Screening of respiratory organs Z87.891-Personal history of nicotine  dependence   Valentin Gaskins, RN 04/14/24

## 2024-04-14 NOTE — Patient Instructions (Signed)

## 2024-04-17 ENCOUNTER — Other Ambulatory Visit: Payer: Self-pay | Admitting: Internal Medicine

## 2024-04-17 DIAGNOSIS — B351 Tinea unguium: Secondary | ICD-10-CM

## 2024-04-18 ENCOUNTER — Telehealth: Payer: Self-pay | Admitting: Pharmacist

## 2024-04-18 ENCOUNTER — Telehealth: Payer: Self-pay | Admitting: Pharmacy Technician

## 2024-04-18 ENCOUNTER — Other Ambulatory Visit (HOSPITAL_COMMUNITY): Payer: Self-pay

## 2024-04-18 NOTE — Telephone Encounter (Signed)
 An E-Appeal has been submitted for terbinafine . Will advise when response is received or follow up in 1 week. Please be advised that most companies may take 30 days to make a decision. Appeal letter and supporting documentation were uploaded and submitted via CMM on 04/18/2024 @4 :23 pm.  Thank you, Dene Fines, PharmD Clinical Pharmacist  [facility]  Direct Dial: [phone]

## 2024-04-18 NOTE — Telephone Encounter (Signed)
 Pharmacy Patient Advocate Encounter   Received notification from CoverMyMeds that prior authorization for Terbinafine  HCl 250MG  tablets is required/requested.   Insurance verification completed.   The patient is insured through [facility] .   Per test claim: PA required; PA submitted to above mentioned insurance via CoverMyMeds Key/confirmation #/EOC [id number] Status is pending

## 2024-04-18 NOTE — Telephone Encounter (Signed)
 Pharmacy Patient Advocate Encounter  Received notification from [facility] that Prior Authorization for Terbinafine  HCl 250MG  tablets has been DENIED.  Full denial letter will be uploaded to the media tab. See denial reason below.   PA #/Case ID/Reference #:  [id number]   An appeal will be filed due to question was tricky.

## 2024-04-19 ENCOUNTER — Other Ambulatory Visit (HOSPITAL_COMMUNITY): Payer: Self-pay

## 2024-04-19 NOTE — Telephone Encounter (Signed)
 Insurance has approved the appeal for Terbinafine :    Thank you, Dene Fines, PharmD Clinical Pharmacist  [place]  Direct Dial: [phone]

## 2024-04-20 ENCOUNTER — Ambulatory Visit

## 2024-04-20 DIAGNOSIS — E1169 Type 2 diabetes mellitus with other specified complication: Secondary | ICD-10-CM

## 2024-04-20 DIAGNOSIS — E119 Type 2 diabetes mellitus without complications: Secondary | ICD-10-CM

## 2024-04-20 LAB — HM DIABETES EYE EXAM

## 2024-04-20 NOTE — Progress Notes (Signed)
 Danielle Hicks arrived 04/20/2024 and has given verbal consent to obtain images and complete their overdue diabetic retinal screening.  The images have been sent to an ophthalmologist or optometrist for review and interpretation.  Results will be sent back to Meldon Sport, MD for review.  Patient has been informed they will be contacted when we receive the results via telephone or MyChart

## 2024-04-21 ENCOUNTER — Ambulatory Visit (HOSPITAL_COMMUNITY)
Admission: RE | Admit: 2024-04-21 | Discharge: 2024-04-21 | Disposition: A | Discharge status: Home / Self Care | Source: Ambulatory Visit | Attending: Acute Care | Admitting: Acute Care

## 2024-04-21 DIAGNOSIS — F17210 Nicotine dependence, cigarettes, uncomplicated: Secondary | ICD-10-CM | POA: Insufficient documentation

## 2024-04-21 DIAGNOSIS — Z87891 Personal history of nicotine dependence: Secondary | ICD-10-CM | POA: Insufficient documentation

## 2024-04-21 DIAGNOSIS — Z122 Encounter for screening for malignant neoplasm of respiratory organs: Secondary | ICD-10-CM | POA: Insufficient documentation

## 2024-05-03 ENCOUNTER — Other Ambulatory Visit: Payer: Self-pay | Admitting: Acute Care

## 2024-05-03 DIAGNOSIS — Z87891 Personal history of nicotine dependence: Secondary | ICD-10-CM

## 2024-05-03 DIAGNOSIS — Z122 Encounter for screening for malignant neoplasm of respiratory organs: Secondary | ICD-10-CM

## 2024-05-03 DIAGNOSIS — F17210 Nicotine dependence, cigarettes, uncomplicated: Secondary | ICD-10-CM

## 2024-05-19 ENCOUNTER — Other Ambulatory Visit: Payer: Self-pay | Admitting: Internal Medicine

## 2024-05-19 DIAGNOSIS — E7800 Pure hypercholesterolemia, unspecified: Secondary | ICD-10-CM

## 2024-07-08 ENCOUNTER — Other Ambulatory Visit: Payer: Self-pay | Admitting: Internal Medicine

## 2024-07-11 ENCOUNTER — Other Ambulatory Visit: Payer: Self-pay | Admitting: Internal Medicine

## 2024-07-11 DIAGNOSIS — B351 Tinea unguium: Secondary | ICD-10-CM

## 2024-07-15 ENCOUNTER — Other Ambulatory Visit: Payer: Self-pay | Admitting: Internal Medicine

## 2024-07-15 DIAGNOSIS — E119 Type 2 diabetes mellitus without complications: Secondary | ICD-10-CM

## 2024-07-28 ENCOUNTER — Encounter: Payer: Self-pay | Admitting: Internal Medicine

## 2024-07-28 ENCOUNTER — Ambulatory Visit: Admitting: Internal Medicine

## 2024-07-28 VITALS — BP 127/85 | HR 65 | Ht 61.0 in | Wt 144.8 lb

## 2024-07-28 DIAGNOSIS — E1169 Type 2 diabetes mellitus with other specified complication: Secondary | ICD-10-CM | POA: Diagnosis not present

## 2024-07-28 DIAGNOSIS — E782 Mixed hyperlipidemia: Secondary | ICD-10-CM

## 2024-07-28 DIAGNOSIS — I10 Essential (primary) hypertension: Secondary | ICD-10-CM

## 2024-07-28 DIAGNOSIS — Z720 Tobacco use: Secondary | ICD-10-CM

## 2024-07-28 DIAGNOSIS — Z7984 Long term (current) use of oral hypoglycemic drugs: Secondary | ICD-10-CM

## 2024-07-28 DIAGNOSIS — J432 Centrilobular emphysema: Secondary | ICD-10-CM | POA: Diagnosis not present

## 2024-07-28 DIAGNOSIS — K219 Gastro-esophageal reflux disease without esophagitis: Secondary | ICD-10-CM

## 2024-07-28 DIAGNOSIS — R7989 Other specified abnormal findings of blood chemistry: Secondary | ICD-10-CM

## 2024-07-28 DIAGNOSIS — E559 Vitamin D deficiency, unspecified: Secondary | ICD-10-CM

## 2024-07-28 MED ORDER — OMEPRAZOLE 40 MG PO CPDR: 40.0000 mg | DELAYED_RELEASE_CAPSULE | Freq: Every day | ORAL | 5 refills | Status: AC

## 2024-07-28 NOTE — Assessment & Plan Note (Signed)
  BP Readings from Last 1 Encounters:  07/28/24 127/85   Well-controlled with Amlodipine  10 mg QD and Zestoretic  10-12.5 mg QD Counseled for compliance with the medications Advised DASH diet and moderate exercise/walking, at least 150 mins/week

## 2024-07-28 NOTE — Assessment & Plan Note (Signed)
On statin Checked lipid profile 

## 2024-07-28 NOTE — Assessment & Plan Note (Signed)
 Lab Results  Component Value Date   HGBA1C 6.6 (H) 01/27/2024   Well-controlled Associated with HTN, HLD and GERD On Metformin  500 mg QD and Farxiga  10 mg QD Advised to follow diabetic diet On statin and ACEi Diabetic eye exam: Advised to follow up with Ophthalmology for diabetic eye exam

## 2024-07-28 NOTE — Assessment & Plan Note (Signed)
Noted on CT chest Currently asymptomatic Needs to cut down -> quit smoking

## 2024-07-28 NOTE — Patient Instructions (Addendum)
 Please schedule Medicare annual wellness.  Please continue to take medications as prescribed.  Please continue to follow low carb diet and perform moderate exercise/walking at least 150 mins/week.

## 2024-07-28 NOTE — Progress Notes (Signed)
 Counseled patient 4 minutes regarding tobacco use.

## 2024-07-28 NOTE — Progress Notes (Unsigned)
 Established Patient Office Visit  Subjective:  Patient ID: Danielle Hicks, female    DOB: 1956-09-25  Age: 68 y.o. MRN: [id number]  CC:  Chief Complaint  Patient presents with   Gastroesophageal Reflux    Reports sx of acid reflux at times.    Diabetes   Hypertension    HPI Danielle Hicks is a 68 y.o. female with past medical history of HTN, type 2 DM, GERD and tobacco abuse who presents for f/u of her chronic medical conditions.  DM: Her HbA1C was 6.6 in 03/25, which was better compared to prior. She has been taking Metformin  and Farxiga  regularly. She checks her blood glucose regularly, ranges around 100-120. She states that she needs to improve her diet still, but has tried to cut down on pancakes and soda.   HTN: BP is well-controlled. Takes medications regularly. Patient denies headache, dizziness, chest pain, dyspnea or palpitations.   GERD: She used to take omeprazole  40 mg, which has improved her heartburn and bloating. She takes Pepto-Bismol PRN currently. She currently denies any dysphagia or odynophagia.  She reports night sweats recently due to sudden weather changes.  Denies any chronic cough, hemoptysis, weight loss, fever, chills, daytime sweating or LAD.    Past Medical History:  Diagnosis Date   Arthritis    GERD (gastroesophageal reflux disease)    Headache    Hypertension    Type 2 diabetes mellitus (HCC)     Past Surgical History:  Procedure Laterality Date   ABDOMINAL HYSTERECTOMY     BLEPHAROPLASTY Bilateral    COLONOSCOPY N/A 06/17/2018   Procedure: COLONOSCOPY;  Surgeon: Golda Claudis PENNER, MD;  Location: [facility];  Service: Endoscopy;  Laterality: N/A;  12:25   HEMORRHOID SURGERY     POLYPECTOMY  06/17/2018   Procedure: POLYPECTOMY;  Surgeon: Golda Claudis PENNER, MD;  Location: [facility];  Service: Endoscopy;;  colon   TOTAL KNEE ARTHROPLASTY Left 01/26/2018   Procedure: TOTAL KNEE ARTHROPLASTY;  Surgeon: Margrette Taft FORBES, MD;   Location: [facility];  Service: Orthopedics;  Laterality: Left;    Family History  Problem Relation Age of Onset   Diabetes Mother    Hypertension Mother    Stroke Sister     Social History   Socioeconomic History   Marital status: Married    Spouse name: Not on file   Number of children: Not on file   Years of education: Not on file   Highest education level: Not on file  Occupational History   Not on file  Tobacco Use   Smoking status: Some Days    Current packs/day: 0.50    Average packs/day: 0.5 packs/day for 44.7 years (22.4 ttl pk-yrs)    Types: Cigarettes    Start date: 47   Smokeless tobacco: Never   Tobacco comments:    trying to quit   Vaping Use   Vaping status: Never Used  Substance and Sexual Activity   Alcohol use: No    Alcohol/week: 0.0 standard drinks of alcohol   Drug use: No   Sexual activity: Yes  Other Topics Concern   Not on file  Social History Narrative   Not on file   Social Drivers of Health   Financial Resource Strain: Not on file  Food Insecurity: Not on file  Transportation Needs: Not on file  Physical Activity: Not on file  Stress: Not on file  Social Connections: Not on file  Intimate Partner Violence: Not on file  Outpatient Medications Prior to Visit  Medication Sig Dispense Refill   albuterol  (VENTOLIN  HFA) 108 (90 Base) MCG/ACT inhaler Inhale 2 puffs into the lungs every 4 (four) hours as needed. 18 g 0   amLODipine  (NORVASC ) 10 MG tablet TAKE 1 TABLET BY MOUTH EVERY DAY 90 tablet 2   Blood Glucose Monitoring Suppl (BLOOD GLUCOSE SYSTEM PAK) KIT Please dispense based on patient and insurance preference. Use as directed to monitor FSBS 1x daily. Dx: E11.9 1 each 1   FARXIGA  10 MG TABS tablet TAKE 1 TABLET BY MOUTH EVERY DAY 30 tablet 11   fluticasone  (FLONASE ) 50 MCG/ACT nasal spray Place 1 spray into both nostrils 2 (two) times daily. 16 g 2   hydrOXYzine  (ATARAX /VISTARIL ) 10 MG tablet Take 1 tablet (10 mg total) by mouth 3  (three) times daily as needed. 30 tablet 0   lisinopril -hydrochlorothiazide  (ZESTORETIC ) 10-12.5 MG tablet TAKE 1 TABLET BT MOUTH DAILY 90 tablet 3   metFORMIN  (GLUCOPHAGE -XR) 500 MG 24 hr tablet TAKE 1 TABLET BY MOUTH EVERY DAY WITH BREAKFAST 90 tablet 0   Multiple Vitamin (MULTIVITAMIN WITH MINERALS) TABS tablet Take 1 tablet by mouth 4 (four) times a week. 16 tablet 0   potassium chloride  (KLOR-CON ) 10 MEQ tablet TAKE 1 TABLET(10 MEQ) BY MOUTH DAILY 90 tablet 3   pravastatin  (PRAVACHOL ) 20 MG tablet TAKE 1 TABLET BY MOUTH EVERY DAY 90 tablet 1   azithromycin  (ZITHROMAX ) 250 MG tablet Take first 2 tablets together, then 1 every day until finished. 6 tablet 0   omeprazole  (PRILOSEC) 40 MG capsule Take 1 capsule (40 mg total) by mouth daily. 30 capsule 5   terbinafine  (LAMISIL ) 250 MG tablet TAKE 1 TABLET BY MOUTH EVERY DAY 84 tablet 0   No facility-administered medications prior to visit.    Allergies  Allergen Reactions   Penicillins Rash     Has patient had a PCN reaction causing immediate rash, facial/tongue/throat swelling, SOB or lightheadedness with hypotension: Yes Has patient had a PCN reaction causing severe rash involving mucus membranes or skin necrosis: No Has patient had a PCN reaction that required hospitalization: No Has patient had a PCN reaction occurring within the last 10 years: No If all of the above answers are NO, then may proceed with Cephalosporin use.     ROS Review of Systems  Constitutional:  Negative for chills and fever.  HENT:  Negative for congestion, sinus pressure, sinus pain and sore throat.   Eyes:  Negative for pain and discharge.  Respiratory:  Negative for cough and shortness of breath.   Cardiovascular:  Negative for chest pain and palpitations.  Gastrointestinal:  Negative for abdominal pain, constipation, nausea and vomiting.  Endocrine: Negative for polydipsia and polyuria.  Genitourinary:  Negative for dysuria and hematuria.   Musculoskeletal:  Positive for arthralgias. Negative for neck pain and neck stiffness.  Skin:  Negative for rash.  Neurological:  Negative for dizziness and weakness.  Psychiatric/Behavioral:  Negative for agitation and behavioral problems.       Objective:    Physical Exam Vitals reviewed.  Constitutional:      General: She is not in acute distress.    Appearance: She is not diaphoretic.  HENT:     Head: Normocephalic and atraumatic.     Nose: Nose normal.     Mouth/Throat:     Mouth: Mucous membranes are moist.  Eyes:     General: No scleral icterus.    Extraocular Movements: Extraocular movements intact.  Cardiovascular:  Rate and Rhythm: Normal rate and regular rhythm.     Pulses: Normal pulses.     Heart sounds: Normal heart sounds. No murmur heard. Pulmonary:     Breath sounds: Normal breath sounds. No wheezing or rales.  Musculoskeletal:     Cervical back: Neck supple. No tenderness.     Right lower leg: No edema.     Left lower leg: No edema.  Feet:     Right foot:     Toenail Condition: Fungal disease present. Skin:    General: Skin is warm.     Findings: No rash.  Neurological:     General: No focal deficit present.     Mental Status: She is alert and oriented to person, place, and time.     Cranial Nerves: No cranial nerve deficit.     Sensory: No sensory deficit.     Motor: No weakness.  Psychiatric:        Mood and Affect: Mood normal.        Behavior: Behavior normal.     BP 127/85   Pulse 65   Ht 5' 1 (1.549 m)   Wt 144 lb 12.8 oz (65.7 kg)   SpO2 99%   BMI 27.36 kg/m  Wt Readings from Last 3 Encounters:  07/28/24 144 lb 12.8 oz (65.7 kg)  01/27/24 146 lb 3.2 oz (66.3 kg)  07/30/23 144 lb 9.6 oz (65.6 kg)    Lab Results  Component Value Date   TSH 1.260 07/30/2023   Lab Results  Component Value Date   WBC 7.8 07/30/2023   HGB 13.9 07/30/2023   HCT 42.0 07/30/2023   MCV 83 07/30/2023   PLT 343 07/30/2023   Lab Results   Component Value Date   NA 144 01/27/2024   K 4.2 01/27/2024   CO2 25 01/27/2024   GLUCOSE 112 (H) 01/27/2024   BUN 12 01/27/2024   CREATININE 1.16 (H) 01/27/2024   BILITOT 0.2 01/27/2024   ALKPHOS 81 01/27/2024   AST 13 01/27/2024   ALT 10 01/27/2024   PROT 6.8 01/27/2024   ALBUMIN 4.2 01/27/2024   CALCIUM 9.9 01/27/2024   ANIONGAP 8 09/18/2018   EGFR 52 (L) 01/27/2024   Lab Results  Component Value Date   CHOL 190 07/30/2023   Lab Results  Component Value Date   HDL 84 07/30/2023   Lab Results  Component Value Date   LDLCALC 90 07/30/2023   Lab Results  Component Value Date   TRIG 93 07/30/2023   Lab Results  Component Value Date   CHOLHDL 2.3 07/30/2023   Lab Results  Component Value Date   HGBA1C 6.6 (H) 01/27/2024      Assessment & Plan:   Problem List Items Addressed This Visit       Cardiovascular and Mediastinum   Essential hypertension    BP Readings from Last 1 Encounters:  07/28/24 127/85   Well-controlled with Amlodipine  10 mg QD and Zestoretic  10-12.5 mg QD Counseled for compliance with the medications Advised DASH diet and moderate exercise/walking, at least 150 mins/week        Respiratory   Centrilobular emphysema (HCC)   Noted on CT chest Currently asymptomatic Needs to cut down -> quit smoking        Digestive   GERD (gastroesophageal reflux disease)   Well-controlled, advised to take omeprazole  as needed Avoid hot and spicy food      Relevant Medications   omeprazole  (PRILOSEC) 40 MG capsule  Endocrine   Type 2 diabetes mellitus with other specified complication (HCC)   Lab Results  Component Value Date   HGBA1C 6.6 (H) 01/27/2024   Well-controlled Associated with HTN, HLD and GERD On Metformin  500 mg QD and Farxiga  10 mg QD Advised to follow diabetic diet On statin and ACEi Diabetic eye exam: Advised to follow up with Ophthalmology for diabetic eye exam        Other   Tobacco abuse (Chronic)   Smokes  0.5 pack/day  Asked about quitting: confirms that she currently smokes cigarettes Advise to quit smoking: Educated about QUITTING to reduce the risk of cancer, cardio and cerebrovascular disease. Assess willingness: Unwilling to quit at this time, but is working on cutting back. Assist with counseling and pharmacotherapy: Counseled for 5 minutes and literature provided. Arrange for follow up: Follow up in 3 months and continue to offer help.      HLD (hyperlipidemia)   On statin Checked lipid profile      Other Visit Diagnoses       Type 2 diabetes mellitus without complication, without long-term current use of insulin  (HCC)    -  Primary   Relevant Orders   Microalbumin / creatinine urine ratio        Meds ordered this encounter  Medications   omeprazole  (PRILOSEC) 40 MG capsule    Sig: Take 1 capsule (40 mg total) by mouth daily.    Dispense:  30 capsule    Refill:  5    Follow-up: Return in about 6 months (around 01/25/2025).    Suzzane MARLA Blanch, MD

## 2024-07-28 NOTE — Assessment & Plan Note (Signed)
 Worse recently, advised to take omeprazole  as needed Avoid hot and spicy food

## 2024-07-28 NOTE — Assessment & Plan Note (Signed)
Smokes 0.5 pack/day  Asked about quitting: confirms that she currently smokes cigarettes Advise to quit smoking: Educated about QUITTING to reduce the risk of cancer, cardio and cerebrovascular disease. Assess willingness: Unwilling to quit at this time, but is working on cutting back. Assist with counseling and pharmacotherapy: Counseled for 5 minutes and literature provided. Arrange for follow up: Follow up in 3 months and continue to offer help.

## 2024-07-29 ENCOUNTER — Ambulatory Visit: Payer: Self-pay | Admitting: Internal Medicine

## 2024-07-29 LAB — CMP14+EGFR
ALT: 14 IU/L (ref 0–32)
AST: 20 IU/L (ref 0–40)
Albumin: 4.5 g/dL (ref 3.9–4.9)
Alkaline Phosphatase: 75 IU/L (ref 49–135)
BUN/Creatinine Ratio: 11 — ABNORMAL LOW (ref 12–28)
BUN: 9 mg/dL (ref 8–27)
Bilirubin Total: 0.5 mg/dL (ref 0.0–1.2)
CO2: 22 mmol/L (ref 20–29)
Calcium: 9.8 mg/dL (ref 8.7–10.3)
Chloride: 102 mmol/L (ref 96–106)
Creatinine, Ser: 0.84 mg/dL (ref 0.57–1.00)
Globulin, Total: 2.7 g/dL (ref 1.5–4.5)
Glucose: 100 mg/dL — ABNORMAL HIGH (ref 70–99)
Potassium: 4.3 mmol/L (ref 3.5–5.2)
Sodium: 141 mmol/L (ref 134–144)
Total Protein: 7.2 g/dL (ref 6.0–8.5)
eGFR: 77 mL/min/1.73 (ref >59)

## 2024-07-29 LAB — LIPID PANEL
Chol/HDL Ratio: 2.3 ratio (ref 0.0–4.4)
Cholesterol, Total: 200 mg/dL — ABNORMAL HIGH (ref 100–199)
HDL: 86 mg/dL (ref >39)
LDL Chol Calc (NIH): 97 mg/dL (ref 0–99)
Triglycerides: 96 mg/dL (ref 0–149)
VLDL Cholesterol Cal: 17 mg/dL (ref 5–40)

## 2024-07-29 LAB — TSH: TSH: 1.39 u[IU]/mL (ref 0.450–4.500)

## 2024-07-29 LAB — CBC WITH DIFFERENTIAL/PLATELET
Basophils Absolute: 0 x10E3/uL (ref 0.0–0.2)
Basos: 0 %
EOS (ABSOLUTE): 0.1 x10E3/uL (ref 0.0–0.4)
Eos: 1 %
Hematocrit: 41.3 % (ref 34.0–46.6)
Hemoglobin: 13.5 g/dL (ref 11.1–15.9)
Immature Grans (Abs): 0 x10E3/uL (ref 0.0–0.1)
Immature Granulocytes: 0 %
Lymphocytes Absolute: 2.5 x10E3/uL (ref 0.7–3.1)
Lymphs: 31 %
MCH: 27.7 pg (ref 26.6–33.0)
MCHC: 32.7 g/dL (ref 31.5–35.7)
MCV: 85 fL (ref 79–97)
Monocytes Absolute: 0.5 x10E3/uL (ref 0.1–0.9)
Monocytes: 6 %
Neutrophils Absolute: 4.9 x10E3/uL (ref 1.4–7.0)
Neutrophils: 62 %
Platelets: 343 x10E3/uL (ref 150–450)
RBC: 4.87 x10E6/uL (ref 3.77–5.28)
RDW: 15.7 % — ABNORMAL HIGH (ref 11.7–15.4)
WBC: 8 x10E3/uL (ref 3.4–10.8)

## 2024-07-29 LAB — HEMOGLOBIN A1C
Est. average glucose Bld gHb Est-mCnc: 134 mg/dL
Hgb A1c MFr Bld: 6.3 % — ABNORMAL HIGH (ref 4.8–5.6)

## 2024-07-29 LAB — VITAMIN D 25 HYDROXY (VIT D DEFICIENCY, FRACTURES): Vit D, 25-Hydroxy: 46.3 ng/mL (ref 30.0–100.0)

## 2024-07-29 NOTE — Assessment & Plan Note (Signed)
 Last vitamin D  Lab Results  Component Value Date   VD25OH 46.3 07/28/2024   On Vitamin D  2000 IU QD

## 2024-07-29 NOTE — Assessment & Plan Note (Signed)
 Last CMP showed elevated S. Cr. and low GFR, likely due to dehydration Advised to maintain at least 64 ounces of fluid intake daily Recheck CMP

## 2024-07-30 LAB — MICROALBUMIN / CREATININE URINE RATIO
Creatinine, Urine: 86.5 mg/dL
Microalb/Creat Ratio: 4 mg/g{creat} (ref 0–29)
Microalbumin, Urine: 3.6 ug/mL

## 2024-08-01 ENCOUNTER — Ambulatory Visit (INDEPENDENT_AMBULATORY_CARE_PROVIDER_SITE_OTHER)

## 2024-08-01 DIAGNOSIS — Z23 Encounter for immunization: Secondary | ICD-10-CM | POA: Diagnosis not present

## 2024-08-01 NOTE — Progress Notes (Signed)
 Patient is in office today for a nurse visit for Immunization. Patient Injection was given in the  Right deltoid. Patient tolerated injection well.

## 2024-08-15 ENCOUNTER — Ambulatory Visit: Payer: Self-pay | Admitting: Internal Medicine

## 2024-08-24 ENCOUNTER — Encounter (INDEPENDENT_AMBULATORY_CARE_PROVIDER_SITE_OTHER): Payer: Self-pay | Admitting: Gastroenterology

## 2024-09-29 ENCOUNTER — Other Ambulatory Visit: Payer: Self-pay | Admitting: Internal Medicine

## 2024-09-29 DIAGNOSIS — E119 Type 2 diabetes mellitus without complications: Secondary | ICD-10-CM

## 2024-10-12 ENCOUNTER — Other Ambulatory Visit: Payer: Self-pay | Admitting: Internal Medicine

## 2024-10-12 DIAGNOSIS — E119 Type 2 diabetes mellitus without complications: Secondary | ICD-10-CM

## 2024-11-16 ENCOUNTER — Other Ambulatory Visit: Payer: Self-pay | Admitting: Internal Medicine

## 2024-11-16 DIAGNOSIS — E7800 Pure hypercholesterolemia, unspecified: Secondary | ICD-10-CM

## 2024-11-25 ENCOUNTER — Other Ambulatory Visit: Payer: Self-pay | Admitting: Internal Medicine

## 2024-11-25 DIAGNOSIS — E1169 Type 2 diabetes mellitus with other specified complication: Secondary | ICD-10-CM

## 2025-01-25 ENCOUNTER — Ambulatory Visit: Admitting: Internal Medicine
